# Patient Record
Sex: Male | Born: 1940 | Race: White | Hispanic: No | Smoking: Never smoker
Health system: Southern US, Community
[De-identification: ages and names within clinical notes are randomized; demographics above are authoritative.]

## PROBLEM LIST (undated history)

## (undated) DIAGNOSIS — E7800 Pure hypercholesterolemia, unspecified: Secondary | ICD-10-CM

## (undated) DIAGNOSIS — H409 Unspecified glaucoma: Secondary | ICD-10-CM

## (undated) DIAGNOSIS — I10 Essential (primary) hypertension: Secondary | ICD-10-CM

## (undated) HISTORY — PX: WRIST FRACTURE SURGERY: SHX121

---

## 2020-08-19 ENCOUNTER — Inpatient Hospital Stay: Payer: Self-pay

## 2020-08-19 ENCOUNTER — Other Ambulatory Visit: Payer: Self-pay

## 2020-08-19 ENCOUNTER — Inpatient Hospital Stay (HOSPITAL_COMMUNITY)
Admission: AD | Admit: 2020-08-19 | Discharge: 2020-08-30 | DRG: 235 | Disposition: A | Discharge status: Home / Self Care | Payer: Medicare Other | Source: Other Acute Inpatient Hospital | Attending: Surgery | Admitting: Surgery

## 2020-08-19 ENCOUNTER — Inpatient Hospital Stay (HOSPITAL_COMMUNITY): Payer: Medicare Other

## 2020-08-19 ENCOUNTER — Encounter (HOSPITAL_COMMUNITY): Payer: Self-pay | Admitting: Cardiology

## 2020-08-19 DIAGNOSIS — D62 Acute posthemorrhagic anemia: Secondary | ICD-10-CM | POA: Diagnosis not present

## 2020-08-19 DIAGNOSIS — Z681 Body mass index (BMI) 19 or less, adult: Secondary | ICD-10-CM | POA: Diagnosis not present

## 2020-08-19 DIAGNOSIS — I5020 Unspecified systolic (congestive) heart failure: Secondary | ICD-10-CM | POA: Diagnosis not present

## 2020-08-19 DIAGNOSIS — H409 Unspecified glaucoma: Secondary | ICD-10-CM | POA: Diagnosis present

## 2020-08-19 DIAGNOSIS — R110 Nausea: Secondary | ICD-10-CM | POA: Diagnosis not present

## 2020-08-19 DIAGNOSIS — E785 Hyperlipidemia, unspecified: Secondary | ICD-10-CM | POA: Diagnosis present

## 2020-08-19 DIAGNOSIS — I110 Hypertensive heart disease with heart failure: Secondary | ICD-10-CM | POA: Diagnosis present

## 2020-08-19 DIAGNOSIS — I2584 Coronary atherosclerosis due to calcified coronary lesion: Secondary | ICD-10-CM | POA: Diagnosis present

## 2020-08-19 DIAGNOSIS — N400 Enlarged prostate without lower urinary tract symptoms: Secondary | ICD-10-CM | POA: Diagnosis present

## 2020-08-19 DIAGNOSIS — Z01810 Encounter for preprocedural cardiovascular examination: Secondary | ICD-10-CM | POA: Diagnosis not present

## 2020-08-19 DIAGNOSIS — I25110 Atherosclerotic heart disease of native coronary artery with unstable angina pectoris: Principal | ICD-10-CM

## 2020-08-19 DIAGNOSIS — Z4682 Encounter for fitting and adjustment of non-vascular catheter: Secondary | ICD-10-CM

## 2020-08-19 DIAGNOSIS — K5900 Constipation, unspecified: Secondary | ICD-10-CM | POA: Diagnosis not present

## 2020-08-19 DIAGNOSIS — M96831 Postprocedural hemorrhage and hematoma of a musculoskeletal structure following other procedure: Secondary | ICD-10-CM | POA: Diagnosis not present

## 2020-08-19 DIAGNOSIS — R0902 Hypoxemia: Secondary | ICD-10-CM | POA: Diagnosis present

## 2020-08-19 DIAGNOSIS — I509 Heart failure, unspecified: Secondary | ICD-10-CM

## 2020-08-19 DIAGNOSIS — E119 Type 2 diabetes mellitus without complications: Secondary | ICD-10-CM | POA: Diagnosis present

## 2020-08-19 DIAGNOSIS — T8089XA Other complications following infusion, transfusion and therapeutic injection, initial encounter: Secondary | ICD-10-CM | POA: Diagnosis not present

## 2020-08-19 DIAGNOSIS — E440 Moderate protein-calorie malnutrition: Secondary | ICD-10-CM | POA: Diagnosis present

## 2020-08-19 DIAGNOSIS — J9383 Other pneumothorax: Secondary | ICD-10-CM | POA: Diagnosis not present

## 2020-08-19 DIAGNOSIS — Z951 Presence of aortocoronary bypass graft: Principal | ICD-10-CM

## 2020-08-19 DIAGNOSIS — I5041 Acute combined systolic (congestive) and diastolic (congestive) heart failure: Secondary | ICD-10-CM | POA: Diagnosis present

## 2020-08-19 DIAGNOSIS — I9589 Other hypotension: Secondary | ICD-10-CM | POA: Diagnosis not present

## 2020-08-19 DIAGNOSIS — I2510 Atherosclerotic heart disease of native coronary artery without angina pectoris: Secondary | ICD-10-CM

## 2020-08-19 DIAGNOSIS — Y832 Surgical operation with anastomosis, bypass or graft as the cause of abnormal reaction of the patient, or of later complication, without mention of misadventure at the time of the procedure: Secondary | ICD-10-CM | POA: Diagnosis not present

## 2020-08-19 DIAGNOSIS — I4891 Unspecified atrial fibrillation: Secondary | ICD-10-CM | POA: Diagnosis not present

## 2020-08-19 DIAGNOSIS — J939 Pneumothorax, unspecified: Secondary | ICD-10-CM

## 2020-08-19 DIAGNOSIS — Z79899 Other long term (current) drug therapy: Secondary | ICD-10-CM

## 2020-08-19 DIAGNOSIS — I255 Ischemic cardiomyopathy: Secondary | ICD-10-CM | POA: Diagnosis present

## 2020-08-19 DIAGNOSIS — J90 Pleural effusion, not elsewhere classified: Secondary | ICD-10-CM | POA: Diagnosis not present

## 2020-08-19 DIAGNOSIS — Z9889 Other specified postprocedural states: Secondary | ICD-10-CM

## 2020-08-19 DIAGNOSIS — E7800 Pure hypercholesterolemia, unspecified: Secondary | ICD-10-CM | POA: Diagnosis present

## 2020-08-19 DIAGNOSIS — I97611 Postprocedural hemorrhage and hematoma of a circulatory system organ or structure following cardiac bypass: Secondary | ICD-10-CM | POA: Diagnosis not present

## 2020-08-19 DIAGNOSIS — R0602 Shortness of breath: Principal | ICD-10-CM

## 2020-08-19 HISTORY — DX: Essential (primary) hypertension: I10

## 2020-08-19 HISTORY — DX: Pure hypercholesterolemia, unspecified: E78.00

## 2020-08-19 HISTORY — DX: Unspecified glaucoma: H40.9

## 2020-08-19 HISTORY — PX: CARDIAC CATHETERIZATION: SHX172

## 2020-08-19 LAB — CBC WITH DIFFERENTIAL/PLATELET
Abs Immature Granulocytes: 0.04 10*3/uL (ref 0.00–0.07)
Basophils Absolute: 0 10*3/uL (ref 0.0–0.1)
Basophils Relative: 0 %
Eosinophils Absolute: 0.2 10*3/uL (ref 0.0–0.5)
Eosinophils Relative: 2 %
HCT: 44.9 % (ref 39.0–52.0)
Hemoglobin: 14.6 g/dL (ref 13.0–17.0)
Immature Granulocytes: 0 %
Lymphocytes Relative: 18 %
Lymphs Abs: 2.1 10*3/uL (ref 0.7–4.0)
MCH: 31.7 pg (ref 26.0–34.0)
MCHC: 32.5 g/dL (ref 30.0–36.0)
MCV: 97.6 fL (ref 80.0–100.0)
Monocytes Absolute: 1 10*3/uL (ref 0.1–1.0)
Monocytes Relative: 9 %
Neutro Abs: 8.6 10*3/uL — ABNORMAL HIGH (ref 1.7–7.7)
Neutrophils Relative %: 71 %
Platelets: 250 10*3/uL (ref 150–400)
RBC: 4.6 MIL/uL (ref 4.22–5.81)
RDW: 13.6 % (ref 11.5–15.5)
WBC: 12.1 10*3/uL — ABNORMAL HIGH (ref 4.0–10.5)
nRBC: 0 % (ref 0.0–0.2)

## 2020-08-19 LAB — COMPREHENSIVE METABOLIC PANEL
ALT: 20 U/L (ref 0–44)
AST: 17 U/L (ref 15–41)
Albumin: 3 g/dL — ABNORMAL LOW (ref 3.5–5.0)
Alkaline Phosphatase: 53 U/L (ref 38–126)
Anion gap: 10 (ref 5–15)
BUN: 31 mg/dL — ABNORMAL HIGH (ref 8–23)
CO2: 27 mmol/L (ref 22–32)
Calcium: 8.9 mg/dL (ref 8.9–10.3)
Chloride: 99 mmol/L (ref 98–111)
Creatinine, Ser: 1.21 mg/dL (ref 0.61–1.24)
GFR, Estimated: >60 mL/min (ref >60)
Glucose, Bld: 117 mg/dL — ABNORMAL HIGH (ref 70–99)
Potassium: 3.6 mmol/L (ref 3.5–5.1)
Sodium: 136 mmol/L (ref 135–145)
Total Bilirubin: 0.8 mg/dL (ref 0.3–1.2)
Total Protein: 6.4 g/dL — ABNORMAL LOW (ref 6.5–8.1)

## 2020-08-19 LAB — LIPID PANEL
Cholesterol: 105 mg/dL (ref 0–200)
HDL: 41 mg/dL (ref >40)
LDL Cholesterol: 48 mg/dL (ref 0–99)
Total CHOL/HDL Ratio: 2.6 RATIO
Triglycerides: 82 mg/dL (ref <150)
VLDL: 16 mg/dL (ref 0–40)

## 2020-08-19 LAB — ECHOCARDIOGRAM COMPLETE
AR max vel: 3.02 cm2
AV Area VTI: 3.09 cm2
AV Area mean vel: 2.71 cm2
AV Mean grad: 2 mmHg
AV Peak grad: 3.2 mmHg
Ao pk vel: 0.9 m/s
Area-P 1/2: 4.49 cm2
Calc EF: 22.9 %
Height: 71 in
S' Lateral: 3.3 cm
Single Plane A2C EF: 24.8 %
Single Plane A4C EF: 18.8 %
Weight: 2284.8 oz

## 2020-08-19 LAB — PROTIME-INR
INR: 1.1 (ref 0.8–1.2)
Prothrombin Time: 13.7 seconds (ref 11.4–15.2)

## 2020-08-19 LAB — BRAIN NATRIURETIC PEPTIDE: B Natriuretic Peptide: 1109.1 pg/mL — ABNORMAL HIGH (ref 0.0–100.0)

## 2020-08-19 LAB — HEPARIN LEVEL (UNFRACTIONATED)
Heparin Unfractionated: <0.1 IU/mL — ABNORMAL LOW (ref 0.30–0.70)
Heparin Unfractionated: 0.19 IU/mL — ABNORMAL LOW (ref 0.30–0.70)

## 2020-08-19 LAB — HEMOGLOBIN A1C
Hgb A1c MFr Bld: 6.2 % — ABNORMAL HIGH (ref 4.8–5.6)
Mean Plasma Glucose: 131.24 mg/dL

## 2020-08-19 LAB — APTT: aPTT: 45 seconds — ABNORMAL HIGH (ref 24–36)

## 2020-08-19 LAB — TSH: TSH: 3.177 u[IU]/mL (ref 0.350–4.500)

## 2020-08-19 LAB — MAGNESIUM: Magnesium: 1.8 mg/dL (ref 1.7–2.4)

## 2020-08-19 MED ORDER — GADOBUTROL 1 MMOL/ML IV SOLN
7.0000 mL | Freq: Once | INTRAVENOUS | Status: AC | PRN
  Administered 2020-08-19: 7 mL via INTRAVENOUS

## 2020-08-19 MED ORDER — ASPIRIN 300 MG RE SUPP: 300.0000 mg | RECTAL | Status: DC

## 2020-08-19 MED ORDER — CHLORHEXIDINE GLUCONATE CLOTH 2 % EX PADS: 6.0000 | MEDICATED_PAD | Freq: Every day | CUTANEOUS | Status: DC

## 2020-08-19 MED ORDER — CARVEDILOL 6.25 MG PO TABS
6.2500 mg | ORAL_TABLET | Freq: Two times a day (BID) | ORAL | Status: DC
  Administered 2020-08-19 – 2020-08-22 (×7): 6.25 mg via ORAL
  Filled 2020-08-19 (×8): qty 1

## 2020-08-19 MED ORDER — ASPIRIN EC 81 MG PO TBEC
81.0000 mg | DELAYED_RELEASE_TABLET | Freq: Every day | ORAL | Status: DC
  Administered 2020-08-20 – 2020-08-22 (×3): 81 mg via ORAL
  Filled 2020-08-19 (×3): qty 1

## 2020-08-19 MED ORDER — ATORVASTATIN CALCIUM 40 MG PO TABS: 40.0000 mg | ORAL_TABLET | Freq: Every day | ORAL | Status: DC

## 2020-08-19 MED ORDER — ROSUVASTATIN CALCIUM 20 MG PO TABS
40.0000 mg | ORAL_TABLET | Freq: Every day | ORAL | Status: DC
  Administered 2020-08-19 – 2020-08-22 (×4): 40 mg via ORAL
  Filled 2020-08-19 (×4): qty 2

## 2020-08-19 MED ORDER — FINASTERIDE 5 MG PO TABS
5.0000 mg | ORAL_TABLET | Freq: Every day | ORAL | Status: DC
  Administered 2020-08-20 – 2020-08-30 (×10): 5 mg via ORAL
  Filled 2020-08-19 (×12): qty 1

## 2020-08-19 MED ORDER — ENSURE ENLIVE PO LIQD
237.0000 mL | Freq: Two times a day (BID) | ORAL | Status: DC
  Administered 2020-08-20 – 2020-08-26 (×6): 237 mL via ORAL
  Filled 2020-08-19: qty 237

## 2020-08-19 MED ORDER — FUROSEMIDE 10 MG/ML IJ SOLN
40.0000 mg | Freq: Every day | INTRAMUSCULAR | Status: DC
  Administered 2020-08-19: 40 mg via INTRAVENOUS
  Filled 2020-08-19: qty 4

## 2020-08-19 MED ORDER — NITROGLYCERIN 0.4 MG SL SUBL: 0.4000 mg | SUBLINGUAL_TABLET | SUBLINGUAL | Status: DC | PRN

## 2020-08-19 MED ORDER — ONDANSETRON HCL 4 MG/2ML IJ SOLN: 4.0000 mg | Freq: Four times a day (QID) | INTRAMUSCULAR | Status: DC | PRN

## 2020-08-19 MED ORDER — ASPIRIN 81 MG PO CHEW: 324.0000 mg | CHEWABLE_TABLET | ORAL | Status: DC

## 2020-08-19 MED ORDER — PERFLUTREN LIPID MICROSPHERE
1.0000 mL | INTRAVENOUS | Status: AC | PRN
  Administered 2020-08-19: 2 mL via INTRAVENOUS
  Filled 2020-08-19: qty 10

## 2020-08-19 MED ORDER — HEPARIN (PORCINE) 25000 UT/250ML-% IV SOLN
1400.0000 [IU]/h | INTRAVENOUS | Status: DC
  Administered 2020-08-20 – 2020-08-22 (×4): 1400 [IU]/h via INTRAVENOUS
  Filled 2020-08-19 (×8): qty 250

## 2020-08-19 MED ORDER — ACETAMINOPHEN 325 MG PO TABS
650.0000 mg | ORAL_TABLET | ORAL | Status: DC | PRN
  Filled 2020-08-19: qty 2

## 2020-08-19 MED ORDER — POTASSIUM CHLORIDE CRYS ER 20 MEQ PO TBCR
40.0000 meq | EXTENDED_RELEASE_TABLET | Freq: Once | ORAL | Status: AC
  Administered 2020-08-19: 40 meq via ORAL
  Filled 2020-08-19: qty 2

## 2020-08-19 MED ORDER — ADULT MULTIVITAMIN W/MINERALS CH
1.0000 | ORAL_TABLET | Freq: Every day | ORAL | Status: DC
  Administered 2020-08-19 – 2020-08-30 (×11): 1 via ORAL
  Filled 2020-08-19 (×11): qty 1

## 2020-08-19 NOTE — Progress Notes (Addendum)
ANTICOAGULATION CONSULT NOTE  Pharmacy Consult for heparin Indication: CAD awaiting TCTS consult  No Known Allergies  Patient Measurements: Height: 5\' 11"  (180.3 cm) Weight: 64.8 kg (142 lb 12.8 oz) IBW/kg (Calculated) : 75.3  Medical History: Past Medical History:  Diagnosis Date  . Glaucoma   . High blood pressure   . High cholesterol     Assessment: 80yo male had presented to OSH on 3/2 for h/o CP since Dec and hypoxia noted on outpt OV, subsequently had cardiac cath revealing 3VD, now tx'd to [facility] for CABG consultation, to continue heparin  -heparin level = 0.19  Goal of Therapy:  Heparin level 0.3-0.7 units/ml Monitor platelets by anticoagulation protocol: Yes   Plan:  -Increase heparin to 1400 units/hr -Heparin level in 8 hours and daily wth CBC daily  [facility], PharmD Clinical Pharmacist **Pharmacist phone directory can now be found on [email or web address] ([email or web address]).  Listed under [facility].

## 2020-08-19 NOTE — Progress Notes (Addendum)
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  GOAL:   Patient will meet greater than or equal to 90% of their needs  MONITOR:   PO intake,Supplement acceptance  REASON FOR ASSESSMENT:   Malnutrition Screening Tool    ASSESSMENT:   80 yo male admitted with CAD with MVD and ADHF. PMH includes BPH, HTN, HLD, glaucoma.   Patient transferred to [facility] from [facility] in [place] for CABG evaluation.  Patient reports that he eats 3 meals per day and has had a good appetite at home. He has been losing weight, unsure amount, over the past 3-4 months related to acute cardiac issues he has been having. He is willing to try an Ensure, but not sure he will like it. Discussed the importance of adequate nutrition to support healing/recovery and maintenance of LBM.  Diet just advanced to heart healthy this afternoon. Patient states he is hungry.   Labs reviewed.   Medications reviewed and include Lasix, KCl.  No weight history available for review.     NUTRITION - FOCUSED PHYSICAL EXAM:  Flowsheet Row Most Recent Value  Orbital Region Mild depletion  Upper Arm Region Mild depletion  Thoracic and Lumbar Region Mild depletion  Buccal Region Mild depletion  Temple Region Mild depletion  Clavicle Bone Region Severe depletion  Clavicle and Acromion Bone Region Moderate depletion  Scapular Bone Region Moderate depletion  Dorsal Hand Mild depletion  Patellar Region Moderate depletion  Anterior Thigh Region Moderate depletion  Posterior Calf Region Moderate depletion  Edema (RD Assessment) Mild  Hair Reviewed  Eyes Reviewed   Mouth Reviewed  Skin Reviewed  Nails Reviewed       Diet Order:   Diet Order            Diet Heart Room service appropriate? Yes; Fluid consistency: Thin  Diet effective now                 EDUCATION NEEDS:   Not appropriate for education at this time  Skin:  Skin Assessment: Reviewed RN Assessment  Last BM:  no BM documented  Height:   Ht Readings from Last 1 Encounters:  08/19/20 5\' 11"  (1.803 m)    Weight:   Wt Readings from Last 1 Encounters:  08/19/20 64.8 kg    Ideal Body Weight:  78.2 kg  BMI:  Body mass index is 19.92 kg/m.  Estimated Nutritional Needs:   Kcal:  2000-2200  Protein:  90-110 gm  Fluid:  1.8-2 L    10/19/20, RD, LDN, CNSC Please refer to Amion for contact information.

## 2020-08-19 NOTE — Progress Notes (Signed)
  Echocardiogram 2D Echocardiogram has been performed.  Roosvelt Maser F 08/19/2020, 2:31 PM

## 2020-08-19 NOTE — H&P (Addendum)
CARDIOLOGY ADMISSION NOTE  Patient ID: Paul [name] MRN: [id number] DOB/AGE: 08/22/1940 80 y.o.  Admit date: 08/19/2020 Primary Physician   Dr. Donato Schultz Primary Cardiologist   No primary care provider on file. Chief Complaint    CAD w/ MVD and ADHF  HPI:  Paul [name] is a 80 yo CM w/ PMH significant for BPH and HTN admitted to [facility] on 08/14/20- 08/18/2020 for hypoxia, ADHF, new HFrEF and MVD. He is subsequently transferred to [facility] for evaluation for CABG.   Paul [name] stated he experienced chest pressure like symptoms, radiated to neck, 8/10 in late December he ignored his symptoms hoping they would resolve spontaneously. His chest discomfort subsided after a week but he started to notice progressive SOB and hypoxia which he also continued to ignore and modify his lifestyle to deal with them. He had a visit with his PCP recently when he was noted to be hypoxic in the 70s prompting hospital admission to [facility] in [place]. Patient was initially found to be in ADHF with BNP 1255, Trop peak 183. Echo w/ EF of 30-35%. CTA chest on admission was negative for PE but showed pulmonary edema and b/l pleural effusions. He was COVID negative on 08/14/2020. Patient was diuresed aggressively and he was taken for LHC/CA on 08/18/2020 and noted dLM 50%, LAD 75-80%, mLAD subtotal occlusion, ostial D2 80%, mLCX 99%, pOM1 75% and mRCA 99% disease. He was subsequently transferred to [facility] on a heparin drip for consideration of CABG.   At the time of my examination patient denies any chest pressure/pain, lightheadedness, SOB, dizziness, syncope, orthopnea, PND or LE swelling.   EKG:  Pending  Echo: 08/15/2020 - Normal LV size with severely decreased systolic function. The EF is 30-35%. LV filling c/w restrictive pattern (Grade 3) diastolic dysfunction. There is normal LV wall thickness.  - Normal RV size with normal function - There is mild aortic regurgitation - There is  moderate tricuspid regurgitation - Severely increased pulmonary artery systolic pressure (PASP ). No thrombus is seen but at the akinetic apex there is swirling of blood flow and "smoke" is seen. This represents fibrin degradation products and platelets which are clot precursors  LHC/CA: 08/18/2020 Hemodynamics: Aortic pressure 91/46, LV pressure 91, LVEDP LM: distal 50% stenosis LAD: proximal 75-80% stenosis. Mid LAD subtotal stenosis with faint filling, finally TIMI-2 filling of distal LAD. - Diagonal 1 25-30% stenosis, not a large vessel - Diagonal 2 ostial 8% stenosis LCX: mid vessel with 99% stenosis with ulceration - OM1: proximal 75% stenosis - OM2: small vessel RCA: dominant vessel. Mid RCA with 99% stenosis, jump bridging collaterals which were right -right collaterals. RCA collateralized LAD   Impression:  3-v disease, severe Heavily calcified coronary arteries Depressed LV function. EF 30-35%    Past Medical History:  Diagnosis Date  . Glaucoma   . High blood pressure   . High cholesterol     Past Surgical History:  Procedure Laterality Date  . CARDIAC CATHETERIZATION  08/19/2020  . WRIST FRACTURE SURGERY Right    "years ago"    Allergies  Allergen Reactions  . No Known Allergies    No current facility-administered medications on file prior to encounter.   No current outpatient medications on file prior to encounter.   Social History   Socioeconomic History  . Marital status: Widowed    Spouse name: Not on file  . Number of children: Not on file  . Years of education: Not  on file  . Highest education level: Not on file  Occupational History  . Not on file  Tobacco Use  . Smoking status: Never Smoker  . Smokeless tobacco: Never Used  Substance and Sexual Activity  . Alcohol use: Not Currently  . Drug use: Never  . Sexual activity: Not on file  Other Topics Concern  . Not on file  Social History Narrative  . Not on file   Social  Determinants of Health   Financial Resource Strain: Not on file  Food Insecurity: Not on file  Transportation Needs: Not on file  Physical Activity: Not on file  Stress: Not on file  Social Connections: Not on file  Intimate Partner Violence: Not on file    No family history on file.   Review of Systems: [y] = yes, [ ]  = no       General: Weight gain [] ; Weight loss [ ] ; Anorexia [ ] ; Fatigue [ ] ; Fever [ ] ; Chills [ ] ; Weakness [ ]     Cardiac: Chest pain/pressure [ ] ; Resting SOB [ y]; Exertional SOB [ ] ; Orthopnea [ ] ; Pedal Edema [ ] ; Palpitations [ ] ; Syncope [ ] ; Presyncope [ ] ; Paroxysmal nocturnal dyspnea[ ]     Pulmonary: Cough [ ] ; Wheezing[ ] ; Hemoptysis[ ] ; Sputum [ ] ; Snoring [ ]     GI: Vomiting[ ] ; Dysphagia[ ] ; Melena[ ] ; Hematochezia [ ] ; Heartburn[ ] ; Abdominal pain [ ] ; Constipation [ ] ; Diarrhea [ ] ; BRBPR [ ]     GU: Hematuria[ ] ; Dysuria [ ] ; Nocturia[ ]   Vascular: Pain in legs with walking [ ] ; Pain in feet with lying flat [ ] ; Non-healing sores [ ] ; Stroke [ ] ; TIA [ ] ; Slurred speech [ ] ;    Neuro: Headaches[ ] ; Vertigo[ ] ; Seizures[ ] ; Paresthesias[ ] ;Blurred vision [ ] ; Diplopia [ ] ; Vision changes [ ]     Ortho/Skin: Arthritis [ ] ; Joint pain [ ] ; Muscle pain [ ] ; Joint swelling [ ] ; Back Pain [ ] ; Rash [ ]     Psych: Depression[ ] ; Anxiety[ ]     Heme: Bleeding problems [ ] ; Clotting disorders [ ] ; Anemia [ ]     Endocrine: Diabetes [ ] ; Thyroid dysfunction[ ]   Physical Exam: Height 5\' 11"  (1.803 m), weight 64.8 kg.   GENERAL: Patient is afebrile, Vital signs reviewed, Well appearing, Patient appears comfortable, Alert and lucid. EYES: Normal inspection. HEENT:  normocephalic, atraumatic , normal ENT inspection. ORAL:  Moist NECK:  supple , normal inspection. CARD:  regular rate and rhythm, heart sounds normal. RESP:  no respiratory distress, breath sounds normal. ABD: soft, tender to palpation, BS present, soft, no organomegaly or masses . BACK:  non-tender. No CVA tenderness. MUSC:  normal ROM, non-tender , no pedal edema . SKIN: color normal, no rash, warm, dry . NEURO: awake & alert, lucid, no motor/sensory deficit. Gait stable. PSYCH: mood/affect normal.   Labs: No results found for: BUN No results found for: CREATININE No results found for: NA, K, CL, CO2 No results found for: TROPONINI No results found for: WBC, HGB, HCT, MCV, PLT No results found for: CHOL, HDL, LDLCALC, LDLDIRECT, TRIG, CHOLHDL No results found for: ALT, AST, GGT, ALKPHOS, BILITOT    Radiology:  CXR:  Pending   ASSESSMENT/ PLAN: Paul [name] is a 80 yo CM w/ PMH significant for BPH and HTN admitted to [facility] on 08/14/20- 08/18/2020 for hypoxia, ADHF, new HFrEF and multivessel CAD is transferred to [facility] for CABG consideration.   1.  Multivessel CAD:  - LHC/CA on 08/18/2020 and noted dLM 50%, LAD 75-80%, mLAD subtotal occlusion, ostial D2 80%, mLCX 99%, pOM1 75% and mRCA 99% disease. - Patient had inciting cardiac event late December, 2021. He has severely calcified coronary arteries as per cath report. Ordered Cardiac MRI for viability assessment prior to CABG.  - CTS consultation post MRI - continue heparin gtt - ASA 81mg  daily - coreg 6.25mg  BID - SL nitroglycerine - Crestor 40mg  daily - cardiac rehab at time of discharge - obtain EKG  2. Ischemic HFrEF, systolic and diastolic HF (EF ) - NYHA Class C, Stage II, warm - Lasix: continue Lasix40 IV daily (patient is Lasix naiive prior to this hospitalization)  > strict I/O, daily weights, low NaCl diet - Beta Blocker: continue Coreg 6.25mg BID - ACE/ARB/ARNI: we will consider Entresto this admission - MRA: we will consider Aldactone this admission - SGLT2 inhibitor: consider prior to discharge - obtain repeat Echo - ICD consideration to be discussed later based on revasc options and myocardial viability  3. Hypoxia - secondary to HF - CTA noted b/l pleural  effusions - CXR ordered - Continue diuresis  Signed: Rupesh Manam 08/19/2020, 3:43 AM   Patient seen and examined. Agree with assessment and plan.  Paul [name] is a 80 year old gentleman who admits to a 70-month history of episodic exertionally precipitated chest discomfort discomfort.  When this time he did not seek evaluation but recently began to notice progressive increase in exertional dyspnea.  He had recently seen his primary care provider and was hypoxic prompting hospitalization at [facility] in [place].  BNP was 1255.  Troponin I 83.  An echo Doppler study showed an EF of 30 to 35%.  He was felt to have pulmonary edema and bilateral pleural effusions.  He underwent cardiac catheterization and his images were recently sent to [facility] for our review.  I personally reviewed his cardiac catheterization findings which demonstrates severe coronary calcification and severe multivessel CAD with  left main disease, subtotal mid LAD with TIMI II flow, ostial diagonal, high-grade circumflex stenoses and subtotal RCA disease with sluggish flow distally.  He has been heparinized and was transferred to [facility].  Presently he is pain-free.  He just underwent cardiac MRI which has not processed and reviewed to assess for LV viability.  A left ventriculography had severe hypocontractility with apical ballooning.  A 2D echo Doppler study was just completed.  At present, blood pressure is stable, heart rate at 90 with 95% O2 saturation.  There is no JVD.  He has decreased breath sounds bases.  Rhythm is regular with a 1/6 systolic murmur.  There is no rub or S3 gallop.  There is soft nontender.  Distal pulses are adequate.  Neurologic exam is nonfocal.  He has normal mood and affect.  An echo Doppler study was just completed; results pending.   Will obtain surgical consultation once MRI results are available and if significant viability CABG revascularization.  Will increase carvedilol to 9.375 mg twice a  day.  Will initiate losartan 12.5 mg and titrate, continue Lasix and consider addition of spironolactone as blood pressure allows.  [place], MD, [facility] 08/19/2020 2:30 PM

## 2020-08-19 NOTE — Plan of Care (Signed)

## 2020-08-19 NOTE — Progress Notes (Addendum)
ANTICOAGULATION CONSULT NOTE - Initial Consult  Pharmacy Consult for heparin Indication: CAD awaiting TCTS consult  Allergies  Allergen Reactions  . No Known Allergies     Patient Measurements: Height: 5\' 11"  (180.3 cm) Weight: 64.8 kg (142 lb 12.8 oz) IBW/kg (Calculated) : 75.3  Medical History: Past Medical History:  Diagnosis Date  . Glaucoma   . High blood pressure   . High cholesterol     Assessment: 80yo male had presented to OSH on 3/2 for h/o CP since Dec and hypoxia noted on outpt OV, subsequently had cardiac cath revealing 3VD, now tx'd to [facility] for CABG consultation, to continue heparin started at OSH. UFH was started in cath lab at OSH at 1000 units/hr; heparin level 6h later was therapeutic at 0.3 with no change to infusion; pt arrived to [facility] on heparin 1000 units/hr without interruption.  Pertinent labs from [facility]: SCr 1.19, Hgb 16.2, Plt 258, INR 1.1  Goal of Therapy:  Heparin level 0.3-0.7 units/ml Monitor platelets by anticoagulation protocol: Yes   Plan:  Will continue heparin gtt at 1000 units/hr and monitor heparin levels and CBC.  [facility], PharmD, BCPS  08/19/2020,4:13 AM   Addendum: Heparin level this am is undetectable.  Will increase heparin gtt by 4 units/kg/hr to 1250 units/hr and check level in 8hr.  VB 4:57 AM

## 2020-08-20 DIAGNOSIS — I509 Heart failure, unspecified: Secondary | ICD-10-CM

## 2020-08-20 DIAGNOSIS — I25110 Atherosclerotic heart disease of native coronary artery with unstable angina pectoris: Secondary | ICD-10-CM

## 2020-08-20 DIAGNOSIS — I255 Ischemic cardiomyopathy: Secondary | ICD-10-CM | POA: Diagnosis not present

## 2020-08-20 DIAGNOSIS — J90 Pleural effusion, not elsewhere classified: Secondary | ICD-10-CM

## 2020-08-20 DIAGNOSIS — E785 Hyperlipidemia, unspecified: Secondary | ICD-10-CM | POA: Diagnosis not present

## 2020-08-20 LAB — BASIC METABOLIC PANEL
Anion gap: 7 (ref 5–15)
BUN: 28 mg/dL — ABNORMAL HIGH (ref 8–23)
CO2: 31 mmol/L (ref 22–32)
Calcium: 8.9 mg/dL (ref 8.9–10.3)
Chloride: 100 mmol/L (ref 98–111)
Creatinine, Ser: 1.24 mg/dL (ref 0.61–1.24)
GFR, Estimated: 59 mL/min — ABNORMAL LOW (ref >60)
Glucose, Bld: 150 mg/dL — ABNORMAL HIGH (ref 70–99)
Potassium: 4.1 mmol/L (ref 3.5–5.1)
Sodium: 138 mmol/L (ref 135–145)

## 2020-08-20 LAB — CBC
HCT: 43.6 % (ref 39.0–52.0)
Hemoglobin: 14.6 g/dL (ref 13.0–17.0)
MCH: 32.4 pg (ref 26.0–34.0)
MCHC: 33.5 g/dL (ref 30.0–36.0)
MCV: 96.7 fL (ref 80.0–100.0)
Platelets: 228 10*3/uL (ref 150–400)
RBC: 4.51 MIL/uL (ref 4.22–5.81)
RDW: 13.7 % (ref 11.5–15.5)
WBC: 8.6 10*3/uL (ref 4.0–10.5)
nRBC: 0 % (ref 0.0–0.2)

## 2020-08-20 LAB — HEPARIN LEVEL (UNFRACTIONATED): Heparin Unfractionated: 0.49 IU/mL (ref 0.30–0.70)

## 2020-08-20 MED ORDER — ISOSORBIDE MONONITRATE ER 30 MG PO TB24
15.0000 mg | ORAL_TABLET | Freq: Every day | ORAL | Status: DC
  Administered 2020-08-20 – 2020-08-22 (×3): 15 mg via ORAL
  Filled 2020-08-20 (×3): qty 1

## 2020-08-20 MED ORDER — LOSARTAN POTASSIUM 25 MG PO TABS
12.5000 mg | ORAL_TABLET | Freq: Every day | ORAL | Status: DC
  Administered 2020-08-20 – 2020-08-22 (×3): 12.5 mg via ORAL
  Filled 2020-08-20 (×3): qty 1

## 2020-08-20 MED ORDER — FUROSEMIDE 20 MG PO TABS
20.0000 mg | ORAL_TABLET | Freq: Every day | ORAL | Status: DC
  Administered 2020-08-20 – 2020-08-22 (×3): 20 mg via ORAL
  Filled 2020-08-20 (×3): qty 1

## 2020-08-20 NOTE — Progress Notes (Signed)
ANTICOAGULATION CONSULT NOTE  Pharmacy Consult for heparin Indication: CAD awaiting TCTS consult  Assessment: 80yo male had presented to OSH on 3/2 for h/o CP since Dec and hypoxia noted on outpt OV, subsequently had cardiac cath revealing 3VD, now tx'd to [facility] for CABG consultation, to continue heparin  -heparin level = 0.49 units/ml  Goal of Therapy:  Heparin level 0.3-0.7 units/ml Monitor platelets by anticoagulation protocol: Yes   Plan:  -Continue heparin at 1400 units/hr -Heparin level and CBC daily  Thanks for allowing pharmacy to be a part of this patient's care.  Talbert Cage, PharmD Clinical Pharmacist

## 2020-08-20 NOTE — Plan of Care (Signed)

## 2020-08-20 NOTE — Consult Note (Signed)
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 Tobey [name]  Graciano Jannette [name] is an 80 y.o. male.  HPI:   The patient is a 80 year old gentleman from [place], [place] with a history of hypertension and hyperlipidemia who was admitted at [facility] from 08/14/2020 to 08/18/2020 with a 758-month history of progressive exertional shortness of breath and substernal chest discomfort. He began developing chest pressure that radiated to his neck in late December but he ignored the symptoms and thought they would go away. His chest discomfort improved after a week or so but he began developing progressive shortness of breath and LE edema. He saw his PCP and was noted to have sats in the 70's and was admitted to [facility]. He was found to be in acute decompensated heart failure with a BNP of 1255, trop peak of 183. EF was 30-35% by echo. Chest CT was negative for PE but showed pulm edema and bilateral pleural effusions. He was diuresed and had cath on 08/18/20 showing 50% distal LM, 75-80% proximal LAD, mLAD subtotal occlusion, 99% mid LCX with 75% OM1, 80% OM2 and 99% mRCA. He was [facility] to [facility]. He feels much better since being diuresed and denies any symptoms at this time.   His daughter is with him today. She lives in [place]. He lives alone in [place]. Wife is deceased. 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  Past Medical History:  Diagnosis Date  . Glaucoma   . High blood pressure   . High cholesterol     Past Surgical History:  Procedure Laterality Date  . CARDIAC CATHETERIZATION  08/19/2020  . WRIST FRACTURE SURGERY Right    "years ago"    No family history on file.  Social History:  reports that he has never smoked. He has never used smokeless  tobacco. He reports previous alcohol use. He reports that he does not use drugs.  Allergies: No Known Allergies  Medications:  I have reviewed the patient's current medications. Prior to Admission:  Medications Prior to Admission  Medication Sig Dispense Refill Last Dose  . amLODipine (NORVASC) 5 MG tablet Take 5 mg by mouth daily.   08/17/2020  . atorvastatin (LIPITOR) 10 MG tablet Take 10 mg by mouth daily.   08/17/2020  . finasteride (PROSCAR) 5 MG tablet Take 5 mg by mouth daily.   08/17/2020  . latanoprost (XALATAN) 0.005 % ophthalmic solution Place 1 drop into both eyes at bedtime.   08/17/2020  . lisinopril (ZESTRIL) 40 MG tablet Take 40 mg by mouth daily.   08/17/2020   Scheduled: . aspirin EC  81 mg Oral Daily  . carvedilol  6.25 mg Oral BID WC  . feeding supplement  237 mL Oral BID BM  . finasteride  5 mg Oral Daily  . furosemide  20 mg Oral Daily  . isosorbide mononitrate  15 mg Oral Daily  . losartan  12.5 mg Oral Daily  . multivitamin with minerals  1 tablet Oral Daily  . rosuvastatin  40 mg Oral Daily   Continuous: . heparin 1,400 Units/hr (08/20/20 0345)   SAY:[id number]:acetaminophen, nitroGLYCERIN, ondansetron (ZOFRAN) IV  Results for orders placed or performed  during the hospital encounter of 08/19/20 (from the past 48 hour(s))  Heparin level (unfractionated)     Status: Abnormal   Collection Time: 08/19/20  3:51 AM  Result Value Ref Range   Heparin Unfractionated <0.10 (L) 0.30 - 0.70 IU/mL    Comment: (NOTE) If heparin results are below expected values, and patient dosage has  been confirmed, suggest follow up testing of antithrombin III levels. Performed at [facility], [address] [place], [place] [address]   Comprehensive metabolic panel     Status: Abnormal   Collection Time: 08/19/20  3:58 AM  Result Value Ref Range   Sodium 136 135 - 145 mmol/L   Potassium 3.6 3.5 - 5.1 mmol/L   Chloride 99 98 - 111 mmol/L   CO2 27 22 - 32 mmol/L   Glucose, Bld 117 (H)  70 - 99 mg/dL    Comment: Glucose reference range applies only to samples taken after fasting for at least 8 hours.   BUN 31 (H) 8 - 23 mg/dL   Creatinine, Ser 4.09 0.61 - 1.24 mg/dL   Calcium 8.9 8.9 - 81.1 mg/dL   Total Protein 6.4 (L) 6.5 - 8.1 g/dL   Albumin 3.0 (L) 3.5 - 5.0 g/dL   AST 17 15 - 41 U/L   ALT 20 0 - 44 U/L   Alkaline Phosphatase 53 38 - 126 U/L   Total Bilirubin 0.8 0.3 - 1.2 mg/dL   GFR, Estimated >91 >47 mL/min    Comment: (NOTE) Calculated using the CKD-EPI Creatinine Equation (2021)    Anion gap 10 5 - 15    Comment: Performed at [facility], [address], [place], [place] [address]  Magnesium     Status: None   Collection Time: 08/19/20  3:58 AM  Result Value Ref Range   Magnesium 1.8 1.7 - 2.4 mg/dL    Comment: Performed at [facility], [address], [place], [place] [address]  TSH     Status: None   Collection Time: 08/19/20  3:58 AM  Result Value Ref Range   TSH 3.177 0.350 - 4.500 uIU/mL    Comment: Performed by a 3rd Generation assay with a functional sensitivity of <=0.01 uIU/mL. Performed at [facility], [address], [place], [place] [address]   Hemoglobin A1c     Status: Abnormal   Collection Time: 08/19/20  3:58 AM  Result Value Ref Range   Hgb A1c MFr Bld 6.2 (H) 4.8 - 5.6 %    Comment: (NOTE) Pre diabetes:          5.7%-6.4%  Diabetes:              >6.4%  Glycemic control for   <7.0% adults with diabetes    Mean Plasma Glucose 131.24 mg/dL    Comment: Performed at [facility], [address], [place], [place] [address]  Brain natriuretic peptide     Status: Abnormal   Collection Time: 08/19/20  3:58 AM  Result Value Ref Range   B Natriuretic Peptide 1,109.1 (H) 0.0 - 100.0 pg/mL    Comment: Performed at [facility], [address] [place], [place] [address]  CBC WITH DIFFERENTIAL     Status: Abnormal   Collection Time: 08/19/20  3:58 AM  Result Value Ref Range   WBC 12.1 (H) 4.0 - 10.5  K/uL   RBC 4.60 4.22 - 5.81 MIL/uL   Hemoglobin 14.6 13.0 -  17.0 g/dL   HCT 40.9 81.1 - 91.4 %   MCV 97.6 80.0 - 100.0 fL   MCH 31.7 26.0 - 34.0 pg   MCHC 32.5 30.0 - 36.0 g/dL   RDW 78.2 95.6 - 21.3 %   Platelets 250 150 - 400 K/uL   nRBC 0.0 0.0 - 0.2 %   Neutrophils Relative % 71 %   Neutro Abs 8.6 (H) 1.7 - 7.7 K/uL   Lymphocytes Relative 18 %   Lymphs Abs 2.1 0.7 - 4.0 K/uL   Monocytes Relative 9 %   Monocytes Absolute 1.0 0.1 - 1.0 K/uL   Eosinophils Relative 2 %   Eosinophils Absolute 0.2 0.0 - 0.5 K/uL   Basophils Relative 0 %   Basophils Absolute 0.0 0.0 - 0.1 K/uL   Immature Granulocytes 0 %   Abs Immature Granulocytes 0.04 0.00 - 0.07 K/uL    Comment: Performed at [facility], [address] [place], [place] [address]  Protime-INR     Status: None   Collection Time: 08/19/20  3:58 AM  Result Value Ref Range   Prothrombin Time 13.7 11.4 - 15.2 seconds   INR 1.1 0.8 - 1.2    Comment: (NOTE) INR goal varies based on device and disease states. Performed at [facility], [address] [place], [place] [address]   APTT     Status: Abnormal   Collection Time: 08/19/20  3:58 AM  Result Value Ref Range   aPTT 45 (H) 24 - 36 seconds    Comment:        IF BASELINE aPTT IS ELEVATED, SUGGEST PATIENT RISK ASSESSMENT BE USED TO DETERMINE APPROPRIATE ANTICOAGULANT THERAPY. Performed at [facility], [address], [place], [place] [address]   Lipid panel     Status: None   Collection Time: 08/19/20  3:58 AM  Result Value Ref Range   Cholesterol 105 0 - 200 mg/dL   Triglycerides 82 <413 mg/dL   HDL 41 >24 mg/dL   Total CHOL/HDL Ratio 2.6 RATIO   VLDL 16 0 - 40 mg/dL   LDL Cholesterol 48 0 - 99 mg/dL    Comment:        Total Cholesterol/HDL:CHD Risk Coronary Heart Disease Risk Table                     Men   Women  1/2 Average Risk   3.4   3.3  Average Risk       5.0   4.4  2 X Average Risk   9.6   7.1  3 X Average Risk  23.4   11.0         Use the calculated Patient Ratio above and the CHD Risk Table to determine the patient's CHD Risk.        ATP III CLASSIFICATION (LDL):  <100     mg/dL   Optimal  401-027  mg/dL   Near or Above                    Optimal  130-159  mg/dL   Borderline  253-664  mg/dL   High  >403     mg/dL   Very High Performed at [facility], [address], [place], [place] [address]   Heparin level (unfractionated)     Status: Abnormal   Collection Time: 08/19/20  1:08 PM  Result Value Ref Range   Heparin Unfractionated 0.19 (L)  0.30 - 0.70 IU/mL    Comment: (NOTE) If heparin results are below expected values, and patient dosage has  been confirmed, suggest follow up testing of antithrombin III levels. Performed at [facility], [address], [place], [place] [address]   Heparin level (unfractionated)     Status: None   Collection Time: 08/20/20  1:32 AM  Result Value Ref Range   Heparin Unfractionated 0.49 0.30 - 0.70 IU/mL    Comment: (NOTE) If heparin results are below expected values, and patient dosage has  been confirmed, suggest follow up testing of antithrombin III levels. Performed at [facility], [address] [place], [place] [address]   CBC     Status: None   Collection Time: 08/20/20  1:32 AM  Result Value Ref Range   WBC 8.6 4.0 - 10.5 K/uL   RBC 4.51 4.22 - 5.81 MIL/uL   Hemoglobin 14.6 13.0 - 17.0 g/dL   HCT 37.1 06.2 - 69.4 %   MCV 96.7 80.0 - 100.0 fL   MCH 32.4 26.0 - 34.0 pg   MCHC 33.5 30.0 - 36.0 g/dL   RDW 85.4 62.7 - 03.5 %   Platelets 228 150 - 400 K/uL   nRBC 0.0 0.0 - 0.2 %    Comment: Performed at [facility], [address], [place], [place] [address]  Basic metabolic panel     Status: Abnormal   Collection Time: 08/20/20  1:32 AM  Result Value Ref Range   Sodium 138 135 - 145 mmol/L   Potassium 4.1 3.5 - 5.1 mmol/L   Chloride 100 98 - 111 mmol/L   CO2 31 22 - 32 mmol/L   Glucose, Bld 150 (H) 70 - 99 mg/dL     Comment: Glucose reference range applies only to samples taken after fasting for at least 8 hours.   BUN 28 (H) 8 - 23 mg/dL   Creatinine, Ser 1.82 0.61 - 1.24 mg/dL   Calcium 8.9 8.9 - 99.3 mg/dL   GFR, Estimated 59 (L) >60 mL/min    Comment: (NOTE) Calculated using the CKD-EPI Creatinine Equation (2021)    Anion gap 7 5 - 15    Comment: Performed at [facility], [address], [place], [place] [address]    DG CHEST PORT 1 VIEW  Result Date: 08/19/2020 CLINICAL DATA:  Shortness of breath. EXAM: PORTABLE CHEST 1 VIEW COMPARISON:  None. FINDINGS: Normal cardiac silhouette and mediastinal contours. Small to moderate-sized layering bilateral pleural effusion with associated bibasilar heterogeneous/consolidative opacities. Pulmonary vasculature appears indistinct with cephalization flow. Skin fold overlies the lateral aspect the right mid lung. No definite pneumothorax. No acute osseous abnormalities. Stigmata of dish within the thoracic spine. IMPRESSION: Findings suggestive of pulmonary edema with small to moderate-sized layering bilateral effusions and associated bibasilar opacities, atelectasis versus infiltrate. Electronically Signed   By: Simonne Come M.D.   On: 08/19/2020 08:03   MR CARDIAC MORPHOLOGY W WO CONTRAST  Result Date: 08/19/2020 CLINICAL DATA:  Severe multivessel CAD, evaluate viability EXAM: CARDIAC MRI TECHNIQUE: The patient was scanned on a 1.5 Tesla Siemens magnet. A dedicated cardiac coil was used. Functional imaging was done using Fiesta sequences. 2,3, and 4 chamber views were done to assess for RWMA's. Modified Simpson's rule using a short axis stack was used to calculate an ejection fraction on a dedicated work [employer]. The patient received 7 cc of Gadavist. After 10 minutes inversion recovery sequences were used to  assess for infiltration and scar tissue. CONTRAST:  7cc  of Gadavist FINDINGS: Left ventricle: -Moderate dilatation -Severe systolic  dysfunction. Akinesis of mid inferior/inferoseptal/anteroseptal/anterior walls, apical inferior/septal/anterior walls, and apex -LGE: >50% transmural LGE: Mid anteroseptum/inferoseptum, apical anterior/septal/inferior, and apex. <50% transmural LGE: Mid anterior/inferior -RV insertion site LGE LV EF: 28% (Normal 56-78%) Absolute volumes: LV EDV:233 mL (Normal 77-195 mL) LV ESV: (Normal 19-72 mL) LV SV: 60mL (Normal 51-133 mL) CO: 4.6L/min (Normal 2.8-8.8 L/min) Indexed volumes: LV EDV: 167mL/sq-m (Normal 47-92 mL/sq-m) LV ESV: 2mL/sq-m (Normal 13-30 mL/sq-m) LV SV: 7mL/sq-m (Normal 32-62 mL/sq-m) CI: 2.5L/min/sq-m (Normal 1.7-4.2 L/min/sq-m) Right ventricle: Normal size and systolic function RV EF:  65% (Normal 47-74%) Absolute volumes: RV EDV: (Normal 88-227 mL) RV ESV: 9mL (Normal 23-103 mL) RV SV: 44mL (Normal 52-138 mL) CO: 4.7L/min (Normal 2.8-8.8 L/min) Indexed volumes: RV EDV: 69mL/sq-m (Normal 55-105 mL/sq-m) RV ESV: 67mL/sq-m (Normal 15-43 mL/sq-m) RV SV: 76mL/sq-m (Normal 32-64 mL/sq-m) CI: 2.6L/min/sq-m (Normal 1.7-4.2 L/min/sq-m) Left atrium: Mild enlargement Right atrium: Normal size Mitral valve: Mild regurgitation Aortic valve: Tricuspid.  No regurgitation Tricuspid valve: Mild regurgitation Pulmonic valve: No regurgitation Aorta: Mild dilatation of ascending aorta measuring 34mm Pericardium: Normal Extracardiac structures: Moderate bilateral pleural effusions IMPRESSION: 1. Moderate LV dilatation with severe systolic dysfunction (EF 28%). Akinesis of mid inferior/inferoseptal/anteroseptal/anterior walls, apical inferior/septal/anterior walls, and apex 2. 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 LGE is <50% transmural suggesting viability in the mid anterior/inferior walls. LGE is >50% transmural suggesting nonviability in the mid anteroseptum/inferoseptum, apical anterior/septal/inferior walls, and apex. 3. RV insertion site LGE, which is a nonspecific  finding often seen in setting of elevated pulmonary pressures 4.  Moderate bilateral pleural effusions Electronically Signed   By: Epifanio Lesches MD   On: 08/19/2020 22:44   ECHOCARDIOGRAM COMPLETE  Result Date: 08/19/2020    ECHOCARDIOGRAM REPORT   Patient Name:   Paul [name] Date of Exam: 08/19/2020 Medical Rec #:  [id number]    Height:       71.0 in Accession #:    [id number]   Weight:       142.8 lb Date of Birth:  July 23, 1940     BSA:          1.827 m Patient Age:    79 years     BP:           110/69 mmHg Patient Gender: M            HR:           86 bpm. Exam Location:  Inpatient Procedure: 2D Echo, Color Doppler, Limited Color Doppler and Intracardiac            Opacification Agent Indications:    I50.9* Heart failure (unspecified)  History:        Patient has no prior history of Echocardiogram examinations.                 Signs/Symptoms:Chest Pain and Shortness of Breath.  Sonographer:    Roosvelt Maser RDCS Referring Phys: [id number] [facility] IMPRESSIONS  1. Left ventricular ejection fraction, by estimation, is 25 to 30%. The left ventricle has severely decreased function.  2. The left ventricle demonstrates regional wall motion abnormalities . All mid-to-apical septal, mid-to-apical anterolateral, mid-to-apical anterior, apical inferior and apex appear akinetic. The rest of the LV walls are hypokinetic.  3. There is mild concentric left ventricular hypertrophy. Left ventricular diastolic parameters are consistent with Grade II diastolic dysfunction (pseudonormalization).  4. Swirling of  contrast seen in the left ventricle consistent with low flow state. No LV thrombus visualized.  5. Right ventricular systolic function is normal. The right ventricular size is normal.  6. The mitral valve is abnormal. Trivial mitral valve regurgitation. No evidence of mitral stenosis. Moderate mitral annular calcification.  7. The aortic valve has an indeterminant number of cusps. There is moderate calcification of the  aortic valve. There is moderate thickening of the aortic valve. Aortic valve regurgitation is not visualized. Mild aortic valve stenosis. Gradients low due to low output state.  8. Wall motion concerning for multivessel coronary artery disease. FINDINGS  Left Ventricle: Left ventricular ejection fraction, by estimation, is 25 to 30%. The left ventricle has severely decreased function. The left ventricle demonstrates regional wall motion abnormalities. The mid-to-apical septal, mid-to-apical anterolateral, mid-to-apical anterior, apical inferior and apex appear akinetic. The rest of the LV walls are hypokinetic. Definity contrast agent was given IV to delineate the left ventricular endocardial borders. The left ventricular internal cavity size was normal in size. There is mild concentric left ventricular hypertrophy. Left ventricular diastolic parameters are consistent with Grade II diastolic dysfunction (pseudonormalization). Right Ventricle: The right ventricular size is normal. No increase in right ventricular wall thickness. Right ventricular systolic function is normal. Left Atrium: Left atrial size was normal in size. Right Atrium: Right atrial size was normal in size. Pericardium: There is no evidence of pericardial effusion. Mitral Valve: The mitral valve is abnormal. There is moderate thickening of the mitral valve leaflet(s). There is moderate calcification of the mitral valve leaflet(s). Moderate mitral annular calcification. Trivial mitral valve regurgitation. No evidence of mitral valve stenosis. Tricuspid Valve: The tricuspid valve is normal in structure. Tricuspid valve regurgitation is trivial. Aortic Valve: The aortic valve has an indeterminant number of cusps. There is moderate calcification of the aortic valve. There is moderate thickening of the aortic valve. Aortic valve regurgitation is not visualized. Mild aortic stenosis is present. Aortic valve mean gradient measures 2.0 mmHg. Aortic valve  peak gradient measures 3.2 mmHg. Aortic valve area, by VTI measures 3.09 cm. Pulmonic Valve: The pulmonic valve was not well visualized. Pulmonic valve regurgitation is not visualized. Aorta: The aortic root and ascending aorta are structurally normal, with no evidence of dilitation. IAS/Shunts: No atrial level shunt detected by color flow Doppler.  LEFT VENTRICLE PLAX 2D LVIDd:         4.40 cm      Diastology LVIDs:         3.30 cm      LV e' medial:    4.57 cm/s LV PW:         0.90 cm      LV E/e' medial:  20.9 LV IVS:        0.90 cm      LV e' lateral:   7.18 cm/s LVOT diam:     1.90 cm      LV E/e' lateral: 13.3 LV SV:         52 LV SV Index:   29 LVOT Area:     2.84 cm  LV Volumes (MOD) LV vol d, MOD A2C: 129.0 ml LV vol d, MOD A4C: 128.0 ml LV vol s, MOD A2C: 97.0 ml LV vol s, MOD A4C: 104.0 ml LV SV MOD A2C:     32.0 ml LV SV MOD A4C:     128.0 ml LV SV MOD BP:      30.2 ml RIGHT VENTRICLE  IVC RV Basal diam:  3.90 cm  IVC diam: 1.90 cm LEFT ATRIUM             Index       RIGHT ATRIUM           Index LA diam:        4.00 cm 2.19 cm/m  RA Area:     14.00 cm LA Vol (A2C):   59.9 ml 32.78 ml/m RA Volume:   38.90 ml  21.29 ml/m LA Vol (A4C):   61.8 ml 33.82 ml/m LA Biplane Vol: 61.8 ml 33.82 ml/m  AORTIC VALVE AV Area (Vmax):    3.02 cm AV Area (Vmean):   2.71 cm AV Area (VTI):     3.09 cm AV Vmax:           89.60 cm/s AV Vmean:          68.800 cm/s AV VTI:            0.169 m AV Peak Grad:      3.2 mmHg AV Mean Grad:      2.0 mmHg LVOT Vmax:         95.30 cm/s LVOT Vmean:        65.700 cm/s LVOT VTI:          0.184 m LVOT/AV VTI ratio: 1.09  AORTA Ao Root diam: 3.40 cm Ao Asc diam:  3.20 cm MITRAL VALVE MV Area (PHT): 4.49 cm    SHUNTS MV Decel Time: 169 msec    Systemic VTI:  0.18 m MV E velocity: 95.50 cm/s  Systemic Diam: 1.90 cm MV A velocity: 75.40 cm/s MV E/A ratio:  1.27 Laurance Flatten MD Electronically signed by Laurance Flatten MD Signature Date/Time: 08/19/2020/3:07:53 PM    Final      Review of Systems  Constitutional: Positive for activity change, fatigue and unexpected weight change.  HENT: Negative.   Eyes: Negative.   Respiratory: Positive for chest tightness and shortness of breath.   Cardiovascular: Positive for chest pain and leg swelling.  Gastrointestinal: Negative.   Endocrine: Negative.   Genitourinary: Negative.   Musculoskeletal: Negative.   Skin: Negative.   Allergic/Immunologic: Negative.   Neurological: Negative for dizziness and syncope.  Hematological: Negative.   Psychiatric/Behavioral: Negative.    Blood pressure 100/71, pulse 76, temperature 98.2 F (36.8 C), temperature source Oral, resp. rate 20, height  (1.803 m), weight 64.5 kg, SpO2 95 %. Physical Exam Constitutional:      Appearance: Normal appearance. He is normal weight.  HENT:     Head: Normocephalic and atraumatic.  Eyes:     Extraocular Movements: Extraocular movements intact.     Conjunctiva/sclera: Conjunctivae normal.     Pupils: Pupils are equal, round, and reactive to light.  Neck:     Vascular: No carotid bruit.  Cardiovascular:     Rate and Rhythm: Normal rate and regular rhythm.     Pulses: Normal pulses.     Heart sounds: Normal heart sounds. No murmur heard.   Pulmonary:     Effort: Pulmonary effort is normal.     Breath sounds: Normal breath sounds.  Abdominal:     General: Abdomen is flat. Bowel sounds are normal. There is no distension.     Palpations: Abdomen is soft.     Tenderness: There is no abdominal tenderness.  Musculoskeletal:        General: No swelling. Normal range of motion.     Cervical back: Normal range  of motion and neck supple.  Skin:    General: Skin is warm and dry.  Neurological:     General: No focal deficit present.     Mental Status: He is alert and oriented to person, place, and time.  Psychiatric:        Mood and Affect: Mood normal.        Behavior: Behavior normal.    Cath from [facility] reviewed in our  system.   ECHOCARDIOGRAM REPORT       Patient Name:  Paul [name] Date of Exam: 08/19/2020  Medical Rec #: [id number]  Height:    71.0 in  Accession #:  [id number]  Weight:    142.8 lb  Date of Birth: September 29, 1940   BSA:     1.827 m  Patient Age:  79 years   BP:      110/69 mmHg  Patient Gender: M      HR:      86 bpm.  Exam Location: Inpatient   Procedure: 2D Echo, Color Doppler, Limited Color Doppler and Intracardiac       Opacification Agent   Indications:  I50.9* Heart failure (unspecified)    History:    Patient has no prior history of Echocardiogram  examinations.         Signs/Symptoms:Chest Pain and Shortness of Breath.    Sonographer:  Roosvelt Maser RDCS  Referring Phys: [id number] [facility]   IMPRESSIONS    1. Left ventricular ejection fraction, by estimation, is 25 to 30%. The  left ventricle has severely decreased function.  2. The left ventricle demonstrates regional wall motion abnormalities .  All mid-to-apical septal, mid-to-apical anterolateral, mid-to-apical  anterior, apical inferior and apex appear akinetic. The rest of the LV  walls are hypokinetic.  3. There is mild concentric left ventricular hypertrophy. Left  ventricular diastolic parameters are consistent with Grade II diastolic  dysfunction (pseudonormalization).  4. Swirling of contrast seen in the left ventricle consistent with low  flow state. No LV thrombus visualized.  5. Right ventricular systolic function is normal. The right ventricular  size is normal.  6. The mitral valve is abnormal. Trivial mitral valve regurgitation. No  evidence of mitral stenosis. Moderate mitral annular calcification.  7. The aortic valve has an indeterminant number of cusps. There is  moderate calcification of the aortic valve. There is moderate thickening  of the aortic valve. Aortic valve regurgitation is not visualized. Mild  aortic valve  stenosis. Gradients low due  to low output state.  8. Wall motion concerning for multivessel coronary artery disease.   FINDINGS  Left Ventricle: Left ventricular ejection fraction, by estimation, is 25  to 30%. The left ventricle has severely decreased function. The left  ventricle demonstrates regional wall motion abnormalities. The  mid-to-apical septal, mid-to-apical  anterolateral, mid-to-apical anterior, apical inferior and apex appear  akinetic. The rest of the LV walls are hypokinetic. Definity contrast  agent was given IV to delineate the left ventricular endocardial borders.  The left ventricular internal cavity  size was normal in size. There is mild concentric left ventricular  hypertrophy. Left ventricular diastolic parameters are consistent with  Grade II diastolic dysfunction (pseudonormalization).   Right Ventricle: The right ventricular size is normal. No increase in  right ventricular wall thickness. Right ventricular systolic function is  normal.   Left Atrium: Left atrial size was normal in size.   Right Atrium: Right atrial size was normal in size.   Pericardium: There is no  evidence of pericardial effusion.   Mitral Valve: The mitral valve is abnormal. There is moderate thickening  of the mitral valve leaflet(s). There is moderate calcification of the  mitral valve leaflet(s). Moderate mitral annular calcification. Trivial  mitral valve regurgitation. No  evidence of mitral valve stenosis.   Tricuspid Valve: The tricuspid valve is normal in structure. Tricuspid  valve regurgitation is trivial.   Aortic Valve: The aortic valve has an indeterminant number of cusps. There  is moderate calcification of the aortic valve. There is moderate  thickening of the aortic valve. Aortic valve regurgitation is not  visualized. Mild aortic stenosis is present.  Aortic valve mean gradient measures 2.0 mmHg. Aortic valve peak gradient  measures 3.2 mmHg. Aortic valve  area, by VTI measures 3.09 cm.   Pulmonic Valve: The pulmonic valve was not well visualized. Pulmonic valve  regurgitation is not visualized.   Aorta: The aortic root and ascending aorta are structurally normal, with  no evidence of dilitation.   IAS/Shunts: No atrial level shunt detected by color flow Doppler.     LEFT VENTRICLE  PLAX 2D  LVIDd:     4.40 cm   Diastology  LVIDs:     3.30 cm   LV e' medial:  4.57 cm/s  LV PW:     0.90 cm   LV E/e' medial: 20.9  LV IVS:    0.90 cm   LV e' lateral:  7.18 cm/s  LVOT diam:   1.90 cm   LV E/e' lateral: 13.3  LV SV:     52  LV SV Index:  29  LVOT Area:   2.84 cm    LV Volumes (MOD)  LV vol d, MOD A2C: 129.0 ml  LV vol d, MOD A4C: 128.0 ml  LV vol s, MOD A2C: 97.0 ml  LV vol s, MOD A4C: 104.0 ml  LV SV MOD A2C:   32.0 ml  LV SV MOD A4C:   128.0 ml  LV SV MOD BP:   30.2 ml   RIGHT VENTRICLE     IVC  RV Basal diam: 3.90 cm IVC diam: 1.90 cm   LEFT ATRIUM       Index    RIGHT ATRIUM      Index  LA diam:    4.00 cm 2.19 cm/m RA Area:   14.00 cm  LA Vol (A2C):  59.9 ml 32.78 ml/m RA Volume:  38.90 ml 21.29 ml/m  LA Vol (A4C):  61.8 ml 33.82 ml/m  LA Biplane Vol: 61.8 ml 33.82 ml/m  AORTIC VALVE  AV Area (Vmax):  3.02 cm  AV Area (Vmean):  2.71 cm  AV Area (VTI):   3.09 cm  AV Vmax:      89.60 cm/s  AV Vmean:     68.800 cm/s  AV VTI:      0.169 m  AV Peak Grad:   3.2 mmHg  AV Mean Grad:   2.0 mmHg  LVOT Vmax:     95.30 cm/s  LVOT Vmean:    65.700 cm/s  LVOT VTI:     0.184 m  LVOT/AV VTI ratio: 1.09    AORTA  Ao Root diam: 3.40 cm  Ao Asc diam: 3.20 cm   MITRAL VALVE  MV Area (PHT): 4.49 cm  SHUNTS  MV Decel Time: 169 msec  Systemic VTI: 0.18 m  MV E velocity: 95.50 cm/s Systemic Diam: 1.90 cm  MV A velocity: 75.40 cm/s  MV E/A ratio: 1.27  Laurance Flatten MD   Electronically signed by Laurance Flatten MD  Signature Date/Time: 08/19/2020/3:07:53 PM     Narrative & Impression  CLINICAL DATA:  Severe multivessel CAD, evaluate viability  EXAM: CARDIAC MRI  TECHNIQUE: The patient was scanned on a 1.5 Tesla Siemens magnet. A dedicated cardiac coil was used. Functional imaging was done using Fiesta sequences. 2,3, and 4 chamber views were done to assess for RWMA's. Modified Simpson's rule using a short axis stack was used to calculate an ejection fraction on a dedicated work [employer]. The patient received 7 cc of Gadavist. After 10 minutes inversion recovery sequences were used to assess for infiltration and scar tissue.  CONTRAST:  7cc  of Gadavist  FINDINGS: Left ventricle:  -Moderate dilatation  -Severe systolic dysfunction. Akinesis of mid inferior/inferoseptal/anteroseptal/anterior walls, apical inferior/septal/anterior walls, and apex  -LGE: >50% transmural LGE: Mid anteroseptum/inferoseptum, apical anterior/septal/inferior, and apex. <50% transmural LGE: Mid anterior/inferior  -RV insertion site LGE  LV EF: 28% (Normal 56-78%)  Absolute volumes:  LV EDV:233 mL (Normal 77-195 mL)  LV ESV: (Normal 19-72 mL)  LV SV: 50mL (Normal 51-133 mL)  CO: 4.6L/min (Normal 2.8-8.8 L/min)  Indexed volumes:  LV EDV: 148mL/sq-m (Normal 47-92 mL/sq-m)  LV ESV: 60mL/sq-m (Normal 13-30 mL/sq-m)  LV SV: 73mL/sq-m (Normal 32-62 mL/sq-m)  CI: 2.5L/min/sq-m (Normal 1.7-4.2 L/min/sq-m)  Right ventricle: Normal size and systolic function  RV EF:  65% (Normal 47-74%)  Absolute volumes:  RV EDV: (Normal 88-227 mL)  RV ESV: 52mL (Normal 23-103 mL)  RV SV: 32mL (Normal 52-138 mL)  CO: 4.7L/min (Normal 2.8-8.8 L/min)  Indexed volumes:  RV EDV: 7mL/sq-m (Normal 55-105 mL/sq-m)  RV ESV: 2mL/sq-m (Normal 15-43 mL/sq-m)  RV SV: 56mL/sq-m (Normal 32-64  mL/sq-m)  CI: 2.6L/min/sq-m (Normal 1.7-4.2 L/min/sq-m)  Left atrium: Mild enlargement  Right atrium: Normal size  Mitral valve: Mild regurgitation  Aortic valve: Tricuspid.  No regurgitation  Tricuspid valve: Mild regurgitation  Pulmonic valve: No regurgitation  Aorta: Mild dilatation of ascending aorta measuring 78mm  Pericardium: Normal  Extracardiac structures: Moderate bilateral pleural effusions  IMPRESSION: 1. Moderate LV dilatation with severe systolic dysfunction (EF 28%). Akinesis of mid inferior/inferoseptal/anteroseptal/anterior walls, apical inferior/septal/anterior walls, and apex  2. 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 LGE is <50% transmural suggesting viability in the mid anterior/inferior walls. LGE is >50% transmural suggesting nonviability in the mid anteroseptum/inferoseptum, apical anterior/septal/inferior walls, and apex.  3. RV insertion site LGE, which is a nonspecific finding often seen in setting of elevated pulmonary pressures  4.  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  He has improved with diuresis.  His progressive worsening congestive heart failure was preceded by recurrent chest discomfort suggesting that he probably had an acute MI in December when this started.  He has fairly good graftable vessels.  Cardiac MRI shows an ejection fraction of 28% with viability in the mid anterior and inferior walls but significant areas of nonviability in the mid anterior septum/inferior septum, apical  anterior/septal/inferior walls and apex.  His entire lateral wall is at high risk given the left circumflex stenoses and hopefully his anterior and  inferior walls will improve with revascularization.  I discussed the operative procedure with the patient and his daughter including alternatives, benefits and risks; including but not limited to bleeding, blood transfusion, infection, stroke, myocardial infarction, graft failure, heart block requiring a permanent pacemaker, organ dysfunction, and death.  Paul [name] understands and agrees to proceed.  We will schedule surgery for Friday am. In the mean time he should be up walking to maintain mobility.   Alleen Borne 08/20/2020, 6:55 PM

## 2020-08-20 NOTE — Progress Notes (Signed)
Progress Note  Patient Name: Paul GaudierWayne Piehl Date of Encounter: 08/20/2020  Primary Cardiologist: new  Subjective   No recurrent chest pain  Inpatient Medications    Scheduled Meds:  aspirin EC  81 mg Oral Daily   carvedilol  6.25 mg Oral BID WC   feeding supplement  237 mL Oral BID BM   finasteride  5 mg Oral Daily   furosemide  40 mg Intravenous Daily   multivitamin with minerals  1 tablet Oral Daily   rosuvastatin  40 mg Oral Daily   Continuous Infusions:  heparin 1,400 Units/hr (08/20/20 0345)   PRN Meds: acetaminophen, nitroGLYCERIN, ondansetron (ZOFRAN) IV   Vital Signs    Vitals:   08/20/20 0446 08/20/20 0600 08/20/20 0700 08/20/20 0800  BP:  115/75 101/72 116/73  Pulse:      Resp:   17 17  Temp:      TempSrc:      SpO2:  92% 94% 94%  Weight: 64.5 kg     Height:        Intake/Output Summary (Last 24 hours) at 08/20/2020 0811 Last data filed at 08/20/2020 0500 Gross per 24 hour  Intake 304 ml  Output 2750 ml  Net -2446 ml    I/O since admission: -3296  Filed Weights   08/19/20 0302 08/20/20 0446  Weight: 64.8 kg 64.5 kg    Telemetry    Sinus in the 60s - Personally Reviewed  ECG    ECG (independently read by me): will repeat  Physical Exam   BP 116/73    Pulse 73    Temp 98.2 F (36.8 C) (Oral)    Resp 17    Ht 5\' 11"  (1.803 m)    Wt 64.5 kg    SpO2 94%    BMI 19.85 kg/m  General: Alert, oriented, no distress.  Skin: normal turgor, no rashes, warm and dry HEENT: Normocephalic, atraumatic. Pupils equal round and reactive to light; sclera anicteric; extraocular muscles intact;  Nose without nasal septal hypertrophy Mouth/Parynx benign; Mallinpatti scale Neck: No JVD, no carotid bruits; normal carotid upstroke Lungs: clear to ausculatation and percussion; no wheezing or rales Chest wall: without tenderness to palpitation Heart: PMI not displaced, RRR, s1 s2 normal, 1/6 systolic murmur, no diastolic murmur, no rubs, gallops, thrills,  or heaves Abdomen: soft, nontender; no hepatosplenomehaly, BS+; abdominal aorta nontender and not dilated by palpation. Back: no CVA tenderness Pulses 2+ Musculoskeletal: full range of motion, normal strength, no joint deformities Extremities: no clubbing cyanosis or edema, Homan's sign negative  Neurologic: grossly nonfocal; Cranial nerves grossly wnl Psychologic: Normal mood and affect   Labs    Chemistry Recent Labs  Lab 08/19/20 0358 08/20/20 0132  NA 136 138  K 3.6 4.1  CL 99 100  CO2 27 31  GLUCOSE 117* 150*  BUN 31* 28*  CREATININE 1.21 1.24  CALCIUM 8.9 8.9  PROT 6.4*  --   ALBUMIN 3.0*  --   AST 17  --   ALT 20  --   ALKPHOS 53  --   BILITOT 0.8  --   GFRNONAA >60 59*  ANIONGAP 10 7     Hematology Recent Labs  Lab 08/19/20 0358 08/20/20 0132  WBC 12.1* 8.6  RBC 4.60 4.51  HGB 14.6 14.6  HCT 44.9 43.6  MCV 97.6 96.7  MCH 31.7 32.4  MCHC 32.5 33.5  RDW 13.6 13.7  PLT 250 228    Cardiac EnzymesNo results for input(s): TROPONINI in the  last 168 hours. No results for input(s): TROPIPOC in the last 168 hours.   BNP Recent Labs  Lab 08/19/20 0358  BNP 1,109.1*     DDimer No results for input(s): DDIMER in the last 168 hours.   Lipid Panel     Component Value Date/Time   CHOL 105 08/19/2020 0358   TRIG 82 08/19/2020 0358   HDL 41 08/19/2020 0358   CHOLHDL 2.6 08/19/2020 0358   VLDL 16 08/19/2020 0358   LDLCALC 48 08/19/2020 0358     Radiology    DG CHEST PORT 1 VIEW  Result Date: 08/19/2020 CLINICAL DATA:  Shortness of breath. EXAM: PORTABLE CHEST 1 VIEW COMPARISON:  None. FINDINGS: Normal cardiac silhouette and mediastinal contours. Small to moderate-sized layering bilateral pleural effusion with associated bibasilar heterogeneous/consolidative opacities. Pulmonary vasculature appears indistinct with cephalization flow. Skin fold overlies the lateral aspect the right mid lung. No definite pneumothorax. No acute osseous abnormalities.  Stigmata of dish within the thoracic spine. IMPRESSION: Findings suggestive of pulmonary edema with small to moderate-sized layering bilateral effusions and associated bibasilar opacities, atelectasis versus infiltrate. Electronically Signed   By: Simonne Come M.D.   On: 08/19/2020 08:03   MR CARDIAC MORPHOLOGY W WO CONTRAST  Result Date: 08/19/2020 CLINICAL DATA:  Severe multivessel CAD, evaluate viability EXAM: CARDIAC MRI TECHNIQUE: The patient was scanned on a 1.5 Tesla Siemens magnet. A dedicated cardiac coil was used. Functional imaging was done using Fiesta sequences. 2,3, and 4 chamber views were done to assess for RWMA's. Modified Simpson's rule using a short axis stack was used to calculate an ejection fraction on a dedicated work [employer]. The patient received 7 cc of Gadavist. After 10 minutes inversion recovery sequences were used to assess for infiltration and scar tissue. CONTRAST:  7cc  of Gadavist FINDINGS: Left ventricle: -Moderate dilatation -Severe systolic dysfunction. Akinesis of mid inferior/inferoseptal/anteroseptal/anterior walls, apical inferior/septal/anterior walls, and apex -LGE: >50% transmural LGE: Mid anteroseptum/inferoseptum, apical anterior/septal/inferior, and apex. <50% transmural LGE: Mid anterior/inferior -RV insertion site LGE LV EF: 28% (Normal 56-78%) Absolute volumes: LV EDV:233 mL (Normal 77-195 mL) LV ESV: (Normal 19-72 mL) LV SV: 67mL (Normal 51-133 mL) CO: 4.6L/min (Normal 2.8-8.8 L/min) Indexed volumes: LV EDV: 169mL/sq-m (Normal 47-92 mL/sq-m) LV ESV: 30mL/sq-m (Normal 13-30 mL/sq-m) LV SV: 13mL/sq-m (Normal 32-62 mL/sq-m) CI: 2.5L/min/sq-m (Normal 1.7-4.2 L/min/sq-m) Right ventricle: Normal size and systolic function RV EF:  65% (Normal 47-74%) Absolute volumes: RV EDV: (Normal 88-227 mL) RV ESV: 6mL (Normal 23-103 mL) RV SV: 56mL (Normal 52-138 mL) CO: 4.7L/min (Normal 2.8-8.8 L/min) Indexed volumes: RV EDV: 29mL/sq-m (Normal  55-105 mL/sq-m) RV ESV: 56mL/sq-m (Normal 15-43 mL/sq-m) RV SV: 54mL/sq-m (Normal 32-64 mL/sq-m) CI: 2.6L/min/sq-m (Normal 1.7-4.2 L/min/sq-m) Left atrium: Mild enlargement Right atrium: Normal size Mitral valve: Mild regurgitation Aortic valve: Tricuspid.  No regurgitation Tricuspid valve: Mild regurgitation Pulmonic valve: No regurgitation Aorta: Mild dilatation of ascending aorta measuring 69mm Pericardium: Normal Extracardiac structures: Moderate bilateral pleural effusions IMPRESSION: 1. Moderate LV dilatation with severe systolic dysfunction (EF 28%). Akinesis of mid inferior/inferoseptal/anteroseptal/anterior walls, apical inferior/septal/anterior walls, and apex 2. 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 LGE is <50% transmural suggesting viability in the mid anterior/inferior walls. LGE is >50% transmural suggesting nonviability in the mid anteroseptum/inferoseptum, apical anterior/septal/inferior walls, and apex. 3. RV insertion site LGE, which is a nonspecific finding often seen in setting of elevated pulmonary pressures 4.  Moderate bilateral pleural effusions Electronically Signed   By: Epifanio Lesches MD  On: 08/19/2020 22:44   ECHOCARDIOGRAM COMPLETE  Result Date: 08/19/2020    ECHOCARDIOGRAM REPORT   Patient Name:   Paul [name] Date of Exam: 08/19/2020 Medical Rec #:  [id number]    Height:       71.0 in Accession #:    [id number]   Weight:       142.8 lb Date of Birth:  11/09/1940     BSA:          1.827 m Patient Age:    79 years     BP:           110/69 mmHg Patient Gender: M            HR:           86 bpm. Exam Location:  Inpatient Procedure: 2D Echo, Color Doppler, Limited Color Doppler and Intracardiac            Opacification Agent Indications:    I50.9* Heart failure (unspecified)  History:        Patient has no prior history of Echocardiogram examinations.                 Signs/Symptoms:Chest Pain and Shortness of Breath.  Sonographer:    Roosvelt Maser  RDCS Referring Phys: [id number] [facility] IMPRESSIONS  1. Left ventricular ejection fraction, by estimation, is 25 to 30%. The left ventricle has severely decreased function.  2. The left ventricle demonstrates regional wall motion abnormalities . All mid-to-apical septal, mid-to-apical anterolateral, mid-to-apical anterior, apical inferior and apex appear akinetic. The rest of the LV walls are hypokinetic.  3. There is mild concentric left ventricular hypertrophy. Left ventricular diastolic parameters are consistent with Grade II diastolic dysfunction (pseudonormalization).  4. Swirling of contrast seen in the left ventricle consistent with low flow state. No LV thrombus visualized.  5. Right ventricular systolic function is normal. The right ventricular size is normal.  6. The mitral valve is abnormal. Trivial mitral valve regurgitation. No evidence of mitral stenosis. Moderate mitral annular calcification.  7. The aortic valve has an indeterminant number of cusps. There is moderate calcification of the aortic valve. There is moderate thickening of the aortic valve. Aortic valve regurgitation is not visualized. Mild aortic valve stenosis. Gradients low due to low output state.  8. Wall motion concerning for multivessel coronary artery disease. FINDINGS  Left Ventricle: Left ventricular ejection fraction, by estimation, is 25 to 30%. The left ventricle has severely decreased function. The left ventricle demonstrates regional wall motion abnormalities. The mid-to-apical septal, mid-to-apical anterolateral, mid-to-apical anterior, apical inferior and apex appear akinetic. The rest of the LV walls are hypokinetic. Definity contrast agent was given IV to delineate the left ventricular endocardial borders. The left ventricular internal cavity size was normal in size. There is mild concentric left ventricular hypertrophy. Left ventricular diastolic parameters are consistent with Grade II diastolic dysfunction  (pseudonormalization). Right Ventricle: The right ventricular size is normal. No increase in right ventricular wall thickness. Right ventricular systolic function is normal. Left Atrium: Left atrial size was normal in size. Right Atrium: Right atrial size was normal in size. Pericardium: There is no evidence of pericardial effusion. Mitral Valve: The mitral valve is abnormal. There is moderate thickening of the mitral valve leaflet(s). There is moderate calcification of the mitral valve leaflet(s). Moderate mitral annular calcification. Trivial mitral valve regurgitation. No evidence of mitral valve stenosis. Tricuspid Valve: The tricuspid valve is normal in structure. Tricuspid valve regurgitation is trivial. Aortic Valve: The aortic valve  has an indeterminant number of cusps. There is moderate calcification of the aortic valve. There is moderate thickening of the aortic valve. Aortic valve regurgitation is not visualized. Mild aortic stenosis is present. Aortic valve mean gradient measures 2.0 mmHg. Aortic valve peak gradient measures 3.2 mmHg. Aortic valve area, by VTI measures 3.09 cm. Pulmonic Valve: The pulmonic valve was not well visualized. Pulmonic valve regurgitation is not visualized. Aorta: The aortic root and ascending aorta are structurally normal, with no evidence of dilitation. IAS/Shunts: No atrial level shunt detected by color flow Doppler.  LEFT VENTRICLE PLAX 2D LVIDd:         4.40 cm      Diastology LVIDs:         3.30 cm      LV e' medial:    4.57 cm/s LV PW:         0.90 cm      LV E/e' medial:  20.9 LV IVS:        0.90 cm      LV e' lateral:   7.18 cm/s LVOT diam:     1.90 cm      LV E/e' lateral: 13.3 LV SV:         52 LV SV Index:   29 LVOT Area:     2.84 cm  LV Volumes (MOD) LV vol d, MOD A2C: 129.0 ml LV vol d, MOD A4C: 128.0 ml LV vol s, MOD A2C: 97.0 ml LV vol s, MOD A4C: 104.0 ml LV SV MOD A2C:     32.0 ml LV SV MOD A4C:     128.0 ml LV SV MOD BP:      30.2 ml RIGHT VENTRICLE           IVC RV Basal diam:  3.90 cm  IVC diam: 1.90 cm LEFT ATRIUM             Index       RIGHT ATRIUM           Index LA diam:        4.00 cm 2.19 cm/m  RA Area:     14.00 cm LA Vol (A2C):   59.9 ml 32.78 ml/m RA Volume:   38.90 ml  21.29 ml/m LA Vol (A4C):   61.8 ml 33.82 ml/m LA Biplane Vol: 61.8 ml 33.82 ml/m  AORTIC VALVE AV Area (Vmax):    3.02 cm AV Area (Vmean):   2.71 cm AV Area (VTI):     3.09 cm AV Vmax:           89.60 cm/s AV Vmean:          68.800 cm/s AV VTI:            0.169 m AV Peak Grad:      3.2 mmHg AV Mean Grad:      2.0 mmHg LVOT Vmax:         95.30 cm/s LVOT Vmean:        65.700 cm/s LVOT VTI:          0.184 m LVOT/AV VTI ratio: 1.09  AORTA Ao Root diam: 3.40 cm Ao Asc diam:  3.20 cm MITRAL VALVE MV Area (PHT): 4.49 cm    SHUNTS MV Decel Time: 169 msec    Systemic VTI:  0.18 m MV E velocity: 95.50 cm/s  Systemic Diam: 1.90 cm MV A velocity: 75.40 cm/s MV E/A ratio:  1.27 Laurance Flatten MD Electronically signed by Laurance Flatten MD  Signature Date/Time: 08/19/2020/3:07:53 PM    Final     Cardiac Studies   CARDIAC MRI IMPRESSION: 1. Moderate LV dilatation with severe systolic dysfunction (EF 28%). Akinesis of mid inferior/inferoseptal/anteroseptal/anterior walls, apical inferior/septal/anterior walls, and apex 2. 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 LGE is <50% transmural suggesting viability in the mid anterior/inferior walls. LGE is >50% transmural suggesting nonviability in the mid anteroseptum/inferoseptum, apical anterior/septal/inferior walls, and apex. 3. RV insertion site LGE, which is a nonspecific finding often seen in setting of elevated pulmonary pressures 4.  Moderate bilateral pleural effusions  ECHO IMPRESSIONS  1. Left ventricular ejection fraction, by estimation, is 25 to 30%. The left ventricle has severely decreased function.  2. The left ventricle demonstrates regional wall motion abnormalities . All mid-to-apical septal,  mid-to-apical anterolateral, mid-to-apical anterior, apical inferior and apex appear akinetic. The rest of the LV walls are hypokinetic.  3. There is mild concentric left ventricular hypertrophy. Left ventricular diastolic parameters are consistent with Grade II diastolic dysfunction (pseudonormalization).  4. Swirling of contrast seen in the left ventricle consistent with low flow state. No LV thrombus visualized.  5. Right ventricular systolic function is normal. The right ventricular size is normal.  6. The mitral valve is abnormal. Trivial mitral valve regurgitation. No evidence of mitral stenosis. Moderate mitral annular calcification.  7. The aortic valve has an indeterminant number of cusps. There is moderate calcification of the aortic valve. There is moderate thickening of the aortic valve. Aortic valve regurgitation is not visualized. Mild aortic valve stenosis. Gradients low due to low output state.  8. Wall motion concerning for multivessel coronary artery disease.  Patient Profile     Mr. Kuhnert is a 80 yo CM w/ PMH significant for BPH and HTN admitted to [facility] on 08/14/20- 08/18/2020 for hypoxia, ADHF, new HFrEF and MVD. He is subsequently transferred to [facility] for evaluation for CABG.   Assessment & Plan    1.  Severe calcified multivessel CAD: Outside films have been linked there is severe multivessel disease percent distal left main stenosis, 75 to 80% proximal LAD stenosis with subtotal mid LAD occlusion with reduced TIMI flow, ostial diagonal stenosis, subtotal circumflex as well as severe RCA disease.  Surgical consultation was called in today.  No recurrent chest pain on heparin drip. Will check ECG today.  2.  Ischemic cardiomyopathy/HFrEF: I/O -3296 since admission.  Cardiac MRI performed yesterday  shows moderate LV dilation with EF at 28%.  There is akinesis of the mid inferior/inferoseptal/anteroseptal and anterior walls with apical inferior/septal anterior walls  and apex.  There is some viability noted in the mid anterior/inferior walls.  There is nonviability in the mid anteroseptal, inferoseptal, apical anterior/septal inferior walls and apex.  Blood pressure today is stable at 116/75.  Will initiate losartan 12.5 mg daily and eventually transition to [facility], continue carvedilol 6.25 mg twice a day, add low-dose isosorbide 15 mg.  Will change furosemide 40 mg IV twice daily to 20 mg daily and if blood pressure remains stable tomorrow consider initiating low-dose spironolactone.  3.  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    Signed, Lennette Bihari, MD, [facility] 08/20/2020, 8:11 AM

## 2020-08-21 ENCOUNTER — Inpatient Hospital Stay (HOSPITAL_COMMUNITY): Payer: Medicare Other

## 2020-08-21 DIAGNOSIS — I25110 Atherosclerotic heart disease of native coronary artery with unstable angina pectoris: Secondary | ICD-10-CM | POA: Diagnosis not present

## 2020-08-21 DIAGNOSIS — Z01810 Encounter for preprocedural cardiovascular examination: Secondary | ICD-10-CM | POA: Diagnosis not present

## 2020-08-21 DIAGNOSIS — I5020 Unspecified systolic (congestive) heart failure: Secondary | ICD-10-CM | POA: Diagnosis not present

## 2020-08-21 DIAGNOSIS — I255 Ischemic cardiomyopathy: Secondary | ICD-10-CM | POA: Diagnosis not present

## 2020-08-21 DIAGNOSIS — E785 Hyperlipidemia, unspecified: Secondary | ICD-10-CM | POA: Diagnosis not present

## 2020-08-21 LAB — CBC
HCT: 41.1 % (ref 39.0–52.0)
Hemoglobin: 13.9 g/dL (ref 13.0–17.0)
MCH: 32.7 pg (ref 26.0–34.0)
MCHC: 33.8 g/dL (ref 30.0–36.0)
MCV: 96.7 fL (ref 80.0–100.0)
Platelets: 250 10*3/uL (ref 150–400)
RBC: 4.25 MIL/uL (ref 4.22–5.81)
RDW: 13.9 % (ref 11.5–15.5)
WBC: 9 10*3/uL (ref 4.0–10.5)
nRBC: 0 % (ref 0.0–0.2)

## 2020-08-21 LAB — BASIC METABOLIC PANEL
Anion gap: 8 (ref 5–15)
BUN: 32 mg/dL — ABNORMAL HIGH (ref 8–23)
CO2: 27 mmol/L (ref 22–32)
Calcium: 9 mg/dL (ref 8.9–10.3)
Chloride: 102 mmol/L (ref 98–111)
Creatinine, Ser: 1.02 mg/dL (ref 0.61–1.24)
GFR, Estimated: >60 mL/min (ref >60)
Glucose, Bld: 104 mg/dL — ABNORMAL HIGH (ref 70–99)
Potassium: 3.8 mmol/L (ref 3.5–5.1)
Sodium: 137 mmol/L (ref 135–145)

## 2020-08-21 LAB — HEPARIN LEVEL (UNFRACTIONATED): Heparin Unfractionated: 0.5 IU/mL (ref 0.30–0.70)

## 2020-08-21 NOTE — Progress Notes (Signed)
All 4 bedrails up per pts request. Pt A&Ox4.

## 2020-08-21 NOTE — Plan of Care (Signed)

## 2020-08-21 NOTE — Progress Notes (Addendum)
Progress Note  Patient Name: Paul [name] Date of Encounter: 08/21/2020  Primary Cardiologist: new  Subjective   No recurrent chest pain  Inpatient Medications    Scheduled Meds: . aspirin EC  81 mg Oral Daily  . carvedilol  6.25 mg Oral BID WC  . feeding supplement  237 mL Oral BID BM  . finasteride  5 mg Oral Daily  . furosemide  20 mg Oral Daily  . isosorbide mononitrate  15 mg Oral Daily  . losartan  12.5 mg Oral Daily  . multivitamin with minerals  1 tablet Oral Daily  . rosuvastatin  40 mg Oral Daily   Continuous Infusions: . heparin 1,400 Units/hr (08/20/20 2222)   PRN Meds: acetaminophen, nitroGLYCERIN, ondansetron (ZOFRAN) IV   Vital Signs    Vitals:   08/20/20 2000 08/20/20 2100 08/20/20 2300 08/21/20 0500  BP:  106/68 94/64 106/60  Pulse: 80  77 68  Resp: 20 (!) 22 19 18   Temp: 98.2 F (36.8 C)  98 F (36.7 C) 98.2 F (36.8 C)  TempSrc: Oral  Oral Oral  SpO2:  94% 97% 95%  Weight:    65 kg  Height:        Intake/Output Summary (Last 24 hours) at 08/21/2020 1110 Last data filed at 08/21/2020 10/21/2020 Gross per 24 hour  Intake 329 ml  Output 1000 ml  Net -671 ml    I/O since admission: -3967  Filed Weights   08/19/20 0302 08/20/20 0446 08/21/20 0500  Weight: 64.8 kg 64.5 kg 65 kg    Telemetry    Sinus in the 60s - Personally Reviewed  ECG    08/21/2020 ECG (independently read by me): Normal sinus rhythm at 65 bpm.  QS complex V1 through V3.  No ectopy.  Normal intervals.  Physical Exam   BP 106/60 (BP Location: Right Arm)   Pulse 68   Temp 98.2 F (36.8 C) (Oral)   Resp 18   Ht 5\' 11"  (1.803 m)   Wt 65 kg   SpO2 95%   BMI 19.99 kg/m  General: Alert, oriented, no distress.  Skin: normal turgor, no rashes, warm and dry HEENT: Normocephalic, atraumatic. Pupils equal round and reactive to light; sclera anicteric; extraocular muscles intact; Nose without nasal septal hypertrophy Mouth/Parynx benign; Mallinpatti scale Neck: No JVD, no  carotid bruits; normal carotid upstroke Lungs: clear to ausculatation and percussion; no wheezing or rales Chest wall: without tenderness to palpitation Heart: PMI not displaced, RRR, s1 s2 normal, 1/6 systolic murmur, no diastolic murmur, no rubs, gallops, thrills, or heaves Abdomen: soft, nontender; no hepatosplenomehaly, BS+; abdominal aorta nontender and not dilated by palpation. Back: no CVA tenderness Pulses 2+ Musculoskeletal: full range of motion, normal strength, no joint deformities Extremities: no clubbing cyanosis or edema, Homan's sign negative  Neurologic: grossly nonfocal; Cranial nerves grossly wnl Psychologic: Normal mood and affect   Labs    Chemistry Recent Labs  Lab 08/19/20 0358 08/20/20 0132 08/21/20 0127  NA 136 138 137  K 3.6 4.1 3.8  CL 99 100 102  CO2 27 31 27   GLUCOSE 117* 150* 104*  BUN 31* 28* 32*  CREATININE 1.21 1.24 1.02  CALCIUM 8.9 8.9 9.0  PROT 6.4*  --   --   ALBUMIN 3.0*  --   --   AST 17  --   --   ALT 20  --   --   ALKPHOS 53  --   --   BILITOT 0.8  --   --  GFRNONAA >60 59* >60  ANIONGAP 10 7 8      Hematology Recent Labs  Lab 08/19/20 0358 08/20/20 0132 08/21/20 0127  WBC 12.1* 8.6 9.0  RBC 4.60 4.51 4.25  HGB 14.6 14.6 13.9  HCT 44.9 43.6 41.1  MCV 97.6 96.7 96.7  MCH 31.7 32.4 32.7  MCHC 32.5 33.5 33.8  RDW 13.6 13.7 13.9  PLT 250 228 250    Cardiac EnzymesNo results for input(s): TROPONINI in the last 168 hours. No results for input(s): TROPIPOC in the last 168 hours.   BNP Recent Labs  Lab 08/19/20 0358  BNP 1,109.1*     DDimer No results for input(s): DDIMER in the last 168 hours.   Lipid Panel     Component Value Date/Time   CHOL 105 08/19/2020 0358   TRIG 82 08/19/2020 0358   HDL 41 08/19/2020 0358   CHOLHDL 2.6 08/19/2020 0358   VLDL 16 08/19/2020 0358   LDLCALC 48 08/19/2020 0358     Radiology    MR CARDIAC MORPHOLOGY W WO CONTRAST  Result Date: 08/19/2020 CLINICAL DATA:  Severe  multivessel CAD, evaluate viability EXAM: CARDIAC MRI TECHNIQUE: The patient was scanned on a 1.5 Tesla Siemens magnet. A dedicated cardiac coil was used. Functional imaging was done using Fiesta sequences. 2,3, and 4 chamber views were done to assess for RWMA's. Modified Simpson's rule using a short axis stack was used to calculate an ejection fraction on a dedicated work 10/19/2020. The patient received 7 cc of Gadavist. After 10 minutes inversion recovery sequences were used to assess for infiltration and scar tissue. CONTRAST:  7cc  of Gadavist FINDINGS: Left ventricle: -Moderate dilatation -Severe systolic dysfunction. Akinesis of mid inferior/inferoseptal/anteroseptal/anterior walls, apical inferior/septal/anterior walls, and apex -LGE: >50% transmural LGE: Mid anteroseptum/inferoseptum, apical anterior/septal/inferior, and apex. <50% transmural LGE: Mid anterior/inferior -RV insertion site LGE LV EF: 28% (Normal 56-78%) Absolute volumes: LV EDV:233 mL (Normal 77-195 mL) LV ESV: Research [employer], [employer] (Normal 19-72 mL) LV SV: 52mL (Normal 51-133 mL) CO: 4.6L/min (Normal 2.8-8.8 L/min) Indexed volumes: LV EDV: 137mL/sq-m (Normal 47-92 mL/sq-m) LV ESV: 43mL/sq-m (Normal 13-30 mL/sq-m) LV SV: 64mL/sq-m (Normal 32-62 mL/sq-m) CI: 2.5L/min/sq-m (Normal 1.7-4.2 L/min/sq-m) Right ventricle: Normal size and systolic function RV EF:  65% (Normal 47-74%) Absolute volumes: RV EDV: 38m (Normal 88-227 mL) RV ESV: 80mL (Normal 23-103 mL) RV SV: 64mL (Normal 52-138 mL) CO: 4.7L/min (Normal 2.8-8.8 L/min) Indexed volumes: RV EDV: 49mL/sq-m (Normal 55-105 mL/sq-m) RV ESV: 6mL/sq-m (Normal 15-43 mL/sq-m) RV SV: 60mL/sq-m (Normal 32-64 mL/sq-m) CI: 2.6L/min/sq-m (Normal 1.7-4.2 L/min/sq-m) Left atrium: Mild enlargement Right atrium: Normal size Mitral valve: Mild regurgitation Aortic valve: Tricuspid.  No regurgitation Tricuspid valve: Mild regurgitation Pulmonic valve: No regurgitation Aorta: Mild dilatation of ascending  aorta measuring 41mm Pericardium: Normal Extracardiac structures: Moderate bilateral pleural effusions IMPRESSION: 1. Moderate LV dilatation with severe systolic dysfunction (EF 28%). Akinesis of mid inferior/inferoseptal/anteroseptal/anterior walls, apical inferior/septal/anterior walls, and apex 2. 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 LGE is <50% transmural suggesting viability in the mid anterior/inferior walls. LGE is >50% transmural suggesting nonviability in the mid anteroseptum/inferoseptum, apical anterior/septal/inferior walls, and apex. 3. RV insertion site LGE, which is a nonspecific finding often seen in setting of elevated pulmonary pressures 4.  Moderate bilateral pleural effusions Electronically Signed   By: 25m MD   On: 08/19/2020 22:44   ECHOCARDIOGRAM COMPLETE  Result Date: 08/19/2020    ECHOCARDIOGRAM REPORT   Patient Name:   TEDDRICK MALLARI Date of Exam: 08/19/2020  Medical Rec #:  [id number]    [id number]       71.0 in Accession #:    [id number]   Weight:       142.8 lb Date of Birth:  09-Oct-1940     BSA:          1.827 m Patient Age:    79 years     BP:           110/69 mmHg Patient Gender: M            HR:           86 bpm. Exam Location:  Inpatient Procedure: 2D Echo, Color Doppler, Limited Color Doppler and Intracardiac            Opacification Agent Indications:    I50.9* Heart failure (unspecified)  History:        Patient has no prior history of Echocardiogram examinations.                 Signs/Symptoms:Chest Pain and Shortness of Breath.  Sonographer:    Roosvelt Maser RDCS Referring Phys: [id number] [facility] IMPRESSIONS  1. Left ventricular ejection fraction, by estimation, is 25 to 30%. The left ventricle has severely decreased function.  2. The left ventricle demonstrates regional wall motion abnormalities . All mid-to-apical septal, mid-to-apical anterolateral, mid-to-apical anterior, apical inferior and apex appear akinetic. The rest of  the LV walls are hypokinetic.  3. There is mild concentric left ventricular hypertrophy. Left ventricular diastolic parameters are consistent with Grade II diastolic dysfunction (pseudonormalization).  4. Swirling of contrast seen in the left ventricle consistent with low flow state. No LV thrombus visualized.  5. Right ventricular systolic function is normal. The right ventricular size is normal.  6. The mitral valve is abnormal. Trivial mitral valve regurgitation. No evidence of mitral stenosis. Moderate mitral annular calcification.  7. The aortic valve has an indeterminant number of cusps. There is moderate calcification of the aortic valve. There is moderate thickening of the aortic valve. Aortic valve regurgitation is not visualized. Mild aortic valve stenosis. Gradients low due to low output state.  8. Wall motion concerning for multivessel coronary artery disease. FINDINGS  Left Ventricle: Left ventricular ejection fraction, by estimation, is 25 to 30%. The left ventricle has severely decreased function. The left ventricle demonstrates regional wall motion abnormalities. The mid-to-apical septal, mid-to-apical anterolateral, mid-to-apical anterior, apical inferior and apex appear akinetic. The rest of the LV walls are hypokinetic. Definity contrast agent was given IV to delineate the left ventricular endocardial borders. The left ventricular internal cavity size was normal in size. There is mild concentric left ventricular hypertrophy. Left ventricular diastolic parameters are consistent with Grade II diastolic dysfunction (pseudonormalization). Right Ventricle: The right ventricular size is normal. No increase in right ventricular wall thickness. Right ventricular systolic function is normal. Left Atrium: Left atrial size was normal in size. Right Atrium: Right atrial size was normal in size. Pericardium: There is no evidence of pericardial effusion. Mitral Valve: The mitral valve is abnormal. There is  moderate thickening of the mitral valve leaflet(s). There is moderate calcification of the mitral valve leaflet(s). Moderate mitral annular calcification. Trivial mitral valve regurgitation. No evidence of mitral valve stenosis. Tricuspid Valve: The tricuspid valve is normal in structure. Tricuspid valve regurgitation is trivial. Aortic Valve: The aortic valve has an indeterminant number of cusps. There is moderate calcification of the aortic valve. There is moderate thickening of the aortic valve. Aortic valve regurgitation is not visualized.  Mild aortic stenosis is present. Aortic valve mean gradient measures 2.0 mmHg. Aortic valve peak gradient measures 3.2 mmHg. Aortic valve area, by VTI measures 3.09 cm. Pulmonic Valve: The pulmonic valve was not well visualized. Pulmonic valve regurgitation is not visualized. Aorta: The aortic root and ascending aorta are structurally normal, with no evidence of dilitation. IAS/Shunts: No atrial level shunt detected by color flow Doppler.  LEFT VENTRICLE PLAX 2D LVIDd:         4.40 cm      Diastology LVIDs:         3.30 cm      LV e' medial:    4.57 cm/s LV PW:         0.90 cm      LV E/e' medial:  20.9 LV IVS:        0.90 cm      LV e' lateral:   7.18 cm/s LVOT diam:     1.90 cm      LV E/e' lateral: 13.3 LV SV:         52 LV SV Index:   29 LVOT Area:     2.84 cm  LV Volumes (MOD) LV vol d, MOD A2C: 129.0 ml LV vol d, MOD A4C: 128.0 ml LV vol s, MOD A2C: 97.0 ml LV vol s, MOD A4C: 104.0 ml LV SV MOD A2C:     32.0 ml LV SV MOD A4C:     128.0 ml LV SV MOD BP:      30.2 ml RIGHT VENTRICLE          IVC RV Basal diam:  3.90 cm  IVC diam: 1.90 cm LEFT ATRIUM             Index       RIGHT ATRIUM           Index LA diam:        4.00 cm 2.19 cm/m  RA Area:     14.00 cm LA Vol (A2C):   59.9 ml 32.78 ml/m RA Volume:   38.90 ml  21.29 ml/m LA Vol (A4C):   61.8 ml 33.82 ml/m LA Biplane Vol: 61.8 ml 33.82 ml/m  AORTIC VALVE AV Area (Vmax):    3.02 cm AV Area (Vmean):   2.71 cm AV  Area (VTI):     3.09 cm AV Vmax:           89.60 cm/s AV Vmean:          68.800 cm/s AV VTI:            0.169 m AV Peak Grad:      3.2 mmHg AV Mean Grad:      2.0 mmHg LVOT Vmax:         95.30 cm/s LVOT Vmean:        65.700 cm/s LVOT VTI:          0.184 m LVOT/AV VTI ratio: 1.09  AORTA Ao Root diam: 3.40 cm Ao Asc diam:  3.20 cm MITRAL VALVE MV Area (PHT): 4.49 cm    SHUNTS MV Decel Time: 169 msec    Systemic VTI:  0.18 m MV E velocity: 95.50 cm/s  Systemic Diam: 1.90 cm MV A velocity: 75.40 cm/s MV E/A ratio:  1.27 Laurance Flatten MD Electronically signed by Laurance Flatten MD Signature Date/Time: 08/19/2020/3:07:53 PM    Final    VAS US DOPPLER PRE CABG  Result Date: 08/21/2020 PREOPERATIVE VASCULAR EVALUATION  Indications:  Pre-CABG. Risk Factors:     Hypertension, hyperlipidemia. Comparison Study: no prior Performing Technologist: Blanch Media RVS  Examination Guidelines: A complete evaluation includes B-mode imaging, spectral Doppler, color Doppler, and power Doppler as needed of all accessible portions of each vessel. Bilateral testing is considered an integral part of a complete examination. Limited examinations for reoccurring indications may be performed as noted.  Right Carotid Findings: +----------+--------+--------+--------+------------+--------+           PSV cm/sEDV cm/sStenosisDescribe    Comments +----------+--------+--------+--------+------------+--------+ CCA Prox  59      18              heterogenous         +----------+--------+--------+--------+------------+--------+ CCA Distal40      10              heterogenous         +----------+--------+--------+--------+------------+--------+ ICA Prox  43      17      1-39%   heterogenous         +----------+--------+--------+--------+------------+--------+ ICA Distal51      18                                   +----------+--------+--------+--------+------------+--------+ ECA       96      10                                    +----------+--------+--------+--------+------------+--------+ Portions of this table do not appear on this page. +----------+--------+-------+--------+------------+           PSV cm/sEDV cmsDescribeArm Pressure +----------+--------+-------+--------+------------+ Subclavian62                                  +----------+--------+-------+--------+------------+ +---------+--------+--+--------+-+---------+ VertebralPSV cm/s32EDV cm/s7Antegrade +---------+--------+--+--------+-+---------+ Left Carotid Findings: +----------+--------+--------+--------+------------+--------+           PSV cm/sEDV cm/sStenosisDescribe    Comments +----------+--------+--------+--------+------------+--------+ CCA Prox  60      15              heterogenous         +----------+--------+--------+--------+------------+--------+ CCA Distal53      14              heterogenous         +----------+--------+--------+--------+------------+--------+ ICA Prox  64      22      1-39%   heterogenous         +----------+--------+--------+--------+------------+--------+ ICA Distal42      17                                   +----------+--------+--------+--------+------------+--------+ ECA       156                                          +----------+--------+--------+--------+------------+--------+ +----------+--------+--------+--------+------------+ SubclavianPSV cm/sEDV cm/sDescribeArm Pressure +----------+--------+--------+--------+------------+           82                                   +----------+--------+--------+--------+------------+ +---------+--------+--+--------+--+---------+ VertebralPSV cm/s43EDV  cm/s10Antegrade +---------+--------+--+--------+--+---------+  ABI Findings: +--------+------------------+-----+----------+--------+ Right   Rt Pressure (mmHg)IndexWaveform  Comment  +--------+------------------+-----+----------+--------+  Brachial120                    triphasic          +--------+------------------+-----+----------+--------+ PTA     97                0.81 monophasic         +--------+------------------+-----+----------+--------+ DP      111               0.92 monophasic         +--------+------------------+-----+----------+--------+ +--------+------------------+-----+---------+-------+ Left    Lt Pressure (mmHg)IndexWaveform Comment +--------+------------------+-----+---------+-------+ [id number]                    triphasic        +--------+------------------+-----+---------+-------+ PTA     146               1.22 triphasic        +--------+------------------+-----+---------+-------+ DP      141               1.18 triphasic        +--------+------------------+-----+---------+-------+ +-------+---------------+----------------+ ABI/TBIToday's ABI/TBIPrevious ABI/TBI +-------+---------------+----------------+ Right  0.92                            +-------+---------------+----------------+ Left   1.22                            +-------+---------------+----------------+  Right Doppler Findings: +--------+--------+-----+---------+--------+ Site    PressureIndexDoppler  Comments +--------+--------+-----+---------+--------+ Brachial120          triphasic         +--------+--------+-----+---------+--------+ Radial               triphasic         +--------+--------+-----+---------+--------+ Ulnar                triphasic         +--------+--------+-----+---------+--------+  Left Doppler Findings: +--------+--------+-----+---------+--------+ Site    PressureIndexDoppler  Comments +--------+--------+-----+---------+--------+ [id number]          triphasic         +--------+--------+-----+---------+--------+ Radial               triphasic         +--------+--------+-----+---------+--------+ Ulnar                triphasic          +--------+--------+-----+---------+--------+  Summary: Right Carotid: Velocities in the right ICA are consistent with a 1-39% stenosis. Left Carotid: Velocities in the left ICA are consistent with a 1-39% stenosis. Vertebrals: Bilateral vertebral arteries demonstrate antegrade flow. Right ABI: Resting right ankle-brachial index indicates mild right lower extremity arterial disease. Left ABI: Resting left ankle-brachial index is within normal range. No evidence of significant left lower extremity arterial disease. Right Upper Extremity: Doppler waveforms remain within normal limits with right radial compression. Doppler waveforms remain within normal limits with right ulnar compression. Left Upper Extremity: Doppler waveforms remain within normal limits with left radial compression. Doppler waveforms remain within normal limits with left ulnar compression.     Preliminary     Cardiac Studies   CARDIAC MRI IMPRESSION: 1. Moderate LV dilatation with severe systolic dysfunction (EF 28%). Akinesis of mid inferior/inferoseptal/anteroseptal/anterior walls, apical inferior/septal/anterior walls, and apex 2. Subendocardial  late gadolinium enhancement consistent with prior infarcts. LGE is <50% transmural suggesting viability in the mid anterior/inferior walls. LGE is >50% transmural suggesting nonviability in the mid anteroseptum/inferoseptum, apical anterior/septal/inferior walls, and apex. 3. RV insertion site LGE, which is a nonspecific finding often seen in setting of elevated pulmonary pressures 4.  Moderate bilateral pleural effusions  ECHO IMPRESSIONS  1. Left ventricular ejection fraction, by estimation, is 25 to 30%. The left ventricle has severely decreased function.  2. The left ventricle demonstrates regional wall motion abnormalities . All mid-to-apical septal, mid-to-apical anterolateral, mid-to-apical anterior, apical inferior and apex appear akinetic. The rest of the LV walls are hypokinetic.  3.  There is mild concentric left ventricular hypertrophy. Left ventricular diastolic parameters are consistent with Grade II diastolic dysfunction (pseudonormalization).  4. Swirling of contrast seen in the left ventricle consistent with low flow state. No LV thrombus visualized.  5. Right ventricular systolic function is normal. The right ventricular size is normal.  6. The mitral valve is abnormal. Trivial mitral valve regurgitation. No evidence of mitral stenosis. Moderate mitral annular calcification.  7. The aortic valve has an indeterminant number of cusps. There is moderate calcification of the aortic valve. There is moderate thickening of the aortic valve. Aortic valve regurgitation is not visualized. Mild aortic valve stenosis. Gradients low due to low output state.  8. Wall motion concerning for multivessel coronary artery disease.  Patient Profile     Mr. Skirvin is a 80 yo CM w/ PMH significant for BPH and HTN admitted to [facility] on 08/14/20- 08/18/2020 for hypoxia, ADHF, new HFrEF and MVD. He is subsequently transferred to [facility] for evaluation for CABG.   Assessment & Plan    1.  Severe calcified multivessel CAD: Outside films have been linked there is severe multivessel disease percent distal left main stenosis, 75 to 80% proximal LAD stenosis with subtotal mid LAD occlusion with reduced TIMI flow, ostial diagonal stenosis, subtotal circumflex as well as severe RCA disease.  He was evaluated yesterday by Dr. Laneta Simmers.  Tentative plans are for CABG revascularization surgery this Friday, August 23, 2020.  The patient did experience mild chest discomfort this morning which lasted approximately 5 minutes.  We will titrate low dose isosorbide from 15 mg up to 30 mg daily as blood pressure allows.  If recurrent symptomatology will initiate IV nitroglycerin.  ECG done this morning is stable showing normal sinus rhythm at 65 with QS complex V1 through V3, no ectopy and normal intervals  2.   Ischemic cardiomyopathy/HFrEF: I/O -3296 since admission.  Cardiac MRI performed yesterday  shows moderate LV dilation with EF at 28%.  There is akinesis of the mid inferior/inferoseptal/anteroseptal and anterior walls with apical inferior/septal anterior walls and apex.  There is some viability noted in the mid anterior/inferior walls.  There is nonviability in the mid anteroseptal, inferoseptal, apical anterior/septal inferior walls and apex.  Pressure today is stable but on the low side.  He is now on carvedilol 6.25 mg twice a day, isosorbide 15 mg losartan 12.5 mg daily.  He is now on reduced dose of furosemide at 20 mg daily.  We will titrate isosorbide to 30 mg.  Plan ultimately to initiate spironolactone transition losartan to [facility] following revascularization with anticipated  residual LV dysfunction; for that LV function will somewhat improve particularly in the areas of viability detected on cardiac MRI.  3.  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  LDL now excellent at 48.    Signed,  Lennette Bihari, MD, [facility] 08/21/2020, 11:10 AM

## 2020-08-21 NOTE — Progress Notes (Signed)
CARDIAC REHAB PHASE I   PRE:  Rate/Rhythm: 83 SR  BP:  Supine:   Sitting: 103/69  Standing:    SaO2: 93%RA  MODE:  Ambulation: 470 ft   POST:  Rate/Rhythm: 94 SR  BP:  Supine:   Sitting: 101/62  Standing:    SaO2: 95%RA 1020-1120 Pt walked 470 ft on RA with asst x 1. I managed IV pole and monitor. Stopped once to rest. No c/o CP. Tolerated well. Offered recliner after walk but pt wanted to go back to bed. Gave [facility] booklet, care guide, and staying in the tube handout. Discussed with pt staying in the tube and sternal precautions. Discussed importance of IS and walking after surgery. Gave IS and pt reached [phone] ml correctly. Encouraged him to practice. Discussed with pt that surgeon usually likes for pt to have someone with them first week home. He stated he lives alone but has brother who lives in [place] that might could help. Will need to address home situation with pt again after surgery. Wrote down how to view pre op video in case pt wants to view. Not interested in viewing at this time.   Luetta Nutting, RN BSN  08/21/2020 11:19 AM

## 2020-08-21 NOTE — Progress Notes (Signed)
Pre cabg has been completed.   Preliminary results in CV Proc.   Blanch Media 08/21/2020 9:23 AM

## 2020-08-21 NOTE — Progress Notes (Signed)
   08/21/20 1450  Therapy Vitals     Mobility  Activity Refused mobility (Pt requested to ambulate later this evening with nursing staff)

## 2020-08-21 NOTE — Progress Notes (Signed)
ANTICOAGULATION CONSULT NOTE - Follow Up Consult  Pharmacy Consult for IV heparin Indication: CAD awaiting CABG  No Known Allergies  Patient Measurements: Height: 5\' 11"  (180.3 cm) Weight: 65 kg (143 lb 4.8 oz) IBW/kg (Calculated) : 75.3 Heparin Dosing Weight: 64.8 kg  Vital Signs: Temp: 98.2 F (36.8 C) (03/09 0500) Temp Source: Oral (03/09 0500) BP: 106/60 (03/09 0500) Pulse Rate: 68 (03/09 0500)  Labs: Recent Labs    08/19/20 0358 08/19/20 1308 08/20/20 0132 08/21/20 0127  HGB 14.6  --  14.6 13.9  HCT 44.9  --  43.6 41.1  PLT 250  --  228 250  APTT 45*  --   --   --   LABPROT 13.7  --   --   --   INR 1.1  --   --   --   HEPARINUNFRC  --  0.19* 0.49 0.50  CREATININE 1.21  --  1.24 1.02    Estimated Creatinine Clearance: 54 mL/min (by C-G formula based on SCr of 1.02 mg/dL).   Medications:  Infusions:  . heparin 1,400 Units/hr (08/20/20 2222)    Assessment: 80 year old male presented to OSH on 3/2 for h/o CP since Dec and hypoxia noted on outpt OV, subsequently had cardiac cath revealing 3VD, now transferred to [facility] for CABG evaluation, continued on heparin dosed by pharmacy.  Heparin level today is therapeutic at 0.50 on 1400 units/hr. CBC is wnl and stable. No s/sx bleeding reported. CABG is scheduled for 3/11.  Goal of Therapy:  Heparin level 0.3-0.7 units/ml Monitor platelets by anticoagulation protocol: Yes   Plan:  Continue heparin at 1400 units/hour Monitor daily heparin level, CBC, s/sx bleeding  5/11, PharmD PGY-1 Pharmacy Resident 08/21/2020 7:29 AM Please see AMION for all pharmacy numbers

## 2020-08-22 DIAGNOSIS — E785 Hyperlipidemia, unspecified: Secondary | ICD-10-CM | POA: Diagnosis not present

## 2020-08-22 DIAGNOSIS — I509 Heart failure, unspecified: Secondary | ICD-10-CM

## 2020-08-22 DIAGNOSIS — I255 Ischemic cardiomyopathy: Secondary | ICD-10-CM | POA: Diagnosis not present

## 2020-08-22 DIAGNOSIS — I25110 Atherosclerotic heart disease of native coronary artery with unstable angina pectoris: Secondary | ICD-10-CM

## 2020-08-22 LAB — CBC
HCT: 42.1 % (ref 39.0–52.0)
Hemoglobin: 14 g/dL (ref 13.0–17.0)
MCH: 32.6 pg (ref 26.0–34.0)
MCHC: 33.3 g/dL (ref 30.0–36.0)
MCV: 98.1 fL (ref 80.0–100.0)
Platelets: 236 10*3/uL (ref 150–400)
RBC: 4.29 MIL/uL (ref 4.22–5.81)
RDW: 14.1 % (ref 11.5–15.5)
WBC: 8.8 10*3/uL (ref 4.0–10.5)
nRBC: 0 % (ref 0.0–0.2)

## 2020-08-22 LAB — URINALYSIS, ROUTINE W REFLEX MICROSCOPIC
Bilirubin Urine: NEGATIVE
Glucose, UA: NEGATIVE mg/dL
Hgb urine dipstick: NEGATIVE
Ketones, ur: NEGATIVE mg/dL
Nitrite: POSITIVE — AB
Protein, ur: NEGATIVE mg/dL
Specific Gravity, Urine: 1.023 (ref 1.005–1.030)
WBC, UA: >50 WBC/hpf — ABNORMAL HIGH (ref 0–5)
pH: 5 (ref 5.0–8.0)

## 2020-08-22 LAB — BLOOD GAS, ARTERIAL
Acid-Base Excess: 3.2 mmol/L — ABNORMAL HIGH (ref 0.0–2.0)
Bicarbonate: 27.3 mmol/L (ref 20.0–28.0)
FIO2: 28
O2 Saturation: 98.8 %
Patient temperature: 36.4
pCO2 arterial: 41.4 mmHg (ref 32.0–48.0)
pH, Arterial: 7.432 (ref 7.350–7.450)
pO2, Arterial: 126 mmHg — ABNORMAL HIGH (ref 83.0–108.0)

## 2020-08-22 LAB — ABO/RH: ABO/RH(D): O POS

## 2020-08-22 LAB — SURGICAL PCR SCREEN
MRSA, PCR: NEGATIVE
Staphylococcus aureus: NEGATIVE

## 2020-08-22 LAB — HEPARIN LEVEL (UNFRACTIONATED): Heparin Unfractionated: 0.69 IU/mL (ref 0.30–0.70)

## 2020-08-22 MED ORDER — TRANEXAMIC ACID 1000 MG/10ML IV SOLN
1.5000 mg/kg/h | INTRAVENOUS | Status: AC
  Administered 2020-08-23: 1.5 mg/kg/h via INTRAVENOUS
  Filled 2020-08-22: qty 25

## 2020-08-22 MED ORDER — NITROGLYCERIN IN D5W 200-5 MCG/ML-% IV SOLN
2.0000 ug/min | INTRAVENOUS | Status: DC
  Filled 2020-08-22: qty 250

## 2020-08-22 MED ORDER — INSULIN REGULAR(HUMAN) IN NACL 100-0.9 UT/100ML-% IV SOLN
INTRAVENOUS | Status: AC
  Administered 2020-08-23: .9 [IU]/h via INTRAVENOUS
  Filled 2020-08-22: qty 100

## 2020-08-22 MED ORDER — TRANEXAMIC ACID (OHS) BOLUS VIA INFUSION
15.0000 mg/kg | INTRAVENOUS | Status: AC
  Administered 2020-08-23: 972 mg via INTRAVENOUS
  Filled 2020-08-22: qty 972

## 2020-08-22 MED ORDER — SODIUM CHLORIDE 0.9 % IV SOLN
INTRAVENOUS | Status: DC
  Filled 2020-08-22: qty 30

## 2020-08-22 MED ORDER — MILRINONE LACTATE IN DEXTROSE 20-5 MG/100ML-% IV SOLN
0.3000 ug/kg/min | INTRAVENOUS | Status: AC
  Administered 2020-08-23: .25 ug/kg/min via INTRAVENOUS
  Filled 2020-08-22: qty 100

## 2020-08-22 MED ORDER — BISACODYL 5 MG PO TBEC: 5.0000 mg | DELAYED_RELEASE_TABLET | Freq: Once | ORAL | Status: DC

## 2020-08-22 MED ORDER — DIAZEPAM 2 MG PO TABS
2.0000 mg | ORAL_TABLET | Freq: Once | ORAL | Status: AC
  Administered 2020-08-23: 2 mg via ORAL
  Filled 2020-08-22: qty 1

## 2020-08-22 MED ORDER — VANCOMYCIN HCL 1250 MG/250ML IV SOLN
1250.0000 mg | INTRAVENOUS | Status: AC
  Administered 2020-08-23: 1250 mg via INTRAVENOUS
  Filled 2020-08-22: qty 250

## 2020-08-22 MED ORDER — POTASSIUM CHLORIDE 2 MEQ/ML IV SOLN
80.0000 meq | INTRAVENOUS | Status: DC
  Filled 2020-08-22: qty 40

## 2020-08-22 MED ORDER — CHLORHEXIDINE GLUCONATE 0.12 % MT SOLN
15.0000 mL | Freq: Once | OROMUCOSAL | Status: AC
  Administered 2020-08-23: 15 mL via OROMUCOSAL
  Filled 2020-08-22: qty 15

## 2020-08-22 MED ORDER — METOPROLOL TARTRATE 12.5 MG HALF TABLET
12.5000 mg | ORAL_TABLET | Freq: Once | ORAL | Status: AC
  Administered 2020-08-23: 12.5 mg via ORAL
  Filled 2020-08-22: qty 1

## 2020-08-22 MED ORDER — TEMAZEPAM 15 MG PO CAPS: 15.0000 mg | ORAL_CAPSULE | Freq: Once | ORAL | Status: DC | PRN

## 2020-08-22 MED ORDER — EPINEPHRINE HCL 5 MG/250ML IV SOLN IN NS
0.0000 ug/min | INTRAVENOUS | Status: DC
  Filled 2020-08-22: qty 250

## 2020-08-22 MED ORDER — TRANEXAMIC ACID (OHS) PUMP PRIME SOLUTION
2.0000 mg/kg | INTRAVENOUS | Status: DC
  Filled 2020-08-22: qty 1.3

## 2020-08-22 MED ORDER — SODIUM CHLORIDE 0.9 % IV SOLN
1.5000 g | INTRAVENOUS | Status: AC
  Administered 2020-08-23: 1.5 g via INTRAVENOUS
  Filled 2020-08-22: qty 1.5

## 2020-08-22 MED ORDER — CHLORHEXIDINE GLUCONATE CLOTH 2 % EX PADS
6.0000 | MEDICATED_PAD | Freq: Once | CUTANEOUS | Status: AC
  Administered 2020-08-23: 6 via TOPICAL

## 2020-08-22 MED ORDER — SODIUM CHLORIDE 0.9 % IV SOLN
750.0000 mg | INTRAVENOUS | Status: AC
  Administered 2020-08-23: 750 mg via INTRAVENOUS
  Filled 2020-08-22: qty 750

## 2020-08-22 MED ORDER — DEXMEDETOMIDINE HCL IN NACL 400 MCG/100ML IV SOLN
0.1000 ug/kg/h | INTRAVENOUS | Status: AC
  Administered 2020-08-23: .7 ug/kg/h via INTRAVENOUS
  Filled 2020-08-22: qty 100

## 2020-08-22 MED ORDER — CHLORHEXIDINE GLUCONATE CLOTH 2 % EX PADS
6.0000 | MEDICATED_PAD | Freq: Once | CUTANEOUS | Status: AC
  Administered 2020-08-22: 6 via TOPICAL

## 2020-08-22 MED ORDER — NOREPINEPHRINE 4 MG/250ML-% IV SOLN
0.0000 ug/min | INTRAVENOUS | Status: DC
  Filled 2020-08-22: qty 250

## 2020-08-22 MED ORDER — MAGNESIUM SULFATE 50 % IJ SOLN
40.0000 meq | INTRAMUSCULAR | Status: DC
  Filled 2020-08-22: qty 9.85

## 2020-08-22 MED ORDER — PHENYLEPHRINE HCL-NACL 20-0.9 MG/250ML-% IV SOLN
30.0000 ug/min | INTRAVENOUS | Status: AC
  Administered 2020-08-23: 50 ug/min via INTRAVENOUS
  Filled 2020-08-22: qty 250

## 2020-08-22 MED ORDER — PLASMA-LYTE 148 IV SOLN
INTRAVENOUS | Status: DC
  Filled 2020-08-22: qty 2.5

## 2020-08-22 NOTE — TOC Benefit Eligibility Note (Signed)
Transition of Care [facility]) Benefit Eligibility Note    Patient Details  Name: Paul [name] MRN: [id number] Date of Birth: April 05, 1941   Medication/Dose: Delene Loll 24-26 MG BID  CO-PAY-$526.00  ,  FARXIGA 10 MG DAILY CO-PAY- $256.00  ,  JARDIANCE 10 MG DAILY  CO-PAY- $526.00  Covered?: Yes  Tier: 3 Drug  Prescription Coverage Preferred Pharmacy: [facility]  Spoke with Person/Company/Phone Number:: BEN   @  [employer] #  [phone]  Co-Pay: $526.00  Prior Approval: No  Deductible: Unmet (OUT-OF-POCKET:UNMET)  Additional Notes: AFTER THE DEDUCTIBLE HAS BEEN MET COST FOR EACH PRESCRIPTION  WILL BE  $47.00    Memory Argue Phone Number: 08/22/2020, 11:00 AM

## 2020-08-22 NOTE — Progress Notes (Signed)
ANTICOAGULATION CONSULT NOTE - Follow Up Consult  Pharmacy Consult for IV heparin Indication: CAD awaiting CABG  No Known Allergies  Patient Measurements: Height: 5\' 11"  (180.3 cm) Weight: 64.8 kg (142 lb 14.4 oz) IBW/kg (Calculated) : 75.3 Heparin Dosing Weight: 64.8 kg  Vital Signs: Temp: 97.7 F (36.5 C) (03/10 0459) Temp Source: Oral (03/10 0459) BP: 103/67 (03/10 0459) Pulse Rate: 79 (03/10 0459)  Labs: Recent Labs    08/20/20 0132 08/21/20 0127 08/22/20 0234  HGB 14.6 13.9 14.0  HCT 43.6 41.1 42.1  PLT 228 250 236  HEPARINUNFRC 0.49 0.50 0.69  CREATININE 1.24 1.02  --     Estimated Creatinine Clearance: 53.8 mL/min (by C-G formula based on SCr of 1.02 mg/dL).   Medications:  Infusions:  . heparin 1,400 Units/hr (08/21/20 1749)    Assessment: 80 year old male presented to OSH on 3/2 for h/o CP since Dec and hypoxia noted on outpt OV, subsequently had cardiac cath revealing 3VD, now transferred to [facility] for CABG evaluation, continued on heparin dosed by pharmacy.  Heparin level today is therapeutic at 0.69 on 1400 units/hr. CBC is wnl and stable. No s/sx bleeding reported. CABG is scheduled for 3/11.  Goal of Therapy:  Heparin level 0.3-0.7 units/ml Monitor platelets by anticoagulation protocol: Yes   Plan:  Continue heparin at 1400 units/hour Monitor daily heparin level, CBC, s/sx bleeding  5/11, PharmD PGY-1 Pharmacy Resident 08/22/2020 7:03 AM Please see AMION for all pharmacy numbers

## 2020-08-22 NOTE — Progress Notes (Addendum)
Progress Note  Patient Name: Paul [name] Date of Encounter: 08/22/2020  [facility] Cardiologist: No primary care provider on file.   Subjective   Doing well this morning. No complaints. Sitting up in bed. Planned for CABG tomorrow  Inpatient Medications    Scheduled Meds: . aspirin EC  81 mg Oral Daily  . carvedilol  6.25 mg Oral BID WC  . feeding supplement  237 mL Oral BID BM  . finasteride  5 mg Oral Daily  . furosemide  20 mg Oral Daily  . isosorbide mononitrate  15 mg Oral Daily  . losartan  12.5 mg Oral Daily  . multivitamin with minerals  1 tablet Oral Daily  . rosuvastatin  40 mg Oral Daily   Continuous Infusions: . heparin 1,400 Units/hr (08/21/20 1749)   PRN Meds: acetaminophen, nitroGLYCERIN, ondansetron (ZOFRAN) IV   Vital Signs    Vitals:   08/21/20 2043 08/21/20 2313 08/22/20 0228 08/22/20 0459  BP: 96/68 109/74 101/74 103/67  Pulse: 67 81 81 79  Resp: 18 18 16 16   Temp: 98.3 F (36.8 C) 98 F (36.7 C) (!) 97.5 F (36.4 C) 97.7 F (36.5 C)  TempSrc: Oral Oral Oral Oral  SpO2: 96% 93% 98%   Weight:   64.8 kg   Height:        Intake/Output Summary (Last 24 hours) at 08/22/2020 0835 Last data filed at 08/22/2020 0500 Gross per 24 hour  Intake 304 ml  Output 1325 ml  Net -1021 ml   Last 3 Weights 08/22/2020 08/21/2020 08/20/2020  Weight (lbs) 142 lb 14.4 oz 143 lb 4.8 oz 142 lb 4.8 oz  Weight (kg) 64.819 kg 65 kg 64.547 kg      Telemetry    SR - Personally Reviewed  ECG    No new tracing this morning  Physical Exam   GEN: No acute distress.   Neck: No JVD Cardiac: RRR, no murmurs, rubs, or gallops.  Respiratory: Clear to auscultation bilaterally. GI: Soft, nontender, non-distended  MS: No edema; No deformity. Right radial cath site stable. Neuro:  Nonfocal  Psych: Normal affect   Labs    High Sensitivity Troponin:  No results for input(s): TROPONINIHS in the last 720 hours.    Chemistry Recent Labs  Lab 08/19/20 0358  08/20/20 0132 08/21/20 0127  NA 136 138 137  K 3.6 4.1 3.8  CL 99 100 102  CO2 27 31 27   GLUCOSE 117* 150* 104*  BUN 31* 28* 32*  CREATININE 1.21 1.24 1.02  CALCIUM 8.9 8.9 9.0  PROT 6.4*  --   --   ALBUMIN 3.0*  --   --   AST 17  --   --   ALT 20  --   --   ALKPHOS 53  --   --   BILITOT 0.8  --   --   GFRNONAA >60 59* >60  ANIONGAP 10 7 8      Hematology Recent Labs  Lab 08/20/20 0132 08/21/20 0127 08/22/20 0234  WBC 8.6 9.0 8.8  RBC 4.51 4.25 4.29  HGB 14.6 13.9 14.0  HCT 43.6 41.1 42.1  MCV 96.7 96.7 98.1  MCH 32.4 32.7 32.6  MCHC 33.5 33.8 33.3  RDW 13.7 13.9 14.1  PLT 228 250 236    BNP Recent Labs  Lab 08/19/20 0358  BNP 1,109.1*     DDimer No results for input(s): DDIMER in the last 168 hours.   Radiology    VAS 10/21/20 DOPPLER PRE  CABG  Result Date: 08/21/2020 PREOPERATIVE VASCULAR EVALUATION  Indications:      Pre-CABG. Risk Factors:     Hypertension, hyperlipidemia. Comparison Study: no prior Performing Technologist: Blanch Media RVS  Examination Guidelines: A complete evaluation includes B-mode imaging, spectral Doppler, color Doppler, and power Doppler as needed of all accessible portions of each vessel. Bilateral testing is considered an integral part of a complete examination. Limited examinations for reoccurring indications may be performed as noted.  Right Carotid Findings: +----------+--------+--------+--------+------------+--------+           PSV cm/sEDV cm/sStenosisDescribe    Comments +----------+--------+--------+--------+------------+--------+ CCA Prox  59      18              heterogenous         +----------+--------+--------+--------+------------+--------+ CCA Distal40      10              heterogenous         +----------+--------+--------+--------+------------+--------+ ICA Prox  43      17      1-39%   heterogenous         +----------+--------+--------+--------+------------+--------+ ICA Distal51      18                                    +----------+--------+--------+--------+------------+--------+ ECA       96      10                                   +----------+--------+--------+--------+------------+--------+ Portions of this table do not appear on this page. +----------+--------+-------+--------+------------+           PSV cm/sEDV cmsDescribeArm Pressure +----------+--------+-------+--------+------------+ Subclavian62                                  +----------+--------+-------+--------+------------+ +---------+--------+--+--------+-+---------+ VertebralPSV cm/s32EDV cm/s7Antegrade +---------+--------+--+--------+-+---------+ Left Carotid Findings: +----------+--------+--------+--------+------------+--------+           PSV cm/sEDV cm/sStenosisDescribe    Comments +----------+--------+--------+--------+------------+--------+ CCA Prox  60      15              heterogenous         +----------+--------+--------+--------+------------+--------+ CCA Distal53      14              heterogenous         +----------+--------+--------+--------+------------+--------+ ICA Prox  64      22      1-39%   heterogenous         +----------+--------+--------+--------+------------+--------+ ICA Distal42      17                                   +----------+--------+--------+--------+------------+--------+ ECA       156                                          +----------+--------+--------+--------+------------+--------+ +----------+--------+--------+--------+------------+ SubclavianPSV cm/sEDV cm/sDescribeArm Pressure +----------+--------+--------+--------+------------+           82                                   +----------+--------+--------+--------+------------+ +---------+--------+--+--------+--+---------+  VertebralPSV cm/s43EDV cm/s10Antegrade +---------+--------+--+--------+--+---------+  ABI Findings:  +--------+------------------+-----+----------+--------+ Right   Rt Pressure (mmHg)IndexWaveform  Comment  +--------+------------------+-----+----------+--------+ Brachial120                    triphasic          +--------+------------------+-----+----------+--------+ PTA     97                0.81 monophasic         +--------+------------------+-----+----------+--------+ DP      111               0.92 monophasic         +--------+------------------+-----+----------+--------+ +--------+------------------+-----+---------+-------+ Left    Lt Pressure (mmHg)IndexWaveform Comment +--------+------------------+-----+---------+-------+ [id number]                    triphasic        +--------+------------------+-----+---------+-------+ PTA     146               1.22 triphasic        +--------+------------------+-----+---------+-------+ DP      141               1.18 triphasic        +--------+------------------+-----+---------+-------+ +-------+---------------+----------------+ ABI/TBIToday's ABI/TBIPrevious ABI/TBI +-------+---------------+----------------+ Right  0.92                            +-------+---------------+----------------+ Left   1.22                            +-------+---------------+----------------+  Right Doppler Findings: +--------+--------+-----+---------+--------+ Site    PressureIndexDoppler  Comments +--------+--------+-----+---------+--------+ [id number]          triphasic         +--------+--------+-----+---------+--------+ Radial               triphasic         +--------+--------+-----+---------+--------+ Ulnar                triphasic         +--------+--------+-----+---------+--------+  Left Doppler Findings: +--------+--------+-----+---------+--------+ Site    PressureIndexDoppler  Comments +--------+--------+-----+---------+--------+ [id number]          triphasic          +--------+--------+-----+---------+--------+ Radial               triphasic         +--------+--------+-----+---------+--------+ Ulnar                triphasic         +--------+--------+-----+---------+--------+  Summary: Right Carotid: Velocities in the right ICA are consistent with a 1-39% stenosis. Left Carotid: Velocities in the left ICA are consistent with a 1-39% stenosis. Vertebrals: Bilateral vertebral arteries demonstrate antegrade flow. Right ABI: Resting right ankle-brachial index indicates mild right lower extremity arterial disease. Left ABI: Resting left ankle-brachial index is within normal range. No evidence of significant left lower extremity arterial disease. Right Upper Extremity: Doppler waveforms remain within normal limits with right radial compression. Doppler waveforms remain within normal limits with right ulnar compression. Left Upper Extremity: Doppler waveforms remain within normal limits with left radial compression. Doppler waveforms remain within normal limits with left ulnar compression.  Electronically signed by Coral ElseVance Brabham MD on 08/21/2020 at 10:10:12 PM.    Final     Cardiac Studies   CARDIAC MRI IMPRESSION: 1. Moderate LV dilatation with severe systolic dysfunction (EF  28%). Akinesis of mid inferior/inferoseptal/anteroseptal/anterior walls, apical inferior/septal/anterior walls, and apex 2. 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 LGE is <50% transmural suggesting viability in the mid anterior/inferior walls. LGE is >50% transmural suggesting nonviability in the mid anteroseptum/inferoseptum, apical anterior/septal/inferior walls, and apex. 3. RV insertion site LGE, which is a nonspecific finding often seen in setting of elevated pulmonary pressures 4.  Moderate bilateral pleural effusions  ECHO IMPRESSIONS  1. Left ventricular ejection fraction, by estimation, is 25 to 30%. The left ventricle has severely decreased  function.  2. The left ventricle demonstrates regional wall motion abnormalities . All mid-to-apical septal, mid-to-apical anterolateral, mid-to-apical anterior, apical inferior and apex appear akinetic. The rest of the LV walls are hypokinetic.  3. There is mild concentric left ventricular hypertrophy. Left ventricular diastolic parameters are consistent with Grade II diastolic dysfunction (pseudonormalization).  4. Swirling of contrast seen in the left ventricle consistent with low flow state. No LV thrombus visualized.  5. Right ventricular systolic function is normal. The right ventricular size is normal.  6. The mitral valve is abnormal. Trivial mitral valve regurgitation. No evidence of mitral stenosis. Moderate mitral annular calcification.  7. The aortic valve has an indeterminant number of cusps. There is moderate calcification of the aortic valve. There is moderate thickening of the aortic valve. Aortic valve regurgitation is not visualized. Mild aortic valve stenosis. Gradients low due to low output state.  8. Wall motion concerning for multivessel coronary artery disease.  Patient Profile     80 y.o. male 80 yo CM w/ PMH significant for BPH and HTN admitted to [facility] on 08/14/20-08/18/2020 for hypoxia, ADHF, new HFrEF and MVD. He was subsequently transferred to [facility] for evaluation for CABG.   Assessment & Plan    1. NSTEMI:  Outside films linked to chart. Notable severe multivessel disease percent distal left main stenosis, 75 to 80% proximal LAD stenosis with subtotal mid LAD occlusion with reduced TIMI flow, ostial diagonal stenosis, subtotal circumflex as well as severe RCA disease.  -- evaluated by [facility], Dr. Laneta Simmers with plans for CABG tomorrow.  -- continue ASA, statin, coreg 6.25mg  BID, losartan 12.5mg  daily and Imdur  2. ICM: Echo demonstrates EF of 25-30% with mid to apical, anterolateral and apex akinesis. Cardiac MRI showed moderate LV dilation with EF of 28%. Some  viability in the anterior/inferior walls. Nonviability in mid anteroseptal, inferoseptal and apical/septal inferior walls and apex.  -- initially diuresed with IV lasix, but transitioned to lasix 20mg  daily -- tolerating addition of coreg 6.25mg  BID, losartan 12.5 mg daily and Imdur 15mg  daily. Unable to further titrate medications in the setting of soft blood pressures.  -- consider Entresto/spiro during hospital course pending BP reading post op  3. HLD: LDL 48 -- on Crestor 40mg  daily  For questions or updates, please contact [facility] Please consult [email or web address] for contact info under        Signed, , NP  08/22/2020, 8:35 AM     Patient seen and examined. Agree with assessment and plan. Feels well today; no recurrent chest pain. For CABG tomorrow am with Dr. , MD, [facility] 08/22/2020 9:19 AM

## 2020-08-22 NOTE — Progress Notes (Signed)
CARDIAC REHAB PHASE I   PRE:  Rate/Rhythm: 71 SR  BP:  Supine:   Sitting: 102/75  Standing:    SaO2: 94-97%RA  MODE:  Ambulation: 550 ft   POST:  Rate/Rhythm: 92 SR  BP:  Supine:   Sitting: 100/67  Standing:    SaO2: 96%RA 1014-1040 Pt walked 550 ft on RA with steady gait and no CP. Tolerated well. To recliner with call bell. Will follow up after surgery.    Luetta Nutting, RN BSN  08/22/2020 10:35 AM

## 2020-08-22 NOTE — Care Management (Signed)
1155 08-22-20 Benefits check submitted for Entresto 24/26 mg BID, Farxiga 10 mg, and Jardiance 10 mg. Case Manager will follow for cost. Graves-Bigelow, Lamar Laundry, RN, BSN Case Manager

## 2020-08-22 NOTE — Anesthesia Preprocedure Evaluation (Addendum)
Anesthesia Evaluation  Patient identified by MRN, date of birth, ID band Patient awake    Reviewed: Allergy & Precautions, NPO status , Patient's Chart, lab work & pertinent test results  History of Anesthesia Complications Negative for: history of anesthetic complications  Airway Mallampati: II  TM Distance: >3 FB Neck ROM: Full    Dental no notable dental hx. (+) Dental Advisory Given   Pulmonary neg pulmonary ROS,    Pulmonary exam normal        Cardiovascular hypertension, Pt. on medications + CAD and +CHF  Normal cardiovascular exam  IMPRESSIONS   1. Left ventricular ejection fraction, by estimation, is 25 to 30%. The left ventricle has severely decreased function. 2. The left ventricle demonstrates regional wall motion abnormalities . All mid-to-apical septal, mid-to-apical anterolateral, mid-to-apical anterior, apical inferior and apex appear akinetic. The rest of the LV walls are hypokinetic. 3. There is mild concentric left ventricular hypertrophy. Left ventricular diastolic parameters are consistent with Grade II diastolic dysfunction (pseudonormalization). 4. Swirling of contrast seen in the left ventricle consistent with low flow state. No LV thrombus visualized. 5. Right ventricular systolic function is normal. The right ventricular size is normal. 6. The mitral valve is abnormal. Trivial mitral valve regurgitation. No evidence of mitral stenosis. Moderate mitral annular calcification. 7. The aortic valve has an indeterminant number of cusps. There is moderate calcification of the aortic valve. There is moderate thickening of the aortic valve. Aortic valve regurgitation is not visualized. Mild aortic valve stenosis. Gradients low due  to low output state. 8. Wall motion concerning for multivessel coronary artery disease.   Neuro/Psych negative neurological ROS     GI/Hepatic negative GI ROS, Neg liver ROS,    Endo/Other  negative endocrine ROS  Renal/GU negative Renal ROS     Musculoskeletal negative musculoskeletal ROS (+)   Abdominal   Peds  Hematology negative hematology ROS (+)   Anesthesia Other Findings   Reproductive/Obstetrics                            Anesthesia Physical Anesthesia Plan  ASA: IV  Anesthesia Plan: General   Post-op Pain Management:    Induction: Intravenous  PONV Risk Score and Plan: 3 and Ondansetron, Dexamethasone and Midazolam  Airway Management Planned: Oral ETT  Additional Equipment: Arterial line, PA Cath, 3D TEE and Ultrasound Guidance Line Placement  Intra-op Plan:   Post-operative Plan: Post-operative intubation/ventilation  Informed Consent: I have reviewed the patients History and Physical, chart, labs and discussed the procedure including the risks, benefits and alternatives for the proposed anesthesia with the patient or authorized representative who has indicated his/her understanding and acceptance.     Dental advisory given  Plan Discussed with: Anesthesiologist, CRNA and Surgeon  Anesthesia Plan Comments:        Anesthesia Quick Evaluation

## 2020-08-22 NOTE — Progress Notes (Signed)
Procedure(s) (LRB): CORONARY ARTERY BYPASS GRAFTING (CABG) (N/A) TRANSESOPHAGEAL ECHOCARDIOGRAM (TEE) (N/A) Subjective: No chest pain or shortness of breath. Ambulated.  Objective: Vital signs in last 24 hours: Temp:  [97.4 F (36.3 C)-98.3 F (36.8 C)] 97.4 F (36.3 C) (03/10 0813) Pulse Rate:  [67-85] 85 (03/10 0813) Cardiac Rhythm: Normal sinus rhythm (03/10 0743) Resp:  [16-18] 16 (03/10 0813) BP: (96-112)/(63-82) 112/82 (03/10 0844) SpO2:  [93 %-99 %] 97 % (03/10 0844) Weight:  [64.8 kg] 64.8 kg (03/10 0228)  Hemodynamic parameters for last 24 hours:    Intake/Output from previous day: 03/09 0701 - 03/10 0700 In: 304 [P.O.:150; I.V.:154] Out: 1325 [Urine:1325] Intake/Output this shift: Total I/O In: 746.8 [P.O.:597; I.V.:149.8] Out: 250 [Urine:250]    Lab Results: Recent Labs    08/21/20 0127 08/22/20 0234  WBC 9.0 8.8  HGB 13.9 14.0  HCT 41.1 42.1  PLT 250 236   BMET:  Recent Labs    08/20/20 0132 08/21/20 0127  NA 138 137  K 4.1 3.8  CL 100 102  CO2 31 27  GLUCOSE 150* 104*  BUN 28* 32*  CREATININE 1.24 1.02  CALCIUM 8.9 9.0    PT/INR: No results for input(s): LABPROT, INR in the last 72 hours. ABG    Component Value Date/Time   PHART 7.432 08/22/2020 1304   HCO3 27.3 08/22/2020 1304   O2SAT 98.8 08/22/2020 1304   CBG (last 3)  No results for input(s): GLUCAP in the last 72 hours.  Assessment/Plan:  Stable for CABG in am. Patient and daughter have no further questions.  LOS: 3 days    Alleen Borne 08/22/2020

## 2020-08-22 NOTE — Discharge Summary (Signed)
Physician Discharge Summary       [address]       [place] [address]             [phone]    Patient ID: Paul [name] MRN: [id number] DOB/AGE: 1940-09-04 80 y.o.  Admit date: 08/19/2020 Discharge date: 08/30/2020  Admission Diagnoses:  CAD (coronary artery disease), native coronary artery Discharge Diagnoses:  1. S/p CABG x 2. Pre diabetes (HGA1C 6.2) 3. History of hypertension 4. History of hyperlipidemia  Consults: None  Procedure (s):  1. Median Sternotomy 2. Extracorporeal circulation 3.   Coronary artery bypass grafting x 3   Left internal mammary artery graft to the OM1  SVG to OM2  SVG to PL (RCA) 4.   Endoscopic vein harvest from both legs by Dr. Laneta Simmers on 08/23/2020.  5. Exploration of mediastinum for bleeding by Dr. Laneta Simmers on 08/24/2020.  History of Presenting Illness: The patient is a 80 year old gentleman from [place], [place] with a history of hypertension and hyperlipidemia who was admitted at [facility] from 08/14/2020 to 08/18/2020 with a 78-month history of progressive exertional shortness of breath and substernal chest discomfort. He began developing chest pressure that radiated to his neck in late December but he ignored the symptoms and thought they would go away. His chest discomfort improved after a week or so but he began developing progressive shortness of breath and LE edema. He saw his PCP and was noted to have sats in the 70's and was admitted to [facility]. He was found to be in acute decompensated heart failure with a BNP of 1255, trop peak of 183. EF was 30-35% by echo. Chest CT was negative for PE but showed pulm edema and bilateral pleural effusions. He was diuresed and had cath on 08/18/20 showing 50% distal LM, 75-80% proximal LAD, mLAD subtotal occlusion, 99% mid LCX with 75% OM1, 80% OM2 and 99% mRCA. He was transferred to [facility] for CABG. He feels much better since being diuresed and denies any symptoms at this time.   His  daughter is with him today. She lives in [place]. He lives alone in [place]. Wife is deceased. 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  Dr. Laneta Simmers discussed the operative procedure with the patient and his daughter including alternatives, benefits and risks. Patient agreed to proceed with surgery. Patient underwent a CABG x ** by Dr. Laneta Simmers on 08/23/2020.  Brief Hospital Course:  Patient continued to have increased output from his chest tubes after surgery. He returned to the OR on 03/12 for re exploration of mediastinum for bleeding. Patient was extubated early post op day one. He was initially AV paced. He was weaned off Epinephrine and Nor epinephrine drips. Theone Murdoch, a line, and foley were removed early in his post operative course. Chest tubes remained a few days then were removed once output decreased. He was volume overloaded and diuresed accordingly. He then went into a fib with RVR the evening of 03/13. He was put on an Amiodarone drip. He was then started on Midodrine for hypotension. He had expected post op blood loss anemia. He was started on Trinsicon. His last H and H was stable at 8.3 and 24.8. He then had complaints of nausea and constipation on 03/15. He was given Zofran PRN, Reglan, and Lactulose. Nausea was resolved on 03/16. He was felt surgically stable for transfer from the ICU to 4E on 03/15. He remained in  sinus rhythm. His sternal and LE wounds are clean, dry, and healing without signs of infection. He has been tolerating a diet and has had a bowel movement. Epicardial pacing wires were removed on 03/16. He did have mild ankle edema so low dose Lasix was started on 03/17 and will be continued post op. Midodrine was titrated down to 5 mg bid on 03/17. Chest tubes sutures will be removed in the office after discharge. Of note, cardiology will arrange for outpatient sleep study. As discussed with Dr. Laneta Simmers, he is felt surgically stable for  discharge today.   Latest Vital Signs: Blood pressure 95/61, pulse 78, temperature 97.9 F (36.6 C), temperature source Oral, resp. rate 16, height 5\' 11"  (1.803 m), weight 62.8 kg, SpO2 96 %.  Physical Exam: Cardiovascular: RRR Pulmonary: Clear to auscultation bilaterally Abdomen: Soft, non tender, bowel sounds present. Extremities: Ankle edema Wounds: Sternal, bilateral LE wounds are all clean and dry.  No erythema or signs of infection.  Discharge Condition:Stable and discharged to daughter's home.  Recent laboratory studies:  Lab Results  Component Value Date   WBC 11.6 (H) 08/27/2020   HGB 8.3 (L) 08/27/2020   HCT 24.8 (L) 08/27/2020   MCV 99.2 08/27/2020   PLT 163 08/27/2020   Lab Results  Component Value Date   NA 134 (L) 08/27/2020   K 4.1 08/27/2020   CL 93 (L) 08/27/2020   CO2 36 (H) 08/27/2020   CREATININE 1.05 08/27/2020   GLUCOSE 133 (H) 08/27/2020     Diagnostic Studies: DG Chest 2 View  Result Date: 08/28/2020 CLINICAL DATA:  Recent coronary artery bypass grafting. EXAM: CHEST - 2 VIEW COMPARISON:  August 27, 2020 FINDINGS: Small left apical pneumothorax is stable without tension component. There is a small pleural effusion on each side. There is mild medial right base atelectasis. There is also mild atelectasis in the right mid lung. No consolidation. No edema. Heart size and pulmonary vascularity are normal. Patient is status post coronary artery bypass grafting. No adenopathy. No bone lesions. IMPRESSION: Small left apical pneumothorax. Small pleural effusions bilaterally. Mild right mid lung and right base atelectasis. No edema or airspace opacity. Stable cardiac silhouette Electronically Signed   By: Bretta Bang III M.D.   On: 08/28/2020 12:12   DG Chest Port 1 View  Result Date: 08/27/2020 CLINICAL DATA:  Chest tube removal EXAM: PORTABLE CHEST 1 VIEW COMPARISON:  August 26, 2020 FINDINGS: Left chest tube has been removed. Cordis remains with tip in  superior vena cava. Stable small left apical pneumothorax without tension component. There is no appreciable edema or airspace opacity. Heart is mildly enlarged with pulmonary vascularity normal, stable. Patient is status post coronary artery bypass grafting. No evident adenopathy. No bone lesions. IMPRESSION: Persistent small left apical pneumothorax without tension component. No edema or airspace opacity. Stable cardiac prominence. Stable Cordis positioning. Electronically Signed   By: Bretta Bang III M.D.   On: 08/27/2020 07:57   DG Chest Port 1 View  Result Date: 08/26/2020 CLINICAL DATA:  Status post CABG. EXAM: PORTABLE CHEST 1 VIEW COMPARISON:  10/25/2020 FINDINGS: Stable heart size and mediastinal contour status post CABG. Bilateral chest tubes remain with stable less than 5% left apical pneumothorax. Definite residual right apical pneumothorax is difficult to visualize. No significant pleural fluid or atelectasis. No pulmonary edema. IMPRESSION: Stable less than 5% left apical pneumothorax. Definite residual right apical pneumothorax is difficult to visualize. Electronically Signed   By: Irish Lack M.D.   On:  08/26/2020 07:50   DG Chest Port 1 View  Result Date: 08/25/2020 CLINICAL DATA:  History of CABG EXAM: PORTABLE CHEST 1 VIEW COMPARISON:  August 24, 2020 FINDINGS: The PA catheter is been removed with a right IJ sheath remaining in good position. The ET and NG tubes have been removed. Chest tubes remain. Small apical pneumothoraces remain, unchanged. The cardiomediastinal silhouette is stable. No other acute abnormalities. IMPRESSION: 1. Support apparatus as above. Tiny apical pneumothoraces remain with chest tubes in place. No other change. Electronically Signed   By: Gerome Sam III M.D   On: 08/25/2020 08:01   DG Chest Port 1 View  Result Date: 08/24/2020 CLINICAL DATA:  Postop CABG, pneumothorax EXAM: PORTABLE CHEST 1 VIEW COMPARISON:  08/23/2020 FINDINGS: Single frontal  view of the chest demonstrates endotracheal tube, enteric catheter, flow directed central venous catheter, and mediastinal drains unchanged. Left chest tube again identified in similar orientation. Right chest tube has been repositioned, tip overlying the lateral costophrenic angle. Cardiac silhouette is unremarkable. No acute airspace disease. Trace biapical pneumothoraces again identified, without significant change. Minimal pleural fluid at the left costophrenic angle. No acute bony abnormalities. IMPRESSION: 1. Stable trace biapical pneumothoraces, with bilateral chest tubes in place. 2. Trace left pleural effusion. 3. Postsurgical changes from CABG. Electronically Signed   By: Sharlet Salina M.D.   On: 08/24/2020 01:47   DG Chest Port 1 View  Result Date: 08/23/2020 CLINICAL DATA:  CABG, pneumothorax. EXAM: PORTABLE CHEST 1 VIEW COMPARISON:  08/19/2020. FINDINGS: Endotracheal tube terminates approximately 5.7 cm above the carina. Nasogastric tube is followed into the stomach with the tip projecting beyond the inferior margin of the image. The side port is a few cm beyond the gastroesophageal junction. Right IJ Swan-Ganz catheter tip is in the proximal right pulmonary artery. Mediastinal drain, bilateral chest tubes and epicardial pacer wires are in place. Heart is enlarged, stable. Small biapical pneumothoraces. Small bilateral pleural effusions. Resolved pulmonary edema and minimal bibasilar atelectasis. IMPRESSION: 1. Small biapical hydro pneumothoraces with bilateral chest tubes in place. 2. Resolved pulmonary edema with improved bibasilar collapse/consolidation. Electronically Signed   By: Leanna Battles M.D.   On: 08/23/2020 14:18   DG CHEST PORT 1 VIEW  Result Date: 08/19/2020 CLINICAL DATA:  Shortness of breath. EXAM: PORTABLE CHEST 1 VIEW COMPARISON:  None. FINDINGS: Normal cardiac silhouette and mediastinal contours. Small to moderate-sized layering bilateral pleural effusion with associated  bibasilar heterogeneous/consolidative opacities. Pulmonary vasculature appears indistinct with cephalization flow. Skin fold overlies the lateral aspect the right mid lung. No definite pneumothorax. No acute osseous abnormalities. Stigmata of dish within the thoracic spine. IMPRESSION: Findings suggestive of pulmonary edema with small to moderate-sized layering bilateral effusions and associated bibasilar opacities, atelectasis versus infiltrate. Electronically Signed   By: Simonne Come M.D.   On: 08/19/2020 08:03   MR CARDIAC MORPHOLOGY W WO CONTRAST  Result Date: 08/19/2020 CLINICAL DATA:  Severe multivessel CAD, evaluate viability EXAM: CARDIAC MRI TECHNIQUE: The patient was scanned on a 1.5 Tesla Siemens magnet. A dedicated cardiac coil was used. Functional imaging was done using Fiesta sequences. 2,3, and 4 chamber views were done to assess for RWMA's. Modified Simpson's rule using a short axis stack was used to calculate an ejection fraction on a dedicated work [employer]. The patient received 7 cc of Gadavist. After 10 minutes inversion recovery sequences were used to assess for infiltration and scar tissue. CONTRAST:  7cc  of Gadavist FINDINGS: Left ventricle: -Moderate dilatation -Severe systolic dysfunction.  Akinesis of mid inferior/inferoseptal/anteroseptal/anterior walls, apical inferior/septal/anterior walls, and apex -LGE: >50% transmural LGE: Mid anteroseptum/inferoseptum, apical anterior/septal/inferior, and apex. <50% transmural LGE: Mid anterior/inferior -RV insertion site LGE LV EF: 28% (Normal 56-78%) Absolute volumes: LV EDV:233 mL (Normal 77-195 mL) LV ESV: (Normal 19-72 mL) LV SV: 30mL (Normal 51-133 mL) CO: 4.6L/min (Normal 2.8-8.8 L/min) Indexed volumes: LV EDV: 122mL/sq-m (Normal 47-92 mL/sq-m) LV ESV: 84mL/sq-m (Normal 13-30 mL/sq-m) LV SV: 79mL/sq-m (Normal 32-62 mL/sq-m) CI: 2.5L/min/sq-m (Normal 1.7-4.2 L/min/sq-m) Right ventricle: Normal size and systolic  function RV EF:  65% (Normal 47-74%) Absolute volumes: RV EDV: (Normal 88-227 mL) RV ESV: 33mL (Normal 23-103 mL) RV SV: 21mL (Normal 52-138 mL) CO: 4.7L/min (Normal 2.8-8.8 L/min) Indexed volumes: RV EDV: 5mL/sq-m (Normal 55-105 mL/sq-m) RV ESV: 25mL/sq-m (Normal 15-43 mL/sq-m) RV SV: 72mL/sq-m (Normal 32-64 mL/sq-m) CI: 2.6L/min/sq-m (Normal 1.7-4.2 L/min/sq-m) Left atrium: Mild enlargement Right atrium: Normal size Mitral valve: Mild regurgitation Aortic valve: Tricuspid.  No regurgitation Tricuspid valve: Mild regurgitation Pulmonic valve: No regurgitation Aorta: Mild dilatation of ascending aorta measuring 64mm Pericardium: Normal Extracardiac structures: Moderate bilateral pleural effusions IMPRESSION: 1. Moderate LV dilatation with severe systolic dysfunction (EF 28%). Akinesis of mid inferior/inferoseptal/anteroseptal/anterior walls, apical inferior/septal/anterior walls, and apex 2. 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 LGE is <50% transmural suggesting viability in the mid anterior/inferior walls. LGE is >50% transmural suggesting nonviability in the mid anteroseptum/inferoseptum, apical anterior/septal/inferior walls, and apex. 3. RV insertion site LGE, which is a nonspecific finding often seen in setting of elevated pulmonary pressures 4.  Moderate bilateral pleural effusions Electronically Signed   By: Epifanio Lesches MD   On: 08/19/2020 22:44   ECHOCARDIOGRAM COMPLETE  Result Date: 08/19/2020    ECHOCARDIOGRAM REPORT   Patient Name:   ASEEM SESSUMS Date of Exam: 08/19/2020 Medical Rec #:  [id number]    Height:       71.0 in Accession #:    [id number]   Weight:       142.8 lb Date of Birth:  02-09-1941     BSA:          1.827 m Patient Age:    79 years     BP:           110/69 mmHg Patient Gender: M            HR:           86 bpm. Exam Location:  Inpatient Procedure: 2D Echo, Color Doppler, Limited Color Doppler and Intracardiac            Opacification  Agent Indications:    I50.9* Heart failure (unspecified)  History:        Patient has no prior history of Echocardiogram examinations.                 Signs/Symptoms:Chest Pain and Shortness of Breath.  Sonographer:    Roosvelt Maser RDCS Referring Phys: [id number] [facility] IMPRESSIONS  1. Left ventricular ejection fraction, by estimation, is 25 to 30%. The left ventricle has severely decreased function.  2. The left ventricle demonstrates regional wall motion abnormalities . All mid-to-apical septal, mid-to-apical anterolateral, mid-to-apical anterior, apical inferior and apex appear akinetic. The rest of the LV walls are hypokinetic.  3. There is mild concentric left ventricular hypertrophy. Left ventricular diastolic parameters are consistent with Grade II diastolic dysfunction (pseudonormalization).  4. Swirling of contrast seen in the left ventricle consistent with low flow state. No LV thrombus visualized.  5. Right ventricular systolic  function is normal. The right ventricular size is normal.  6. The mitral valve is abnormal. Trivial mitral valve regurgitation. No evidence of mitral stenosis. Moderate mitral annular calcification.  7. The aortic valve has an indeterminant number of cusps. There is moderate calcification of the aortic valve. There is moderate thickening of the aortic valve. Aortic valve regurgitation is not visualized. Mild aortic valve stenosis. Gradients low due to low output state.  8. Wall motion concerning for multivessel coronary artery disease. FINDINGS  Left Ventricle: Left ventricular ejection fraction, by estimation, is 25 to 30%. The left ventricle has severely decreased function. The left ventricle demonstrates regional wall motion abnormalities. The mid-to-apical septal, mid-to-apical anterolateral, mid-to-apical anterior, apical inferior and apex appear akinetic. The rest of the LV walls are hypokinetic. Definity contrast agent was given IV to delineate the left ventricular  endocardial borders. The left ventricular internal cavity size was normal in size. There is mild concentric left ventricular hypertrophy. Left ventricular diastolic parameters are consistent with Grade II diastolic dysfunction (pseudonormalization). Right Ventricle: The right ventricular size is normal. No increase in right ventricular wall thickness. Right ventricular systolic function is normal. Left Atrium: Left atrial size was normal in size. Right Atrium: Right atrial size was normal in size. Pericardium: There is no evidence of pericardial effusion. Mitral Valve: The mitral valve is abnormal. There is moderate thickening of the mitral valve leaflet(s). There is moderate calcification of the mitral valve leaflet(s). Moderate mitral annular calcification. Trivial mitral valve regurgitation. No evidence of mitral valve stenosis. Tricuspid Valve: The tricuspid valve is normal in structure. Tricuspid valve regurgitation is trivial. Aortic Valve: The aortic valve has an indeterminant number of cusps. There is moderate calcification of the aortic valve. There is moderate thickening of the aortic valve. Aortic valve regurgitation is not visualized. Mild aortic stenosis is present. Aortic valve mean gradient measures 2.0 mmHg. Aortic valve peak gradient measures 3.2 mmHg. Aortic valve area, by VTI measures 3.09 cm. Pulmonic Valve: The pulmonic valve was not well visualized. Pulmonic valve regurgitation is not visualized. Aorta: The aortic root and ascending aorta are structurally normal, with no evidence of dilitation. IAS/Shunts: No atrial level shunt detected by color flow Doppler.  LEFT VENTRICLE PLAX 2D LVIDd:         4.40 cm      Diastology LVIDs:         3.30 cm      LV e' medial:    4.57 cm/s LV PW:         0.90 cm      LV E/e' medial:  20.9 LV IVS:        0.90 cm      LV e' lateral:   7.18 cm/s LVOT diam:     1.90 cm      LV E/e' lateral: 13.3 LV SV:         52 LV SV Index:   29 LVOT Area:     2.84 cm  LV  Volumes (MOD) LV vol d, MOD A2C: 129.0 ml LV vol d, MOD A4C: 128.0 ml LV vol s, MOD A2C: 97.0 ml LV vol s, MOD A4C: 104.0 ml LV SV MOD A2C:     32.0 ml LV SV MOD A4C:     128.0 ml LV SV MOD BP:      30.2 ml RIGHT VENTRICLE          IVC RV Basal diam:  3.90 cm  IVC diam: 1.90 cm LEFT ATRIUM  Index       RIGHT ATRIUM           Index LA diam:        4.00 cm 2.19 cm/m  RA Area:     14.00 cm LA Vol (A2C):   59.9 ml 32.78 ml/m RA Volume:   38.90 ml  21.29 ml/m LA Vol (A4C):   61.8 ml 33.82 ml/m LA Biplane Vol: 61.8 ml 33.82 ml/m  AORTIC VALVE AV Area (Vmax):    3.02 cm AV Area (Vmean):   2.71 cm AV Area (VTI):     3.09 cm AV Vmax:           89.60 cm/s AV Vmean:          68.800 cm/s AV VTI:            0.169 m AV Peak Grad:      3.2 mmHg AV Mean Grad:      2.0 mmHg LVOT Vmax:         95.30 cm/s LVOT Vmean:        65.700 cm/s LVOT VTI:          0.184 m LVOT/AV VTI ratio: 1.09  AORTA Ao Root diam: 3.40 cm Ao Asc diam:  3.20 cm MITRAL VALVE MV Area (PHT): 4.49 cm    SHUNTS MV Decel Time: 169 msec    Systemic VTI:  0.18 m MV E velocity: 95.50 cm/s  Systemic Diam: 1.90 cm MV A velocity: 75.40 cm/s MV E/A ratio:  1.27 Laurance Flatten MD Electronically signed by Laurance Flatten MD Signature Date/Time: 08/19/2020/3:07:53 PM    Final    ECHO INTRAOPERATIVE TEE  Result Date: 08/23/2020  *INTRAOPERATIVE TRANSESOPHAGEAL REPORT *  Patient Name:   CEPHAS REVARD Date of Exam: 08/23/2020 Medical Rec #:  [id number]    Height:       71.0 in Accession #:    [id number]   Weight:       143.5 lb Date of Birth:  Oct 25, 1940     BSA:          1.83 m Patient Age:    79 years     BP:           98/64 mmHg Patient Gender: M            HR:           67 bpm. Exam Location:  Inpatient Transesophogeal exam was perform intraoperatively during surgical procedure. Patient was closely monitored under general anesthesia during the entirety of examination. Indications:     I25.110 Atherosclerotic heart disease of native coronary artery                   with unstable angina pectoris Performing Phys: [id number] BARTLE Diagnosing Phys: Heather Roberts MD Complications: No known complications during this procedure. POST-OP IMPRESSIONS Overall, there were no significant changes from pre-bypass. PRE-OP FINDINGS  Left Ventricle: The left ventricle has moderate-severely reduced systolic function, with an ejection fraction of 30-35%. The cavity size was moderately dilated. There is no increase in left ventricular wall thickness. Left ventricular diffuse hypokinesis. Moderate akinesis of the left ventricular, entire anterior wall. Severe hypokinesis of the left ventricular, mid-apical anteroseptal wall. There is no left ventricular hypertrophy. Right Ventricle: The right ventricle has normal systolic function. The cavity was normal. There is right vetricular wall thickness was not assessed. Left Atrium: Left atrial size was normal in size. No left atrial/left atrial appendage thrombus was detected. The  left atrial appendage is well visualized and there is no evidence of thrombus present. Right Atrium: Right atrial size was normal in size. Interatrial Septum: No atrial level shunt detected by color flow Doppler. Pericardium: There is no evidence of pericardial effusion. Mitral Valve: The mitral valve is normal in structure. Mitral valve regurgitation is mild by color flow Doppler. The MR jet is centrally-directed. There is No evidence of mitral stenosis. There is moderate thickening and moderate calcification present on  the mitral valve anterior and posterior cusps with mildly decreased mobility. Tricuspid Valve: The tricuspid valve was normal in structure. Tricuspid valve regurgitation is mild by color flow Doppler. Aortic Valve: The aortic valve is tricuspid Aortic valve regurgitation is trivial by color flow Doppler. There is mild stenosis of the aortic valve. Pulmonic Valve: The pulmonic valve was normal in structure. Pulmonic valve regurgitation is mild by  color flow Doppler. Aorta: The aortic arch was not well visualized. There is evidence of plaque in the descending aorta; Grade III, measuring 3-42mm in size.  +------------------+---------++ LV Volumes (MOD)            +------------------+---------++ +------------+---------++ LV area d, A2C:   45.80 cm 3D Volume EF          +------------------+---------++ +------------+---------++ LV area d, A4C:   35.80 cm LV 3D EDV:  139.32 ml +------------------+---------++ +------------+---------++ LV area s, A2C:   37.30 cm +------------------+---------++ LV area s, A4C:   27.50 cm +------------------+---------++ LV major d, A2C:  9.19 cm   +------------------+---------++ LV major d, A4C:  8.22 cm   +------------------+---------++ LV major s, A2C:  8.90 cm   +------------------+---------++ LV major s, A4C:  7.54 cm   +------------------+---------++ LV vol d, MOD A2C:183.0 ml  +------------------+---------++ LV vol d, MOD A4C:127.0 ml  +------------------+---------++ LV vol s, MOD A2C:123.0 ml  +------------------+---------++ LV vol s, MOD A4C:81.1 ml   +------------------+---------++ LV SV MOD A2C:    60.0 ml   +------------------+---------++ LV SV MOD A4C:    127.0 ml  +------------------+---------++ LV SV MOD BP:     53.5 ml   +------------------+---------++ +-------------+-----------++ AORTIC VALVE             +-------------+-----------++ AV Vmax:     105.00 cm/s +-------------+-----------++ AV Vmean:    71.200 cm/s +-------------+-----------++ AV VTI:      0.245 m     +-------------+-----------++ AV Peak Grad:4.4 mmHg    +-------------+-----------++ AV Mean Grad:2.0 mmHg    +-------------+-----------++  Heather Roberts MD Electronically signed by Heather Roberts MD Signature Date/Time: 08/23/2020/1:11:41 PM    Final    VAS US DOPPLER PRE CABG  Result Date: 08/21/2020 PREOPERATIVE VASCULAR EVALUATION  Indications:      Pre-CABG.  Risk Factors:     Hypertension, hyperlipidemia. Comparison Study: no prior Performing Technologist: Blanch Media RVS  Examination Guidelines: A complete evaluation includes B-mode imaging, spectral Doppler, color Doppler, and power Doppler as needed of all accessible portions of each vessel. Bilateral testing is considered an integral part of a complete examination. Limited examinations for reoccurring indications may be performed as noted.  Right Carotid Findings: +----------+--------+--------+--------+------------+--------+           PSV cm/sEDV cm/sStenosisDescribe    Comments +----------+--------+--------+--------+------------+--------+ CCA Prox  59      18              heterogenous         +----------+--------+--------+--------+------------+--------+ CCA Distal40      10  heterogenous         +----------+--------+--------+--------+------------+--------+ ICA Prox  43      17      1-39%   heterogenous         +----------+--------+--------+--------+------------+--------+ ICA Distal51      18                                   +----------+--------+--------+--------+------------+--------+ ECA       96      10                                   +----------+--------+--------+--------+------------+--------+ Portions of this table do not appear on this page. +----------+--------+-------+--------+------------+           PSV cm/sEDV cmsDescribeArm Pressure +----------+--------+-------+--------+------------+ Subclavian62                                  +----------+--------+-------+--------+------------+ +---------+--------+--+--------+-+---------+ VertebralPSV cm/s32EDV cm/s7Antegrade +---------+--------+--+--------+-+---------+ Left Carotid Findings: +----------+--------+--------+--------+------------+--------+           PSV cm/sEDV cm/sStenosisDescribe    Comments +----------+--------+--------+--------+------------+--------+ CCA Prox  60       15              heterogenous         +----------+--------+--------+--------+------------+--------+ CCA Distal53      14              heterogenous         +----------+--------+--------+--------+------------+--------+ ICA Prox  64      22      1-39%   heterogenous         +----------+--------+--------+--------+------------+--------+ ICA Distal42      17                                   +----------+--------+--------+--------+------------+--------+ ECA       156                                          +----------+--------+--------+--------+------------+--------+ +----------+--------+--------+--------+------------+ SubclavianPSV cm/sEDV cm/sDescribeArm Pressure +----------+--------+--------+--------+------------+           82                                   +----------+--------+--------+--------+------------+ +---------+--------+--+--------+--+---------+ VertebralPSV cm/s43EDV cm/s10Antegrade +---------+--------+--+--------+--+---------+  ABI Findings: +--------+------------------+-----+----------+--------+ Right   Rt Pressure (mmHg)IndexWaveform  Comment  +--------+------------------+-----+----------+--------+ Brachial120                    triphasic          +--------+------------------+-----+----------+--------+ PTA     97                0.81 monophasic         +--------+------------------+-----+----------+--------+ DP      111               0.92 monophasic         +--------+------------------+-----+----------+--------+ +--------+------------------+-----+---------+-------+ Left    Lt Pressure (mmHg)IndexWaveform Comment +--------+------------------+-----+---------+-------+ [id number]  triphasic        +--------+------------------+-----+---------+-------+ PTA     146               1.22 triphasic        +--------+------------------+-----+---------+-------+ DP      141               1.18 triphasic         +--------+------------------+-----+---------+-------+ +-------+---------------+----------------+ ABI/TBIToday's ABI/TBIPrevious ABI/TBI +-------+---------------+----------------+ Right  0.92                            +-------+---------------+----------------+ Left   1.22                            +-------+---------------+----------------+  Right Doppler Findings: +--------+--------+-----+---------+--------+ Site    PressureIndexDoppler  Comments +--------+--------+-----+---------+--------+ [id number]          triphasic         +--------+--------+-----+---------+--------+ Radial               triphasic         +--------+--------+-----+---------+--------+ Ulnar                triphasic         +--------+--------+-----+---------+--------+  Left Doppler Findings: +--------+--------+-----+---------+--------+ Site    PressureIndexDoppler  Comments +--------+--------+-----+---------+--------+ [id number]          triphasic         +--------+--------+-----+---------+--------+ Radial               triphasic         +--------+--------+-----+---------+--------+ Ulnar                triphasic         +--------+--------+-----+---------+--------+  Summary: Right Carotid: Velocities in the right ICA are consistent with a 1-39% stenosis. Left Carotid: Velocities in the left ICA are consistent with a 1-39% stenosis. Vertebrals: Bilateral vertebral arteries demonstrate antegrade flow. Right ABI: Resting right ankle-brachial index indicates mild right lower extremity arterial disease. Left ABI: Resting left ankle-brachial index is within normal range. No evidence of significant left lower extremity arterial disease. Right Upper Extremity: Doppler waveforms remain within normal limits with right radial compression. Doppler waveforms remain within normal limits with right ulnar compression. Left Upper Extremity: Doppler waveforms remain within normal limits with left  radial compression. Doppler waveforms remain within normal limits with left ulnar compression.  Electronically signed by Coral Else MD on 08/21/2020 at 10:10:12 PM.    Final      Discharge Medications: Allergies as of 08/30/2020   No Known Allergies     Medication List    STOP taking these medications   amLODipine 5 MG tablet Commonly known as: NORVASC   lisinopril 40 MG tablet Commonly known as: ZESTRIL     TAKE these medications   acetaminophen 325 MG tablet Commonly known as: TYLENOL Take 2 tablets (650 mg total) by mouth every 6 (six) hours as needed for mild pain.   amiodarone 200 MG tablet Commonly known as: PACERONE Take 1 tablet (200 mg total) by mouth 2 (two) times daily. For 2 days then take 200 mg daily thereafter   aspirin 325 MG EC tablet Take 1 tablet (325 mg total) by mouth daily.   atorvastatin 10 MG tablet Commonly known as: LIPITOR Take 10 mg by mouth daily.   ferrous sulfate 325 (65 FE) MG tablet Take 1 tablet (325 mg total) by mouth  daily. For one month then stop. If develops constipation, may take laxative or stop sooner   finasteride 5 MG tablet Commonly known as: PROSCAR Take 5 mg by mouth daily.   furosemide 20 MG tablet Commonly known as: LASIX Take 1 tablet (20 mg total) by mouth daily.   latanoprost 0.005 % ophthalmic solution Commonly known as: XALATAN Place 1 drop into both eyes at bedtime.   midodrine 5 MG tablet Commonly known as: PROAMATINE Take 1 tablet (5 mg total) by mouth 2 (two) times daily with a meal.   multivitamin with minerals Tabs tablet Take 1 tablet by mouth daily.   potassium chloride 10 MEQ tablet Commonly known as: KLOR-CON Take 1 tablet (10 mEq total) by mouth daily.   traMADol 50 MG tablet Commonly known as: ULTRAM Take 1 tablet (50 mg total) by mouth every 4 (four) hours as needed for moderate pain.      The patient has been discharged on:   1.Beta Blocker:  Yes [   ]                               No   [ x  ]                              If No, reason:Labile BP  2.Ace Inhibitor/ARB: Yes [   ]                                     No  [ x   ]                                     If No, reason: Labile BP  3.Statin:   Yes [ x  ]                  No  [   ]                  If No, reason:  4.Ecasa:  Yes  [ x ]                  No   [   ]                  If No, reason:  Follow Up Appointments:  Follow-up Information    Alleen Borne, MD. Go on 09/25/2020.   Specialty: Cardiothoracic Surgery Why: PA/LAT CXR to be taken (at [facility] which is in the same building as Dr. Sharee Pimple office) on 04/13 at 9:00 am;Appointment time is at 9:30 am Contact information: [address] [place] [address] [phone]        [facility] [place]. Go on 09/10/2020.   Specialty: Cardiothoracic Surgery Why: Appointment is with nurse only for chest tube suture removal. Appointment time is at 11:00 am Contact information: [address] [place], [address] [place] [address] [phone]       Medical Doctor.   Why: Obtain a medical doctor for further surveillance of HGA1C 6.2 (pre diabetes)       Azalee Course, [place]. Go on 09/11/2020.   Specialties: Cardiology, Radiology Why: Appointment time is at  2:15 pm Contact information: [address] [place] [address] [phone]               Signed: Lelon HuhDonielle M Union Surgery Center LLCZimmermanPA-C 08/30/2020, 7:32 AM

## 2020-08-22 NOTE — Progress Notes (Signed)
Mobility Specialist Criteria Algorithm Info.   Mobility Team:  HOB elevated: Activity: Ambulated in hall (in chair before and after ambulation) Range of motion: Active; All extremities Level of assistance: Modified independent, requires aide device or extra time Assistive device: None; Other (Comment) (IV Pole) Minutes sitting in chair:  Minutes stood: 5 minutes Minutes ambulated: 5 minutes Distance ambulated (ft): 500 ft Mobility response: Tolerated well Bed Position: Chair  Patient received in recliner chair, agreed to participate in mobility this afternoon. Prior to ambulation completed education on energy conservation and pursed lip breathing. Ambulated in hallway 500 feet mod. Independent with steady gait. Oxygen desaturated briefly to 88-89% requiring pursed lip breathing. No complaints of dizziness or lightheadedness. Tolerated ambulation well without incident or complaint and is now sitting in recliner chair with all needs met.   08/22/2020 3:08 PM

## 2020-08-23 ENCOUNTER — Inpatient Hospital Stay (HOSPITAL_COMMUNITY): Payer: Medicare Other | Admitting: Anesthesiology

## 2020-08-23 ENCOUNTER — Encounter (HOSPITAL_COMMUNITY)
Admission: AD | Disposition: A | Discharge status: Home / Self Care | Payer: Self-pay | Source: Other Acute Inpatient Hospital | Attending: Surgery

## 2020-08-23 ENCOUNTER — Inpatient Hospital Stay (HOSPITAL_COMMUNITY): Payer: Medicare Other

## 2020-08-23 DIAGNOSIS — I2510 Atherosclerotic heart disease of native coronary artery without angina pectoris: Secondary | ICD-10-CM

## 2020-08-23 DIAGNOSIS — E440 Moderate protein-calorie malnutrition: Secondary | ICD-10-CM | POA: Insufficient documentation

## 2020-08-23 DIAGNOSIS — I97611 Postprocedural hemorrhage and hematoma of a circulatory system organ or structure following cardiac bypass: Secondary | ICD-10-CM

## 2020-08-23 DIAGNOSIS — Z951 Presence of aortocoronary bypass graft: Secondary | ICD-10-CM

## 2020-08-23 HISTORY — PX: CHEST EXPLORATION: SHX5104

## 2020-08-23 HISTORY — PX: TEE WITHOUT CARDIOVERSION: SHX5443

## 2020-08-23 HISTORY — PX: CORONARY ARTERY BYPASS GRAFT: SHX141

## 2020-08-23 LAB — BASIC METABOLIC PANEL
Anion gap: 8 (ref 5–15)
Anion gap: 5 (ref 5–15)
BUN: 35 mg/dL — ABNORMAL HIGH (ref 8–23)
BUN: 22 mg/dL (ref 8–23)
CO2: 26 mmol/L (ref 22–32)
CO2: 24 mmol/L (ref 22–32)
Calcium: 9.1 mg/dL (ref 8.9–10.3)
Calcium: 8.5 mg/dL — ABNORMAL LOW (ref 8.9–10.3)
Chloride: 103 mmol/L (ref 98–111)
Chloride: 107 mmol/L (ref 98–111)
Creatinine, Ser: 1.07 mg/dL (ref 0.61–1.24)
Creatinine, Ser: 0.92 mg/dL (ref 0.61–1.24)
GFR, Estimated: >60 mL/min (ref >60)
GFR, Estimated: >60 mL/min (ref >60)
Glucose, Bld: 128 mg/dL — ABNORMAL HIGH (ref 70–99)
Glucose, Bld: 174 mg/dL — ABNORMAL HIGH (ref 70–99)
Potassium: 4.3 mmol/L (ref 3.5–5.1)
Potassium: 4.5 mmol/L (ref 3.5–5.1)
Sodium: 137 mmol/L (ref 135–145)
Sodium: 136 mmol/L (ref 135–145)

## 2020-08-23 LAB — POCT I-STAT, CHEM 8
BUN: 33 mg/dL — ABNORMAL HIGH (ref 8–23)
BUN: 32 mg/dL — ABNORMAL HIGH (ref 8–23)
BUN: 28 mg/dL — ABNORMAL HIGH (ref 8–23)
BUN: 26 mg/dL — ABNORMAL HIGH (ref 8–23)
BUN: 29 mg/dL — ABNORMAL HIGH (ref 8–23)
Calcium, Ion: 1.32 mmol/L (ref 1.15–1.40)
Calcium, Ion: 1.21 mmol/L (ref 1.15–1.40)
Calcium, Ion: 1.1 mmol/L — ABNORMAL LOW (ref 1.15–1.40)
Calcium, Ion: 1.09 mmol/L — ABNORMAL LOW (ref 1.15–1.40)
Calcium, Ion: 1.32 mmol/L (ref 1.15–1.40)
Chloride: 102 mmol/L (ref 98–111)
Chloride: 103 mmol/L (ref 98–111)
Chloride: 100 mmol/L (ref 98–111)
Chloride: 100 mmol/L (ref 98–111)
Chloride: 101 mmol/L (ref 98–111)
Creatinine, Ser: 0.9 mg/dL (ref 0.61–1.24)
Creatinine, Ser: 0.8 mg/dL (ref 0.61–1.24)
Creatinine, Ser: 0.7 mg/dL (ref 0.61–1.24)
Creatinine, Ser: 0.7 mg/dL (ref 0.61–1.24)
Creatinine, Ser: 0.7 mg/dL (ref 0.61–1.24)
Glucose, Bld: 122 mg/dL — ABNORMAL HIGH (ref 70–99)
Glucose, Bld: 111 mg/dL — ABNORMAL HIGH (ref 70–99)
Glucose, Bld: 105 mg/dL — ABNORMAL HIGH (ref 70–99)
Glucose, Bld: 120 mg/dL — ABNORMAL HIGH (ref 70–99)
Glucose, Bld: 136 mg/dL — ABNORMAL HIGH (ref 70–99)
HCT: 40 % (ref 39.0–52.0)
HCT: 36 % — ABNORMAL LOW (ref 39.0–52.0)
HCT: 27 % — ABNORMAL LOW (ref 39.0–52.0)
HCT: 28 % — ABNORMAL LOW (ref 39.0–52.0)
HCT: 28 % — ABNORMAL LOW (ref 39.0–52.0)
Hemoglobin: 13.6 g/dL (ref 13.0–17.0)
Hemoglobin: 12.2 g/dL — ABNORMAL LOW (ref 13.0–17.0)
Hemoglobin: 9.2 g/dL — ABNORMAL LOW (ref 13.0–17.0)
Hemoglobin: 9.5 g/dL — ABNORMAL LOW (ref 13.0–17.0)
Hemoglobin: 9.5 g/dL — ABNORMAL LOW (ref 13.0–17.0)
Potassium: 4.3 mmol/L (ref 3.5–5.1)
Potassium: 4.6 mmol/L (ref 3.5–5.1)
Potassium: 5.2 mmol/L — ABNORMAL HIGH (ref 3.5–5.1)
Potassium: 4.5 mmol/L (ref 3.5–5.1)
Potassium: 5.4 mmol/L — ABNORMAL HIGH (ref 3.5–5.1)
Sodium: 139 mmol/L (ref 135–145)
Sodium: 138 mmol/L (ref 135–145)
Sodium: 136 mmol/L (ref 135–145)
Sodium: 135 mmol/L (ref 135–145)
Sodium: 138 mmol/L (ref 135–145)
TCO2: 29 mmol/L (ref 22–32)
TCO2: 23 mmol/L (ref 22–32)
TCO2: 27 mmol/L (ref 22–32)
TCO2: 28 mmol/L (ref 22–32)
TCO2: 28 mmol/L (ref 22–32)

## 2020-08-23 LAB — CBC
HCT: 42.9 % (ref 39.0–52.0)
HCT: 32.3 % — ABNORMAL LOW (ref 39.0–52.0)
HCT: 24.9 % — ABNORMAL LOW (ref 39.0–52.0)
HCT: 25.2 % — ABNORMAL LOW (ref 39.0–52.0)
Hemoglobin: 14 g/dL (ref 13.0–17.0)
Hemoglobin: 10.5 g/dL — ABNORMAL LOW (ref 13.0–17.0)
Hemoglobin: 7.9 g/dL — ABNORMAL LOW (ref 13.0–17.0)
Hemoglobin: 8.8 g/dL — ABNORMAL LOW (ref 13.0–17.0)
MCH: 31.7 pg (ref 26.0–34.0)
MCH: 32 pg (ref 26.0–34.0)
MCH: 31.7 pg (ref 26.0–34.0)
MCH: 33.2 pg (ref 26.0–34.0)
MCHC: 32.6 g/dL (ref 30.0–36.0)
MCHC: 32.5 g/dL (ref 30.0–36.0)
MCHC: 31.7 g/dL (ref 30.0–36.0)
MCHC: 34.9 g/dL (ref 30.0–36.0)
MCV: 97.3 fL (ref 80.0–100.0)
MCV: 98.5 fL (ref 80.0–100.0)
MCV: 100 fL (ref 80.0–100.0)
MCV: 95.1 fL (ref 80.0–100.0)
Platelets: 251 10*3/uL (ref 150–400)
Platelets: 139 10*3/uL — ABNORMAL LOW (ref 150–400)
Platelets: 143 10*3/uL — ABNORMAL LOW (ref 150–400)
Platelets: 151 10*3/uL (ref 150–400)
RBC: 4.41 MIL/uL (ref 4.22–5.81)
RBC: 3.28 MIL/uL — ABNORMAL LOW (ref 4.22–5.81)
RBC: 2.49 MIL/uL — ABNORMAL LOW (ref 4.22–5.81)
RBC: 2.65 MIL/uL — ABNORMAL LOW (ref 4.22–5.81)
RDW: 13.8 % (ref 11.5–15.5)
RDW: 13.5 % (ref 11.5–15.5)
RDW: 13.5 % (ref 11.5–15.5)
RDW: 14.7 % (ref 11.5–15.5)
WBC: 8.9 10*3/uL (ref 4.0–10.5)
WBC: 13.8 10*3/uL — ABNORMAL HIGH (ref 4.0–10.5)
WBC: 13.9 10*3/uL — ABNORMAL HIGH (ref 4.0–10.5)
WBC: 11.6 10*3/uL — ABNORMAL HIGH (ref 4.0–10.5)
nRBC: 0 % (ref 0.0–0.2)
nRBC: 0 % (ref 0.0–0.2)
nRBC: 0 % (ref 0.0–0.2)
nRBC: 0 % (ref 0.0–0.2)

## 2020-08-23 LAB — POCT I-STAT 7, (LYTES, BLD GAS, ICA,H+H)
Acid-Base Excess: 6 mmol/L — ABNORMAL HIGH (ref 0.0–2.0)
Acid-Base Excess: 4 mmol/L — ABNORMAL HIGH (ref 0.0–2.0)
Acid-Base Excess: 4 mmol/L — ABNORMAL HIGH (ref 0.0–2.0)
Bicarbonate: 29.3 mmol/L — ABNORMAL HIGH (ref 20.0–28.0)
Bicarbonate: 28.2 mmol/L — ABNORMAL HIGH (ref 20.0–28.0)
Bicarbonate: 28.2 mmol/L — ABNORMAL HIGH (ref 20.0–28.0)
Calcium, Ion: 1.03 mmol/L — ABNORMAL LOW (ref 1.15–1.40)
Calcium, Ion: 1.12 mmol/L — ABNORMAL LOW (ref 1.15–1.40)
Calcium, Ion: 1.32 mmol/L (ref 1.15–1.40)
HCT: 27 % — ABNORMAL LOW (ref 39.0–52.0)
HCT: 29 % — ABNORMAL LOW (ref 39.0–52.0)
HCT: 27 % — ABNORMAL LOW (ref 39.0–52.0)
Hemoglobin: 9.2 g/dL — ABNORMAL LOW (ref 13.0–17.0)
Hemoglobin: 9.9 g/dL — ABNORMAL LOW (ref 13.0–17.0)
Hemoglobin: 9.2 g/dL — ABNORMAL LOW (ref 13.0–17.0)
O2 Saturation: 100 %
O2 Saturation: 100 %
O2 Saturation: 100 %
Potassium: 4.3 mmol/L (ref 3.5–5.1)
Potassium: 5 mmol/L (ref 3.5–5.1)
Potassium: 4.5 mmol/L (ref 3.5–5.1)
Sodium: 137 mmol/L (ref 135–145)
Sodium: 136 mmol/L (ref 135–145)
Sodium: 138 mmol/L (ref 135–145)
TCO2: 30 mmol/L (ref 22–32)
TCO2: 29 mmol/L (ref 22–32)
TCO2: 29 mmol/L (ref 22–32)
pCO2 arterial: 36.3 mmHg (ref 32.0–48.0)
pCO2 arterial: 40.7 mmHg (ref 32.0–48.0)
pCO2 arterial: 38.1 mmHg (ref 32.0–48.0)
pH, Arterial: 7.515 — ABNORMAL HIGH (ref 7.350–7.450)
pH, Arterial: 7.449 (ref 7.350–7.450)
pH, Arterial: 7.477 — ABNORMAL HIGH (ref 7.350–7.450)
pO2, Arterial: 408 mmHg — ABNORMAL HIGH (ref 83.0–108.0)
pO2, Arterial: 333 mmHg — ABNORMAL HIGH (ref 83.0–108.0)
pO2, Arterial: 572 mmHg — ABNORMAL HIGH (ref 83.0–108.0)

## 2020-08-23 LAB — ECHO INTRAOPERATIVE TEE
AV Mean grad: 2 mmHg
AV Peak grad: 4.4 mmHg
Ao pk vel: 1.05 m/s
Calc EF: 33.3 %
Height: 71 in
Single Plane A2C EF: 32.8 %
Single Plane A4C EF: 36.1 %
Weight: 2296.31 oz

## 2020-08-23 LAB — POCT I-STAT EG7
Acid-Base Excess: 3 mmol/L — ABNORMAL HIGH (ref 0.0–2.0)
Bicarbonate: 27.1 mmol/L (ref 20.0–28.0)
Calcium, Ion: 1.06 mmol/L — ABNORMAL LOW (ref 1.15–1.40)
HCT: 28 % — ABNORMAL LOW (ref 39.0–52.0)
Hemoglobin: 9.5 g/dL — ABNORMAL LOW (ref 13.0–17.0)
O2 Saturation: 83 %
Potassium: 4.2 mmol/L (ref 3.5–5.1)
Sodium: 138 mmol/L (ref 135–145)
TCO2: 28 mmol/L (ref 22–32)
pCO2, Ven: 38.7 mmHg — ABNORMAL LOW (ref 44.0–60.0)
pH, Ven: 7.454 — ABNORMAL HIGH (ref 7.250–7.430)
pO2, Ven: 45 mmHg (ref 32.0–45.0)

## 2020-08-23 LAB — PROTIME-INR
INR: 1.6 — ABNORMAL HIGH (ref 0.8–1.2)
Prothrombin Time: 18.4 seconds — ABNORMAL HIGH (ref 11.4–15.2)

## 2020-08-23 LAB — HEPARIN LEVEL (UNFRACTIONATED): Heparin Unfractionated: 0.63 IU/mL (ref 0.30–0.70)

## 2020-08-23 LAB — GLUCOSE, CAPILLARY
Glucose-Capillary: 116 mg/dL — ABNORMAL HIGH (ref 70–99)
Glucose-Capillary: 139 mg/dL — ABNORMAL HIGH (ref 70–99)
Glucose-Capillary: 152 mg/dL — ABNORMAL HIGH (ref 70–99)
Glucose-Capillary: 156 mg/dL — ABNORMAL HIGH (ref 70–99)
Glucose-Capillary: 146 mg/dL — ABNORMAL HIGH (ref 70–99)
Glucose-Capillary: 154 mg/dL — ABNORMAL HIGH (ref 70–99)
Glucose-Capillary: 168 mg/dL — ABNORMAL HIGH (ref 70–99)
Glucose-Capillary: 134 mg/dL — ABNORMAL HIGH (ref 70–99)

## 2020-08-23 LAB — HEMOGLOBIN AND HEMATOCRIT, BLOOD
HCT: 29.5 % — ABNORMAL LOW (ref 39.0–52.0)
Hemoglobin: 9.6 g/dL — ABNORMAL LOW (ref 13.0–17.0)

## 2020-08-23 LAB — APTT: aPTT: 38 seconds — ABNORMAL HIGH (ref 24–36)

## 2020-08-23 LAB — PREPARE RBC (CROSSMATCH)

## 2020-08-23 LAB — PLATELET COUNT: Platelets: 160 10*3/uL (ref 150–400)

## 2020-08-23 LAB — MAGNESIUM: Magnesium: 2.5 mg/dL — ABNORMAL HIGH (ref 1.7–2.4)

## 2020-08-23 SURGERY — CORONARY ARTERY BYPASS GRAFTING (CABG)
Anesthesia: General | Site: Chest

## 2020-08-23 SURGERY — EXPLORATION, CHEST
Anesthesia: General

## 2020-08-23 MED ORDER — DOCUSATE SODIUM 100 MG PO CAPS
200.0000 mg | ORAL_CAPSULE | Freq: Every day | ORAL | Status: DC
  Administered 2020-08-24 – 2020-08-27 (×4): 200 mg via ORAL
  Filled 2020-08-23 (×4): qty 2

## 2020-08-23 MED ORDER — PLASMA-LYTE 148 IV SOLN
INTRAVENOUS | Status: DC | PRN
  Administered 2020-08-23: 500 mL

## 2020-08-23 MED ORDER — ALBUMIN HUMAN 5 % IV SOLN: INTRAVENOUS | Status: DC | PRN

## 2020-08-23 MED ORDER — SODIUM CHLORIDE 0.9% FLUSH: 3.0000 mL | INTRAVENOUS | Status: DC | PRN

## 2020-08-23 MED ORDER — FENTANYL CITRATE (PF) 250 MCG/5ML IJ SOLN
INTRAMUSCULAR | Status: AC
  Filled 2020-08-23: qty 5

## 2020-08-23 MED ORDER — SODIUM CHLORIDE 0.9 % IV SOLN: 250.0000 mL | INTRAVENOUS | Status: DC

## 2020-08-23 MED ORDER — MAGNESIUM SULFATE 4 GM/100ML IV SOLN: 4.0000 g | Freq: Once | INTRAVENOUS | Status: AC

## 2020-08-23 MED ORDER — SODIUM CHLORIDE 0.9 % IV SOLN: INTRAVENOUS | Status: DC | PRN

## 2020-08-23 MED ORDER — LACTATED RINGERS IV SOLN: INTRAVENOUS | Status: DC

## 2020-08-23 MED ORDER — METOPROLOL TARTRATE 12.5 MG HALF TABLET
12.5000 mg | ORAL_TABLET | Freq: Two times a day (BID) | ORAL | Status: DC
  Filled 2020-08-23: qty 1

## 2020-08-23 MED ORDER — LACTATED RINGERS IV SOLN: 500.0000 mL | Freq: Once | INTRAVENOUS | Status: DC | PRN

## 2020-08-23 MED ORDER — ALBUMIN HUMAN 5 % IV SOLN
12.5000 g | Freq: Once | INTRAVENOUS | Status: AC
  Administered 2020-08-23: 12.5 g via INTRAVENOUS

## 2020-08-23 MED ORDER — SODIUM CHLORIDE 0.9% IV SOLUTION: Freq: Once | INTRAVENOUS | Status: AC

## 2020-08-23 MED ORDER — MILRINONE LACTATE IN DEXTROSE 20-5 MG/100ML-% IV SOLN: 0.2500 ug/kg/min | INTRAVENOUS | Status: DC

## 2020-08-23 MED ORDER — ASPIRIN EC 325 MG PO TBEC
325.0000 mg | DELAYED_RELEASE_TABLET | Freq: Every day | ORAL | Status: DC
  Administered 2020-08-24 – 2020-08-27 (×4): 325 mg via ORAL
  Filled 2020-08-23 (×4): qty 1

## 2020-08-23 MED ORDER — MIDAZOLAM HCL 5 MG/5ML IJ SOLN
INTRAMUSCULAR | Status: DC | PRN
  Administered 2020-08-23: 3 mg via INTRAVENOUS
  Administered 2020-08-23: 2 mg via INTRAVENOUS
  Administered 2020-08-23 (×2): 1 mg via INTRAVENOUS

## 2020-08-23 MED ORDER — SODIUM CHLORIDE 0.9 % IV SOLN
1.5000 g | Freq: Two times a day (BID) | INTRAVENOUS | Status: AC
  Administered 2020-08-23 – 2020-08-25 (×3): 1.5 g via INTRAVENOUS
  Filled 2020-08-23 (×3): qty 1.5

## 2020-08-23 MED ORDER — EPINEPHRINE HCL 5 MG/250ML IV SOLN IN NS
2.0000 ug/min | INTRAVENOUS | Status: DC
  Administered 2020-08-23: 7 ug/min via INTRAVENOUS
  Administered 2020-08-24: 3 ug/min via INTRAVENOUS
  Administered 2020-08-25: 19:00:00 2 ug/min via INTRAVENOUS
  Filled 2020-08-23 (×2): qty 250

## 2020-08-23 MED ORDER — MORPHINE SULFATE (PF) 2 MG/ML IV SOLN: 1.0000 mg | INTRAVENOUS | Status: DC | PRN

## 2020-08-23 MED ORDER — ACETAMINOPHEN 160 MG/5ML PO SOLN: 1000.0000 mg | Freq: Four times a day (QID) | ORAL | Status: DC

## 2020-08-23 MED ORDER — HEMOSTATIC AGENTS (NO CHARGE) OPTIME
TOPICAL | Status: DC | PRN
  Administered 2020-08-23: 3 via TOPICAL

## 2020-08-23 MED ORDER — NITROGLYCERIN IN D5W 200-5 MCG/ML-% IV SOLN: 0.0000 ug/min | INTRAVENOUS | Status: DC

## 2020-08-23 MED ORDER — METHYLPREDNISOLONE SODIUM SUCC 125 MG IJ SOLR
60.0000 mg | Freq: Once | INTRAMUSCULAR | Status: AC
  Administered 2020-08-23: 60 mg via INTRAVENOUS

## 2020-08-23 MED ORDER — ROCURONIUM BROMIDE 10 MG/ML (PF) SYRINGE
PREFILLED_SYRINGE | INTRAVENOUS | Status: DC | PRN
  Administered 2020-08-23: 100 mg via INTRAVENOUS
  Administered 2020-08-23: 30 mg via INTRAVENOUS
  Administered 2020-08-23: 70 mg via INTRAVENOUS

## 2020-08-23 MED ORDER — SODIUM CHLORIDE 0.9% FLUSH
3.0000 mL | Freq: Two times a day (BID) | INTRAVENOUS | Status: DC
  Administered 2020-08-24 – 2020-08-27 (×7): 3 mL via INTRAVENOUS

## 2020-08-23 MED ORDER — MIDAZOLAM HCL 2 MG/2ML IJ SOLN
INTRAMUSCULAR | Status: AC
  Filled 2020-08-23: qty 2

## 2020-08-23 MED ORDER — CALCIUM CHLORIDE 10 % IV SOLN
INTRAVENOUS | Status: DC | PRN
  Administered 2020-08-23: 200 mg via INTRAVENOUS
  Administered 2020-08-23: 100 mg via INTRAVENOUS

## 2020-08-23 MED ORDER — EPINEPHRINE 1 MG/10ML IJ SOSY
0.1000 mg | PREFILLED_SYRINGE | INTRAMUSCULAR | Status: DC | PRN
  Administered 2020-08-23: 0.1 mg via INTRAVENOUS

## 2020-08-23 MED ORDER — THROMBIN 20000 UNITS EX SOLR
CUTANEOUS | Status: DC | PRN
  Administered 2020-08-23: 20000 [IU] via TOPICAL

## 2020-08-23 MED ORDER — ALBUMIN HUMAN 5 % IV SOLN
250.0000 mL | INTRAVENOUS | Status: AC | PRN
  Administered 2020-08-23 (×4): 12.5 g via INTRAVENOUS
  Filled 2020-08-23 (×3): qty 250

## 2020-08-23 MED ORDER — CHLORHEXIDINE GLUCONATE 0.12 % MT SOLN
15.0000 mL | OROMUCOSAL | Status: AC
  Administered 2020-08-23: 15 mL via OROMUCOSAL

## 2020-08-23 MED ORDER — MAGNESIUM SULFATE 4 GM/100ML IV SOLN
INTRAVENOUS | Status: AC
  Administered 2020-08-23: 4 g via INTRAVENOUS
  Filled 2020-08-23: qty 100

## 2020-08-23 MED ORDER — OXYCODONE HCL 5 MG PO TABS
5.0000 mg | ORAL_TABLET | ORAL | Status: DC | PRN
  Administered 2020-08-24 – 2020-08-26 (×6): 5 mg via ORAL
  Filled 2020-08-23 (×6): qty 1

## 2020-08-23 MED ORDER — DEXTROSE 50 % IV SOLN: 0.0000 mL | INTRAVENOUS | Status: DC | PRN

## 2020-08-23 MED ORDER — METOPROLOL TARTRATE 5 MG/5ML IV SOLN: 2.5000 mg | INTRAVENOUS | Status: DC | PRN

## 2020-08-23 MED ORDER — MIDAZOLAM HCL 2 MG/2ML IJ SOLN
INTRAMUSCULAR | Status: DC | PRN
  Administered 2020-08-23 – 2020-08-24 (×2): 2 mg via INTRAVENOUS

## 2020-08-23 MED ORDER — VANCOMYCIN HCL IN DEXTROSE 1-5 GM/200ML-% IV SOLN
1000.0000 mg | Freq: Once | INTRAVENOUS | Status: AC
  Administered 2020-08-23: 1000 mg via INTRAVENOUS
  Filled 2020-08-23: qty 200

## 2020-08-23 MED ORDER — FENTANYL CITRATE (PF) 250 MCG/5ML IJ SOLN
INTRAMUSCULAR | Status: DC | PRN
  Administered 2020-08-23: 100 ug via INTRAVENOUS
  Administered 2020-08-23: 50 ug via INTRAVENOUS
  Administered 2020-08-23: 100 ug via INTRAVENOUS
  Administered 2020-08-23: 150 ug via INTRAVENOUS
  Administered 2020-08-23: 100 ug via INTRAVENOUS

## 2020-08-23 MED ORDER — MIDAZOLAM HCL 2 MG/2ML IJ SOLN: 2.0000 mg | INTRAMUSCULAR | Status: DC | PRN

## 2020-08-23 MED ORDER — LACTATED RINGERS IV SOLN: INTRAVENOUS | Status: DC | PRN

## 2020-08-23 MED ORDER — LIDOCAINE 2% (20 MG/ML) 5 ML SYRINGE
INTRAMUSCULAR | Status: AC
  Filled 2020-08-23: qty 5

## 2020-08-23 MED ORDER — BISACODYL 10 MG RE SUPP: 10.0000 mg | Freq: Every day | RECTAL | Status: DC

## 2020-08-23 MED ORDER — FAMOTIDINE IN NACL 20-0.9 MG/50ML-% IV SOLN
20.0000 mg | Freq: Two times a day (BID) | INTRAVENOUS | Status: AC
  Administered 2020-08-23: 20 mg via INTRAVENOUS
  Filled 2020-08-23: qty 50

## 2020-08-23 MED ORDER — POTASSIUM CHLORIDE 10 MEQ/50ML IV SOLN
10.0000 meq | INTRAVENOUS | Status: AC
Start: 2020-08-23 — End: 2020-08-23

## 2020-08-23 MED ORDER — METOPROLOL TARTRATE 25 MG/10 ML ORAL SUSPENSION
12.5000 mg | Freq: Two times a day (BID) | ORAL | Status: DC
  Filled 2020-08-23: qty 5

## 2020-08-23 MED ORDER — PROPOFOL 10 MG/ML IV BOLUS
INTRAVENOUS | Status: AC
  Filled 2020-08-23: qty 20

## 2020-08-23 MED ORDER — FAMOTIDINE IN NACL 20-0.9 MG/50ML-% IV SOLN
INTRAVENOUS | Status: AC
  Administered 2020-08-23: 20 mg via INTRAVENOUS
  Filled 2020-08-23: qty 50

## 2020-08-23 MED ORDER — ACETAMINOPHEN 650 MG RE SUPP
650.0000 mg | Freq: Once | RECTAL | Status: AC
  Administered 2020-08-23: 650 mg via RECTAL

## 2020-08-23 MED ORDER — LATANOPROST 0.005 % OP SOLN
1.0000 [drp] | Freq: Every day | OPHTHALMIC | Status: DC
  Administered 2020-08-24 – 2020-08-29 (×6): 1 [drp] via OPHTHALMIC
  Filled 2020-08-23 (×3): qty 2.5

## 2020-08-23 MED ORDER — TRAMADOL HCL 50 MG PO TABS
50.0000 mg | ORAL_TABLET | ORAL | Status: DC | PRN
  Administered 2020-08-24 – 2020-08-26 (×5): 50 mg via ORAL
  Filled 2020-08-23 (×5): qty 1

## 2020-08-23 MED ORDER — ROCURONIUM BROMIDE 10 MG/ML (PF) SYRINGE
PREFILLED_SYRINGE | INTRAVENOUS | Status: AC
  Filled 2020-08-23: qty 20

## 2020-08-23 MED ORDER — HEMOSTATIC AGENTS (NO CHARGE) OPTIME
TOPICAL | Status: DC | PRN
  Administered 2020-08-23: 1 via TOPICAL

## 2020-08-23 MED ORDER — ACETAMINOPHEN 500 MG PO TABS
1000.0000 mg | ORAL_TABLET | Freq: Four times a day (QID) | ORAL | Status: DC
  Administered 2020-08-24 – 2020-08-27 (×10): 1000 mg via ORAL
  Filled 2020-08-23 (×11): qty 2

## 2020-08-23 MED ORDER — SODIUM CHLORIDE 0.45 % IV SOLN: INTRAVENOUS | Status: DC | PRN

## 2020-08-23 MED ORDER — PROPOFOL 10 MG/ML IV BOLUS
INTRAVENOUS | Status: DC | PRN
  Administered 2020-08-23: 50 mg via INTRAVENOUS

## 2020-08-23 MED ORDER — ASPIRIN 81 MG PO CHEW: 324.0000 mg | CHEWABLE_TABLET | Freq: Every day | ORAL | Status: DC

## 2020-08-23 MED ORDER — HEPARIN SODIUM (PORCINE) 1000 UNIT/ML IJ SOLN
INTRAMUSCULAR | Status: DC | PRN
  Administered 2020-08-23: 24000 [IU] via INTRAVENOUS

## 2020-08-23 MED ORDER — ACETAMINOPHEN 500 MG PO TABS: 1000.0000 mg | ORAL_TABLET | Freq: Once | ORAL | Status: DC

## 2020-08-23 MED ORDER — 0.9 % SODIUM CHLORIDE (POUR BTL) OPTIME
TOPICAL | Status: DC | PRN
  Administered 2020-08-23: 4000 mL

## 2020-08-23 MED ORDER — NOREPINEPHRINE 4 MG/250ML-% IV SOLN
0.0000 ug/min | INTRAVENOUS | Status: DC
  Administered 2020-08-23: 3 ug/min via INTRAVENOUS
  Administered 2020-08-24: 4 ug/min via INTRAVENOUS
  Filled 2020-08-23: qty 250

## 2020-08-23 MED ORDER — ROCURONIUM 10MG/ML (10ML) SYRINGE FOR MEDFUSION PUMP - OPTIME
INTRAVENOUS | Status: DC | PRN
  Administered 2020-08-23: 50 mg via INTRAVENOUS

## 2020-08-23 MED ORDER — PHENYLEPHRINE 40 MCG/ML (10ML) SYRINGE FOR IV PUSH (FOR BLOOD PRESSURE SUPPORT)
PREFILLED_SYRINGE | INTRAVENOUS | Status: DC | PRN
  Administered 2020-08-23 (×4): 40 ug via INTRAVENOUS

## 2020-08-23 MED ORDER — PROTAMINE SULFATE 10 MG/ML IV SOLN
INTRAVENOUS | Status: DC | PRN
  Administered 2020-08-23 (×4): 50 mg via INTRAVENOUS
  Administered 2020-08-23: 10 mg via INTRAVENOUS

## 2020-08-23 MED ORDER — ACETAMINOPHEN 160 MG/5ML PO SOLN: 650.0000 mg | Freq: Once | ORAL | Status: AC

## 2020-08-23 MED ORDER — SODIUM CHLORIDE 0.9% IV SOLUTION: Freq: Once | INTRAVENOUS | Status: DC

## 2020-08-23 MED ORDER — METHYLPREDNISOLONE SODIUM SUCC 125 MG IJ SOLR: 60.0000 mg | Freq: Once | INTRAMUSCULAR | Status: AC

## 2020-08-23 MED ORDER — PANTOPRAZOLE SODIUM 40 MG PO TBEC
40.0000 mg | DELAYED_RELEASE_TABLET | Freq: Every day | ORAL | Status: DC
  Administered 2020-08-25 – 2020-08-27 (×3): 40 mg via ORAL
  Filled 2020-08-23 (×3): qty 1

## 2020-08-23 MED ORDER — MIDAZOLAM HCL (PF) 10 MG/2ML IJ SOLN
INTRAMUSCULAR | Status: AC
  Filled 2020-08-23: qty 2

## 2020-08-23 MED ORDER — INSULIN REGULAR(HUMAN) IN NACL 100-0.9 UT/100ML-% IV SOLN: INTRAVENOUS | Status: DC

## 2020-08-23 MED ORDER — ONDANSETRON HCL 4 MG/2ML IJ SOLN
4.0000 mg | Freq: Four times a day (QID) | INTRAMUSCULAR | Status: DC | PRN
  Administered 2020-08-24 – 2020-08-27 (×5): 4 mg via INTRAVENOUS
  Filled 2020-08-23 (×5): qty 2

## 2020-08-23 MED ORDER — ATORVASTATIN CALCIUM 10 MG PO TABS
10.0000 mg | ORAL_TABLET | Freq: Every day | ORAL | Status: DC
  Administered 2020-08-24 – 2020-08-30 (×7): 10 mg via ORAL
  Filled 2020-08-23 (×7): qty 1

## 2020-08-23 MED ORDER — 0.9 % SODIUM CHLORIDE (POUR BTL) OPTIME
TOPICAL | Status: DC | PRN
  Administered 2020-08-23: 5000 mL

## 2020-08-23 MED ORDER — PHENYLEPHRINE HCL-NACL 20-0.9 MG/250ML-% IV SOLN: 0.0000 ug/min | INTRAVENOUS | Status: DC

## 2020-08-23 MED ORDER — BISACODYL 5 MG PO TBEC
10.0000 mg | DELAYED_RELEASE_TABLET | Freq: Every day | ORAL | Status: DC
  Filled 2020-08-23: qty 2

## 2020-08-23 MED ORDER — CHLORHEXIDINE GLUCONATE CLOTH 2 % EX PADS
6.0000 | MEDICATED_PAD | Freq: Every day | CUTANEOUS | Status: DC
  Administered 2020-08-23 – 2020-08-27 (×4): 6 via TOPICAL

## 2020-08-23 MED ORDER — FENTANYL CITRATE (PF) 250 MCG/5ML IJ SOLN
INTRAMUSCULAR | Status: DC | PRN
Start: 2020-08-23 — End: 2020-08-24
  Administered 2020-08-23: 150 ug via INTRAVENOUS
  Administered 2020-08-23: 100 ug via INTRAVENOUS

## 2020-08-23 MED ORDER — CALCIUM CHLORIDE 10 % IV SOLN
0.5000 g | Freq: Once | INTRAVENOUS | Status: AC
  Administered 2020-08-23: 0.5 g via INTRAVENOUS

## 2020-08-23 MED ORDER — EPINEPHRINE PF 1 MG/ML IJ SOLN
INTRAVENOUS | Status: DC | PRN
  Administered 2020-08-23: 2 ug/min via INTRAVENOUS

## 2020-08-23 MED ORDER — THROMBIN (RECOMBINANT) 20000 UNITS EX SOLR
CUTANEOUS | Status: AC
  Filled 2020-08-23: qty 20000

## 2020-08-23 MED ORDER — SODIUM CHLORIDE 0.9 % IV SOLN: INTRAVENOUS | Status: DC

## 2020-08-23 MED ORDER — STERILE WATER FOR IRRIGATION IR SOLN
Status: DC | PRN
  Administered 2020-08-23: 2000 mL

## 2020-08-23 MED ORDER — DEXMEDETOMIDINE HCL IN NACL 400 MCG/100ML IV SOLN
0.0000 ug/kg/h | INTRAVENOUS | Status: DC
  Administered 2020-08-23: 0.6 ug/kg/h via INTRAVENOUS
  Administered 2020-08-24: 0.7 ug/kg/h via INTRAVENOUS
  Filled 2020-08-23 (×2): qty 100

## 2020-08-23 SURGICAL SUPPLY — 112 items
ADH SKN CLS APL DERMABOND .7 (GAUZE/BANDAGES/DRESSINGS) ×2
BAG DECANTER FOR FLEXI CONT (MISCELLANEOUS) ×3 IMPLANT
BLADE CLIPPER SURG (BLADE) ×6 IMPLANT
BLADE STERNUM SYSTEM 6 (BLADE) ×3 IMPLANT
BNDG CMPR MED 10X6 ELC LF (GAUZE/BANDAGES/DRESSINGS) ×2
BNDG CMPR MED 15X6 ELC VLCR LF (GAUZE/BANDAGES/DRESSINGS) ×2
BNDG ELASTIC 4X5.8 VLCR STR LF (GAUZE/BANDAGES/DRESSINGS) ×6 IMPLANT
BNDG ELASTIC 6X10 VLCR STRL LF (GAUZE/BANDAGES/DRESSINGS) ×3 IMPLANT
BNDG ELASTIC 6X15 VLCR STRL LF (GAUZE/BANDAGES/DRESSINGS) ×3 IMPLANT
BNDG ELASTIC 6X5.8 VLCR STR LF (GAUZE/BANDAGES/DRESSINGS) ×3 IMPLANT
BNDG GAUZE ELAST 4 BULKY (GAUZE/BANDAGES/DRESSINGS) ×6 IMPLANT
CANISTER SUCT 3000ML PPV (MISCELLANEOUS) ×3 IMPLANT
CATH ROBINSON RED A/P 18FR (CATHETERS) ×6 IMPLANT
CATH THORACIC 28FR (CATHETERS) ×6 IMPLANT
CATH THORACIC 36FR (CATHETERS) ×3 IMPLANT
CATH THORACIC 36FR RT ANG (CATHETERS) ×3 IMPLANT
CLIP VESOCCLUDE MED 24/CT (CLIP) IMPLANT
CLIP VESOCCLUDE SM WIDE 24/CT (CLIP) IMPLANT
CONN Y 3/8X3/8X3/8  BEN (MISCELLANEOUS) ×1
CONN Y 3/8X3/8X3/8 BEN (MISCELLANEOUS) ×2 IMPLANT
DERMABOND ADVANCED (GAUZE/BANDAGES/DRESSINGS) ×1
DERMABOND ADVANCED .7 DNX12 (GAUZE/BANDAGES/DRESSINGS) ×2 IMPLANT
DRAPE CARDIOVASCULAR INCISE (DRAPES) ×3
DRAPE SLUSH/WARMER DISC (DRAPES) ×3 IMPLANT
DRAPE SRG 135X102X78XABS (DRAPES) ×2 IMPLANT
DRSG COVADERM 4X14 (GAUZE/BANDAGES/DRESSINGS) ×3 IMPLANT
ELECT CAUTERY BLADE 6.4 (BLADE) ×3 IMPLANT
ELECT KIT SAFEOP EMG/NMJ (KITS)
ELECT REM PT RETURN 9FT ADLT (ELECTROSURGICAL) ×6
ELECTRODE REM PT RTRN 9FT ADLT (ELECTROSURGICAL) ×4 IMPLANT
FELT TEFLON 1X6 (MISCELLANEOUS) ×3 IMPLANT
GAUZE SPONGE 4X4 12PLY STRL (GAUZE/BANDAGES/DRESSINGS) ×6 IMPLANT
GAUZE SPONGE 4X4 12PLY STRL LF (GAUZE/BANDAGES/DRESSINGS) ×6 IMPLANT
GLOVE BIO SURGEON STRL SZ 6 (GLOVE) ×6 IMPLANT
GLOVE BIO SURGEON STRL SZ 6.5 (GLOVE) ×6 IMPLANT
GLOVE BIO SURGEON STRL SZ7 (GLOVE) ×3 IMPLANT
GLOVE BIO SURGEON STRL SZ7.5 (GLOVE) ×3 IMPLANT
GLOVE BIOGEL PI IND STRL 6 (GLOVE) IMPLANT
GLOVE BIOGEL PI INDICATOR 6 (GLOVE)
GLOVE ORTHO TXT STRL SZ7.5 (GLOVE) IMPLANT
GLOVE SURG LTX SZ6.5 (GLOVE) ×3 IMPLANT
GLOVE SURG POLYISO LF SZ6.5 (GLOVE) ×9 IMPLANT
GLOVE SURG UNDER POLY LF SZ6.5 (GLOVE) ×3 IMPLANT
GLOVE SURG UNDER POLY LF SZ7 (GLOVE) IMPLANT
GLOVE TRIUMPH SURG SIZE 7.0 (KITS) ×12 IMPLANT
GOWN STRL REIN XL LVL4 (GOWNS) ×9 IMPLANT
GOWN STRL REUS W/ TWL LRG LVL3 (GOWN DISPOSABLE) ×12 IMPLANT
GOWN STRL REUS W/ TWL XL LVL3 (GOWN DISPOSABLE) ×2 IMPLANT
GOWN STRL REUS W/TWL LRG LVL3 (GOWN DISPOSABLE) ×18
GOWN STRL REUS W/TWL XL LVL3 (GOWN DISPOSABLE) ×3
HEMOCLIP SM (CLIP) ×3 IMPLANT
HEMOSTAT POWDER SURGIFOAM 1G (HEMOSTASIS) ×9 IMPLANT
HEMOSTAT SURGICEL 2X14 (HEMOSTASIS) ×3 IMPLANT
INSERT FOGARTY 61MM (MISCELLANEOUS) IMPLANT
INSERT FOGARTY XLG (MISCELLANEOUS) IMPLANT
KIT BASIN OR (CUSTOM PROCEDURE TRAY) ×3 IMPLANT
KIT CATH CPB BARTLE (MISCELLANEOUS) ×3 IMPLANT
KIT EMG SRFC ELECT (KITS) IMPLANT
KIT SUCTION CATH 14FR (SUCTIONS) ×3 IMPLANT
KIT TURNOVER KIT B (KITS) ×3 IMPLANT
KIT VASOVIEW HEMOPRO 2 VH 4000 (KITS) ×3 IMPLANT
NS IRRIG 1000ML POUR BTL (IV SOLUTION) ×15 IMPLANT
PACK E OPEN HEART (SUTURE) ×3 IMPLANT
PACK OPEN HEART (CUSTOM PROCEDURE TRAY) ×3 IMPLANT
PAD ARMBOARD 7.5X6 YLW CONV (MISCELLANEOUS) ×6 IMPLANT
PAD ELECT DEFIB RADIOL ZOLL (MISCELLANEOUS) ×3 IMPLANT
PENCIL BUTTON HOLSTER BLD 10FT (ELECTRODE) ×3 IMPLANT
POSITIONER HEAD DONUT 9IN (MISCELLANEOUS) ×3 IMPLANT
PUNCH AORTIC ROTATE 4.0MM (MISCELLANEOUS) IMPLANT
PUNCH AORTIC ROTATE 4.5MM 8IN (MISCELLANEOUS) ×3 IMPLANT
PUNCH AORTIC ROTATE 5MM 8IN (MISCELLANEOUS) IMPLANT
SET CARDIOPLEGIA MPS 5001102 (MISCELLANEOUS) ×3 IMPLANT
SPONGE INTESTINAL PEANUT (DISPOSABLE) IMPLANT
SPONGE LAP 18X18 RF (DISPOSABLE) ×6 IMPLANT
SPONGE LAP 4X18 RFD (DISPOSABLE) ×6 IMPLANT
SUPPORT HEART JANKE-BARRON (MISCELLANEOUS) ×3 IMPLANT
SUT BONE WAX W31G (SUTURE) ×3 IMPLANT
SUT MNCRL AB 4-0 PS2 18 (SUTURE) ×6 IMPLANT
SUT PROLENE 3 0 SH DA (SUTURE) ×3 IMPLANT
SUT PROLENE 3 0 SH1 36 (SUTURE) ×6 IMPLANT
SUT PROLENE 4 0 RB 1 (SUTURE)
SUT PROLENE 4 0 SH DA (SUTURE) IMPLANT
SUT PROLENE 4-0 RB1 .5 CRCL 36 (SUTURE) IMPLANT
SUT PROLENE 5 0 C 1 36 (SUTURE) IMPLANT
SUT PROLENE 6 0 C 1 30 (SUTURE) IMPLANT
SUT PROLENE 7 0 BV 1 (SUTURE) IMPLANT
SUT PROLENE 7 0 BV1 MDA (SUTURE) ×6 IMPLANT
SUT PROLENE 8 0 BV175 6 (SUTURE) IMPLANT
SUT SILK  1 MH (SUTURE) ×1
SUT SILK 1 MH (SUTURE) ×2 IMPLANT
SUT SILK 2 0 SH (SUTURE) IMPLANT
SUT STEEL 6MS V (SUTURE) ×6 IMPLANT
SUT STEEL STERNAL CCS#1 18IN (SUTURE) IMPLANT
SUT STEEL SZ 6 DBL 3X14 BALL (SUTURE) IMPLANT
SUT VIC AB 1 CTX 36 (SUTURE) ×9
SUT VIC AB 1 CTX36XBRD ANBCTR (SUTURE) ×6 IMPLANT
SUT VIC AB 2-0 CT1 27 (SUTURE) ×6
SUT VIC AB 2-0 CT1 TAPERPNT 27 (SUTURE) ×4 IMPLANT
SUT VIC AB 2-0 CTX 27 (SUTURE) IMPLANT
SUT VIC AB 3-0 SH 27 (SUTURE)
SUT VIC AB 3-0 SH 27X BRD (SUTURE) IMPLANT
SUT VIC AB 3-0 X1 27 (SUTURE) IMPLANT
SUT VICRYL 4-0 PS2 18IN ABS (SUTURE) IMPLANT
SYSTEM SAHARA CHEST DRAIN ATS (WOUND CARE) ×3 IMPLANT
TAPE PAPER 2X10 WHT MICROPORE (GAUZE/BANDAGES/DRESSINGS) ×3 IMPLANT
TAPE PAPER 3X10 WHT MICROPORE (GAUZE/BANDAGES/DRESSINGS) ×3 IMPLANT
TOWEL GREEN STERILE (TOWEL DISPOSABLE) ×3 IMPLANT
TOWEL GREEN STERILE FF (TOWEL DISPOSABLE) ×3 IMPLANT
TRAY FOLEY SLVR 16FR TEMP STAT (SET/KITS/TRAYS/PACK) ×3 IMPLANT
TUBING LAP HI FLOW INSUFFLATIO (TUBING) ×3 IMPLANT
UNDERPAD 30X36 HEAVY ABSORB (UNDERPADS AND DIAPERS) ×3 IMPLANT
WATER STERILE IRR 1000ML POUR (IV SOLUTION) ×6 IMPLANT

## 2020-08-23 SURGICAL SUPPLY — 34 items
BLADE SURG 10 STRL SS (BLADE) ×6 IMPLANT
CANISTER SUCT 3000ML PPV (MISCELLANEOUS) ×3 IMPLANT
DRAPE LAPAROSCOPIC ABDOMINAL (DRAPES) ×3 IMPLANT
DRSG COVADERM 4X14 (GAUZE/BANDAGES/DRESSINGS) ×3 IMPLANT
ELECT CAUTERY BLADE 6.4 (BLADE) ×3 IMPLANT
ELECT REM PT RETURN 9FT ADLT (ELECTROSURGICAL) ×3
ELECTRODE REM PT RTRN 9FT ADLT (ELECTROSURGICAL) ×1 IMPLANT
GAUZE SPONGE 4X4 12PLY STRL LF (GAUZE/BANDAGES/DRESSINGS) ×3 IMPLANT
GLOVE TRIUMPH SURG SIZE 7.0 (KITS) ×3 IMPLANT
GOWN STRL REUS W/ TWL LRG LVL3 (GOWN DISPOSABLE) ×3 IMPLANT
GOWN STRL REUS W/ TWL XL LVL3 (GOWN DISPOSABLE) ×1 IMPLANT
GOWN STRL REUS W/TWL LRG LVL3 (GOWN DISPOSABLE) ×9
GOWN STRL REUS W/TWL XL LVL3 (GOWN DISPOSABLE) ×3
KIT BASIN OR (CUSTOM PROCEDURE TRAY) ×3 IMPLANT
KIT SUCTION CATH 14FR (SUCTIONS) ×3 IMPLANT
KIT TURNOVER KIT B (KITS) ×3 IMPLANT
NS IRRIG 1000ML POUR BTL (IV SOLUTION) ×3 IMPLANT
PACK OPEN HEART (CUSTOM PROCEDURE TRAY) ×3 IMPLANT
SUT BONE WAX W31G (SUTURE) ×3 IMPLANT
SUT PROLENE 3 0 SH DA (SUTURE) ×6 IMPLANT
SUT PROLENE 4 0 RB 1 (SUTURE) ×3
SUT PROLENE 4-0 RB1 .5 CRCL 36 (SUTURE) ×1 IMPLANT
SUT SILK 1 TIES 10X30 (SUTURE) ×3 IMPLANT
SUT SILK 2 0SH CR/8 30 (SUTURE) ×3 IMPLANT
SUT STEEL 6MS V (SUTURE) ×6 IMPLANT
SUT VIC AB 1 CTX 36 (SUTURE) ×9
SUT VIC AB 1 CTX36XBRD ANBCTR (SUTURE) ×3 IMPLANT
SUT VIC AB 2-0 CTX 36 (SUTURE) ×6 IMPLANT
TAPE CLOTH SURG 4X10 WHT LF (GAUZE/BANDAGES/DRESSINGS) ×3 IMPLANT
TAPE PAPER 2X10 WHT MICROPORE (GAUZE/BANDAGES/DRESSINGS) ×3 IMPLANT
TOWEL GREEN STERILE (TOWEL DISPOSABLE) ×3 IMPLANT
TUBE CONNECTING 20'X1/4 (TUBING) ×1
TUBE CONNECTING 20X1/4 (TUBING) ×2 IMPLANT
WATER STERILE IRR 1000ML POUR (IV SOLUTION) ×3 IMPLANT

## 2020-08-23 NOTE — Op Note (Signed)
CARDIOVASCULAR SURGERY OPERATIVE NOTE  08/23/2020  Surgeon:  Alleen Borne, MD  First Assistant: Doree Fudge, PA-C   Preoperative Diagnosis:  Severe multi-vessel coronary artery disease   Postoperative Diagnosis:  Same   Procedure:  1. Median Sternotomy 2. Extracorporeal circulation 3.   Coronary artery bypass grafting x 3   Left internal mammary artery graft to the OM1  SVG to OM2  SVG to PL (RCA) 4.   Endoscopic vein harvest from both legs   Anesthesia:  General Endotracheal   Clinical History/Surgical Indication:  The patient is a 80 year old gentleman from [employer], [place] with a history of hypertension and hyperlipidemia who was admitted at [facility] from 08/14/2020 to 08/18/2020 with a 36-month history of progressive exertional shortness of breath and substernal chest discomfort. He began developing chest pressure that radiated to his neck in late December but he ignored the symptoms and thought they would go away. His chest discomfort improved after a week or so but he began developing progressive shortness of breath and LE edema. He saw his PCP and was noted to have sats in the 70's and was admitted to [facility]. He was found to be in acute decompensated heart failure with a BNP of 1255, trop peak of 183. EF was 30-35% by echo. Chest CT was negative for PE but showed pulm edema and bilateral pleural effusions. He was diuresed and had cath on 08/18/20 showing 50% distal LM, 75-80% proximal LAD, mLAD subtotal occlusion, 99% mid LCX with 75% OM1, 80% OM2 and 99% mRCA. He was transferred to [facility] for CABG. Cardiac MRI shows an ejection fraction of 28% with viability in the mid anterior and inferior walls but significant areas of nonviability in the mid anterior septum/inferior septum, apical anterior/septal/inferior walls and apex.  His entire lateral wall is at high risk given the left  circumflex stenoses and hopefully his anterior and inferior walls will improve with revascularization.  I discussed the operative procedure with the patient and his daughter including alternatives, benefits and risks; including but not limited to bleeding, blood transfusion, infection, stroke, myocardial infarction, graft failure, heart block requiring a permanent pacemaker, organ dysfunction, and death.  Judd Gaudier understands and agrees to proceed.  We will schedule surgery for Friday am. In the mean time he should be up walking to maintain mobility.  Preparation:  The patient was seen in the preoperative holding area and the correct patient, correct operation were confirmed with the patient after reviewing the medical record and catheterization. The consent was signed by me. Preoperative antibiotics were given. A pulmonary arterial line and radial arterial line were placed by the anesthesia team. The patient was taken back to the operating room and positioned supine on the operating room table. After being placed under general endotracheal anesthesia by the anesthesia team a foley catheter was placed. The neck, chest, abdomen, and both legs were prepped with betadine soap and solution and draped in the usual sterile manner. A surgical time-out was taken and the correct patient and operative procedure were confirmed with the nursing and anesthesia staff.   Cardiopulmonary Bypass:  A median sternotomy was performed. The pericardium was opened in the midline. Right ventricular function appeared normal. The ascending aorta was of normal size and had no palpable plaque. There were no contraindications to aortic cannulation or cross-clamping. The patient was fully systemically heparinized and the ACT was maintained > 400 sec. The proximal aortic arch was cannulated with a 20 F aortic cannula for arterial inflow. Venous  cannulation was performed via the right atrial appendage using a two-staged venous  cannula. An antegrade cardioplegia/vent cannula was inserted into the mid-ascending aorta. Aortic occlusion was performed with a single cross-clamp. Systemic cooling to 32 degrees Centigrade and topical cooling of the heart with iced saline were used. Hyperkalemic antegrade cold blood cardioplegia was used to induce diastolic arrest and was then given at about 20 minute intervals throughout the period of arrest to maintain myocardial temperature at or below 10 degrees centigrade. A temperature probe was inserted into the interventricular septum and an insulating pad was placed in the pericardium.   Left internal mammary artery harvest:  The left side of the sternum was retracted using the Rultract retractor. The left internal mammary artery was harvested as a pedicle graft. All side branches were clipped. It was a medium-sized vessel of good quality with excellent blood flow. It was ligated distally and divided. It was sprayed with topical papaverine solution to prevent vasospasm.   Endoscopic vein harvest:  The right greater saphenous vein was harvested endoscopically through a 2 cm incision medial to the right knee. It was harvested from the upper thigh to below the knee. It was a medium-sized vein of good quality. Below the knee it bifurcated into two small branches. Therefore a second segment of GSV was harvested from left thigh. It was a larger vein with somewhat thickened walls but satisfactory. The side branches were all ligated with 4-0 silk ties.    Coronary arteries:  The coronary arteries were examined.   LAD:  Diffusely diseased with calcific plaque that extended to the apex. It was not graftable. The distal anterior wall and apex were thinned out scar.  LCX:  OM1 large, intramyocardial. OM2 small but graftable.  RCA:  Diffusely diseased. PDA was tiny and the PL was small but graftable.   Grafts:  1. LIMA to the OM1: 2.5 mm. It was sewn end to side using 8-0 prolene continuous  suture. 2. SVG to OM2:  1.5 mm. It was sewn end to side using 7-0 prolene continuous suture. 3. SVG to PL (RCA):  1.6 mm. It was sewn end to side using 7-0 prolene continuous suture.   The proximal vein graft anastomoses were performed to the mid-ascending aorta using continuous 6-0 prolene suture. Graft markers were placed around the proximal anastomoses.   Completion:  The patient was rewarmed to 37 degrees Centigrade. The clamp was removed from the LIMA pedicle and there was rapid warming of the septum and return of ventricular fibrillation. The crossclamp was removed with a time of 87 minutes. There was spontaneous return of sinus rhythm. The distal and proximal anastomoses were checked for hemostasis. The position of the grafts was satisfactory. Two temporary epicardial pacing wires were placed on the right atrium and two on the right ventricle. The patient was weaned from CPB without difficulty on milrinone 0.25. TEE showed slightly improved LVEF, no MR. Normal RV systolic function.  CPB time was 108 minutes. Cardiac output was 4 LPM. Heparin was fully reversed with protamine and the aortic and venous cannulas removed. Hemostasis was achieved. Mediastinal and left pleural drainage tubes were placed. The sternum was closed with #6 stainless steel wires. The fascia was closed with continuous # 1 vicryl suture. The subcutaneous tissue was closed with 2-0 vicryl continuous suture. The skin was closed with 3-0 vicryl subcuticular suture. All sponge, needle, and instrument counts were reported correct at the end of the case. Dry sterile dressings were placed over the  incisions and around the chest tubes which were connected to pleurevac suction. The patient was then transported to the surgical intensive care unit in stable condition.

## 2020-08-23 NOTE — Progress Notes (Signed)
Patient ID: Paul [name], male   DOB: 02-12-1941, 80 y.o.   MRN: [id number]  TCTS Evening Rounds:   He had reaction near completion of platelet transfusion with marked hypotension to 60 unresponsive to neo and NE. We had to give him 7 units of albumin for low PAP and epi to raise BP. Gave 60 mg solumedrol. After he stabilized I started epi drip and stopped milrinone in case milrinone was adding to hypotension.  Hemodynamically stable now on epi 6, NE 3 CI = 2.4  Remains on vent on Precedex  Urine output good  CT output has been 150-300/hr since return to ICU after appearing hemostatic in OR. It has been thinner and non-clotting so he got platelets and FFP. He had large pleural effusions preop so I wonder how much is weaping serous fluid.   Hgb dropped to 7.9 after all of that albumin and FFP so will transfuse 1 unit cells.  CBC    Component Value Date/Time   WBC 13.9 (H) 08/23/2020 1630   RBC 2.49 (L) 08/23/2020 1630   HGB 7.9 (L) 08/23/2020 1630   HCT 24.9 (L) 08/23/2020 1630   PLT 143 (L) 08/23/2020 1630   MCV 100.0 08/23/2020 1630   MCH 31.7 08/23/2020 1630   MCHC 31.7 08/23/2020 1630   RDW 13.5 08/23/2020 1630   LYMPHSABS 2.1 08/19/2020 0358   MONOABS 1.0 08/19/2020 0358   EOSABS 0.2 08/19/2020 0358   BASOSABS 0.0 08/19/2020 0358     BMET    Component Value Date/Time   NA 138 08/23/2020 1215   K 4.5 08/23/2020 1215   CL 101 08/23/2020 1215   CO2 26 08/23/2020 0505   GLUCOSE 120 (H) 08/23/2020 1215   BUN 26 (H) 08/23/2020 1215   CREATININE 0.70 08/23/2020 1215   CALCIUM 9.1 08/23/2020 0505   GFRNONAA >60 08/23/2020 0505     A/P:  Follow chest tube output for now. Transfuse 1 unit PRBC's. Continue current plans.

## 2020-08-23 NOTE — Transfer of Care (Signed)
Immediate Anesthesia Transfer of Care Note  Patient: Paul [name]  Procedure(s) Performed: CORONARY ARTERY BYPASS GRAFTING (CABG) X   THREE , LEFT INTERNAL MAMMARY HARVEST, RIGHT LEG ENDOSCOPIC SAPHENOUS VEIN HARVESTING, LEFT ENDOSCOPIC SAPHENOUS VEIN HARVEST (N/A Chest) TRANSESOPHAGEAL ECHOCARDIOGRAM (TEE) (N/A )  Patient Location: ICU  Anesthesia Type:General  Level of Consciousness: sedated and unresponsive  Airway & Oxygen Therapy: Patient remains intubated per anesthesia plan  Post-op Assessment: Report given to RN and Post -op Vital signs reviewed and stable  Post vital signs: Reviewed and stable  Last Vitals:  Vitals Value Taken Time  BP 108/76 08/23/20 1332  Temp 35.7 C 08/23/20 1336  Pulse 88 08/23/20 1336  Resp 12 08/23/20 1336  SpO2 100 % 08/23/20 1336  Vitals shown include unvalidated device data.  Last Pain:  Vitals:   08/23/20 0539  TempSrc: Oral  PainSc:       Patients Stated Pain Goal: 0 (08/22/20 0830)  Complications: No complications documented.

## 2020-08-23 NOTE — Progress Notes (Signed)
Patient ID: Paul [name], male   DOB: [birth year], 80 y.o.   MRN: [id number] TCTS  He has continued to have 300+ cc/hr out from his chest tubes. He has been hemodynamically stable. I think it is best to return to the OR for exploration. Discussed with daughter.

## 2020-08-23 NOTE — Anesthesia Procedure Notes (Signed)
Arterial Line Insertion Start/End3/04/2021 6:45 AM, 08/23/2020 6:50 AM Performed by: Heather Roberts, MD, Lynnell Chad, CRNA, CRNA  Preanesthetic checklist: patient identified, IV checked and surgical consent Lidocaine 1% used for infiltration and patient sedated Left, radial was placed Catheter size: 20 G  Attempts: 1 Procedure performed without using ultrasound guided technique. Following insertion, dressing applied and Biopatch. Post procedure assessment: normal  Patient tolerated the procedure well with no immediate complications.

## 2020-08-23 NOTE — Progress Notes (Signed)
  Echocardiogram Echocardiogram Transesophageal has been performed.  Janalyn Harder 08/23/2020, 8:43 AM

## 2020-08-23 NOTE — Anesthesia Preprocedure Evaluation (Signed)
Anesthesia Evaluation  Patient identified by MRN, date of birth, ID band Patient unresponsive  General Assessment Comment:Patient intubated, sedated  Reviewed: Allergy & Precautions, NPO status , Patient's Chart, lab work & pertinent test results, Unable to perform ROS - Chart review onlyPreop documentation limited or incomplete due to emergent nature of procedure.  Airway Mallampati: Intubated  TM Distance: >3 FB Neck ROM: Full    Dental  (+) Teeth Intact, Dental Advisory Given   Pulmonary       + intubated    Cardiovascular hypertension, Pt. on medications + CAD, + CABG (s/p CABG 08/23/20) and +CHF  Normal cardiovascular exam Rhythm:Regular Rate:Normal  IMPRESSIONS   1. Left ventricular ejection fraction, by estimation, is 25 to 30%. The left ventricle has severely decreased function. 2. The left ventricle demonstrates regional wall motion abnormalities . All mid-to-apical septal, mid-to-apical anterolateral, mid-to-apical anterior, apical inferior and apex appear akinetic. The rest of the LV walls are hypokinetic. 3. There is mild concentric left ventricular hypertrophy. Left ventricular diastolic parameters are consistent with Grade II diastolic dysfunction (pseudonormalization). 4. Swirling of contrast seen in the left ventricle consistent with low flow state. No LV thrombus visualized. 5. Right ventricular systolic function is normal. The right ventricular size is normal. 6. The mitral valve is abnormal. Trivial mitral valve regurgitation. No evidence of mitral stenosis. Moderate mitral annular calcification. 7. The aortic valve has an indeterminant number of cusps. There is moderate calcification of the aortic valve. There is moderate thickening of the aortic valve. Aortic valve regurgitation is not visualized. Mild aortic valve stenosis. Gradients low due  to low output state. 8. Wall motion concerning for multivessel  coronary artery disease.   Neuro/Psych    GI/Hepatic   Endo/Other    Renal/GU      Musculoskeletal   Abdominal   Peds  Hematology  (+) Blood dyscrasia, anemia ,   Anesthesia Other Findings   Reproductive/Obstetrics                             Anesthesia Physical Anesthesia Plan  ASA: V and emergent  Anesthesia Plan: General   Post-op Pain Management:    Induction: Inhalational  PONV Risk Score and Plan: 2 and Treatment may vary due to age or medical condition  Airway Management Planned: Oral ETT  Additional Equipment:   Intra-op Plan:   Post-operative Plan: Post-operative intubation/ventilation  Informed Consent: I have reviewed the patients History and Physical, chart, labs and discussed the procedure including the risks, benefits and alternatives for the proposed anesthesia with the patient or authorized representative who has indicated his/her understanding and acceptance.     Dental advisory given and Only emergency history available  Plan Discussed with: CRNA  Anesthesia Plan Comments: (POD0 CABG with high chest tube output to OR for re-exploration.  )        Anesthesia Quick Evaluation

## 2020-08-23 NOTE — Anesthesia Postprocedure Evaluation (Signed)
Anesthesia Post Note  Patient: Seung Nidiffer  Procedure(s) Performed: CORONARY ARTERY BYPASS GRAFTING (CABG) X   THREE , LEFT INTERNAL MAMMARY HARVEST, RIGHT LEG ENDOSCOPIC SAPHENOUS VEIN HARVESTING, LEFT ENDOSCOPIC SAPHENOUS VEIN HARVEST (N/A Chest) TRANSESOPHAGEAL ECHOCARDIOGRAM (TEE) (N/A )     Patient location during evaluation: SICU Anesthesia Type: General Level of consciousness: sedated Pain management: pain level controlled Vital Signs Assessment: post-procedure vital signs reviewed and stable Respiratory status: patient remains intubated per anesthesia plan Cardiovascular status: stable Postop Assessment: no apparent nausea or vomiting Anesthetic complications: no   No complications documented.  Last Vitals:  Vitals:   08/23/20 1415 08/23/20 1430  BP: 99/78 93/77  Pulse: 89 90  Resp: 13 12  Temp: (!) 35.6 C (!) 35.7 C  SpO2: 100% 100%    Last Pain:  Vitals:   08/23/20 1430  TempSrc: Core  PainSc:                  Ricard Faulkner DANIEL

## 2020-08-23 NOTE — Progress Notes (Signed)
ANTICOAGULATION CONSULT NOTE - Follow Up Consult  Pharmacy Consult for IV heparin Indication: CAD awaiting CABG  No Known Allergies  Patient Measurements: Height: 5\' 11"  (180.3 cm) Weight: 65.1 kg (143 lb 8.3 oz) IBW/kg (Calculated) : 75.3 Heparin Dosing Weight: 64.8 kg  Vital Signs: Temp: 98.1 F (36.7 C) (03/11 0539) Temp Source: Oral (03/11 0539) BP: 98/64 (03/11 0539) Pulse Rate: 76 (03/11 0539)  Labs: Recent Labs    08/21/20 0127 08/22/20 0234 08/23/20 0505  HGB 13.9 14.0 14.0  HCT 41.1 42.1 42.9  PLT 250 236 251  HEPARINUNFRC 0.50 0.69 0.63  CREATININE 1.02  --  1.07    Estimated Creatinine Clearance: 51.5 mL/min (by C-G formula based on SCr of 1.07 mg/dL).   Medications:  Infusions:  . cefUROXime (ZINACEF)  IV    . cefUROXime (ZINACEF)  IV    . dexmedetomidine    . heparin 30,000 units/NS 1000 mL solution for CELLSAVER    . heparin 1,400 Units/hr (08/22/20 1228)  . milrinone    . nitroGLYCERIN    . norepinephrine    . tranexamic acid (CYKLOKAPRON) infusion ([facility])    . vancomycin      Assessment: 80 year old male presented to OSH on 3/2 for h/o CP since Dec and hypoxia noted on outpt OV, subsequently had cardiac cath revealing 3VD, now transferred to [facility] for CABG evaluation, continued on heparin dosed by pharmacy.  Heparin level today is therapeutic at 0.63 on 1400 units/hr. CBC is wnl and stable. No s/sx bleeding reported. CABG is scheduled for today.  Goal of Therapy:  Heparin level 0.3-0.7 units/ml Monitor platelets by anticoagulation protocol: Yes   Plan:  Continue heparin at 1400 units/hour Monitor daily heparin level, CBC, s/sx bleeding  [facility], PharmD PGY-1 Pharmacy Resident 08/23/2020 6:43 AM Please see AMION for all pharmacy numbers

## 2020-08-23 NOTE — Anesthesia Procedure Notes (Signed)
Central Venous Catheter Insertion Performed by: Heather Roberts, MD, anesthesiologist Start/End3/04/2021 6:54 AM, 08/23/2020 7:06 AM Patient location: Pre-op. Preanesthetic checklist: patient identified, IV checked, site marked, risks and benefits discussed, surgical consent, monitors and equipment checked, pre-op evaluation, timeout performed and anesthesia consent Position: Trendelenburg Lidocaine 1% used for infiltration and patient sedated Hand hygiene performed , maximum sterile barriers used  and Seldinger technique used Catheter size: 9 Fr Total catheter length 8. PA cath was placed.Sheath introducer Swan type:thermodilution PA Cath depth:50 Procedure performed using ultrasound guided technique. Ultrasound Notes:anatomy identified, needle tip was noted to be adjacent to the nerve/plexus identified, no ultrasound evidence of intravascular and/or intraneural injection and image(s) printed for medical record Attempts: 1 Following insertion, line sutured and dressing applied. Post procedure assessment: free fluid flow, blood return through all ports and no air  Patient tolerated the procedure well with no immediate complications.

## 2020-08-23 NOTE — Brief Op Note (Addendum)
08/19/2020 - 08/23/2020  11:40 AM  PATIENT:  Paul [name]  80 y.o. 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  PROCEDURE:  TRANSESOPHAGEAL ECHOCARDIOGRAM (TEE),CORONARY ARTERY BYPASS GRAFTING (CABG) X 3 (LIMA to OM1, SVG to OM2, SVG to PLB) using LEFT INTERNAL MAMMARY HARVESTRIGHT THIGH ENDOSCOPIC SAPHENOUS VEIN HARVESTING, LEFT THIGH ENDOSCOPIC SAPHENOUS VEIN HARVEST EVH HARVEST TIME: 43 minutes; EVH PREP TIME: 20 minutes  Findings:LAD diffusely diseased;viability study showed non viability in the apical anterior/septal, inferior walls and apex  SURGEON:  Surgeon(s) and Role:    Alleen Borne, MD - Primary  PHYSICIAN ASSISTANT: Doree Fudge PA-C  ASSISTANTS: Benay Spice RNFA  ANESTHESIA:   general  EBL:  Per anesthesia, perfusion record  DRAINS: Chest tubes placed in the mediastinal and pleural spaces   COUNTS CORRECT:  YES  DICTATION: .Dragon Dictation  PLAN OF CARE: Admit to inpatient   PATIENT DISPOSITION:  ICU - intubated and hemodynamically stable.   Delay start of Pharmacological VTE agent (>24hrs) due to surgical blood loss or risk of bleeding: yes  BASELINE WEIGHT: 65 kg

## 2020-08-23 NOTE — Progress Notes (Signed)
Pt in OR .

## 2020-08-23 NOTE — Anesthesia Procedure Notes (Signed)
Procedure Name: Intubation Date/Time: 08/23/2020 7:49 AM Performed by: Lynnell Chad, CRNA Pre-anesthesia Checklist: Patient identified, Emergency Drugs available, Suction available and Patient being monitored Patient Re-evaluated:Patient Re-evaluated prior to induction Oxygen Delivery Method: Circle System Utilized Preoxygenation: Pre-oxygenation with 100% oxygen Induction Type: IV induction Ventilation: Mask ventilation without difficulty Laryngoscope Size: Miller and 2 Grade View: Grade II Tube type: Oral Tube size: 8.0 mm Number of attempts: 1 Airway Equipment and Method: Stylet and Oral airway Placement Confirmation: ETT inserted through vocal cords under direct vision,  positive ETCO2 and breath sounds checked- equal and bilateral Secured at: 23 cm Tube secured with: Tape Dental Injury: Teeth and Oropharynx as per pre-operative assessment

## 2020-08-24 ENCOUNTER — Inpatient Hospital Stay (HOSPITAL_COMMUNITY): Payer: Medicare Other

## 2020-08-24 LAB — POCT I-STAT 7, (LYTES, BLD GAS, ICA,H+H)
Acid-Base Excess: 0 mmol/L (ref 0.0–2.0)
Acid-Base Excess: 1 mmol/L (ref 0.0–2.0)
Acid-base deficit: 1 mmol/L (ref 0.0–2.0)
Acid-base deficit: 1 mmol/L (ref 0.0–2.0)
Bicarbonate: 24.4 mmol/L (ref 20.0–28.0)
Bicarbonate: 24.3 mmol/L (ref 20.0–28.0)
Bicarbonate: 24.4 mmol/L (ref 20.0–28.0)
Bicarbonate: 24.3 mmol/L (ref 20.0–28.0)
Calcium, Ion: 1.27 mmol/L (ref 1.15–1.40)
Calcium, Ion: 1.22 mmol/L (ref 1.15–1.40)
Calcium, Ion: 1.21 mmol/L (ref 1.15–1.40)
Calcium, Ion: 1.28 mmol/L (ref 1.15–1.40)
HCT: 30 % — ABNORMAL LOW (ref 39.0–52.0)
HCT: 26 % — ABNORMAL LOW (ref 39.0–52.0)
HCT: 24 % — ABNORMAL LOW (ref 39.0–52.0)
HCT: 25 % — ABNORMAL LOW (ref 39.0–52.0)
Hemoglobin: 10.2 g/dL — ABNORMAL LOW (ref 13.0–17.0)
Hemoglobin: 8.8 g/dL — ABNORMAL LOW (ref 13.0–17.0)
Hemoglobin: 8.2 g/dL — ABNORMAL LOW (ref 13.0–17.0)
Hemoglobin: 8.5 g/dL — ABNORMAL LOW (ref 13.0–17.0)
O2 Saturation: 100 %
O2 Saturation: 100 %
O2 Saturation: 100 %
O2 Saturation: 99 %
Patient temperature: 35.7
Patient temperature: 36.3
Patient temperature: 36.3
Patient temperature: 36.4
Potassium: 4.2 mmol/L (ref 3.5–5.1)
Potassium: 4.3 mmol/L (ref 3.5–5.1)
Potassium: 4.5 mmol/L (ref 3.5–5.1)
Potassium: 4.5 mmol/L (ref 3.5–5.1)
Sodium: 140 mmol/L (ref 135–145)
Sodium: 139 mmol/L (ref 135–145)
Sodium: 137 mmol/L (ref 135–145)
Sodium: 138 mmol/L (ref 135–145)
TCO2: 25 mmol/L (ref 22–32)
TCO2: 26 mmol/L (ref 22–32)
TCO2: 25 mmol/L (ref 22–32)
TCO2: 26 mmol/L (ref 22–32)
pCO2 arterial: 33.7 mmHg (ref 32.0–48.0)
pCO2 arterial: 41.4 mmHg (ref 32.0–48.0)
pCO2 arterial: 34.1 mmHg (ref 32.0–48.0)
pCO2 arterial: 43.5 mmHg (ref 32.0–48.0)
pH, Arterial: 7.462 — ABNORMAL HIGH (ref 7.350–7.450)
pH, Arterial: 7.372 (ref 7.350–7.450)
pH, Arterial: 7.461 — ABNORMAL HIGH (ref 7.350–7.450)
pH, Arterial: 7.352 (ref 7.350–7.450)
pO2, Arterial: 230 mmHg — ABNORMAL HIGH (ref 83.0–108.0)
pO2, Arterial: 199 mmHg — ABNORMAL HIGH (ref 83.0–108.0)
pO2, Arterial: 210 mmHg — ABNORMAL HIGH (ref 83.0–108.0)
pO2, Arterial: 133 mmHg — ABNORMAL HIGH (ref 83.0–108.0)

## 2020-08-24 LAB — PREPARE CRYOPRECIPITATE: Unit division: 0

## 2020-08-24 LAB — BASIC METABOLIC PANEL
Anion gap: 7 (ref 5–15)
Anion gap: 5 (ref 5–15)
BUN: 20 mg/dL (ref 8–23)
BUN: 21 mg/dL (ref 8–23)
CO2: 21 mmol/L — ABNORMAL LOW (ref 22–32)
CO2: 26 mmol/L (ref 22–32)
Calcium: 8.4 mg/dL — ABNORMAL LOW (ref 8.9–10.3)
Calcium: 8.6 mg/dL — ABNORMAL LOW (ref 8.9–10.3)
Chloride: 106 mmol/L (ref 98–111)
Chloride: 103 mmol/L (ref 98–111)
Creatinine, Ser: 0.86 mg/dL (ref 0.61–1.24)
Creatinine, Ser: 1.09 mg/dL (ref 0.61–1.24)
GFR, Estimated: >60 mL/min (ref >60)
GFR, Estimated: >60 mL/min (ref >60)
Glucose, Bld: 135 mg/dL — ABNORMAL HIGH (ref 70–99)
Glucose, Bld: 127 mg/dL — ABNORMAL HIGH (ref 70–99)
Potassium: 4.4 mmol/L (ref 3.5–5.1)
Potassium: 4.8 mmol/L (ref 3.5–5.1)
Sodium: 134 mmol/L — ABNORMAL LOW (ref 135–145)
Sodium: 134 mmol/L — ABNORMAL LOW (ref 135–145)

## 2020-08-24 LAB — CBC
HCT: 26 % — ABNORMAL LOW (ref 39.0–52.0)
HCT: 24.6 % — ABNORMAL LOW (ref 39.0–52.0)
HCT: 25.6 % — ABNORMAL LOW (ref 39.0–52.0)
Hemoglobin: 8.8 g/dL — ABNORMAL LOW (ref 13.0–17.0)
Hemoglobin: 8.7 g/dL — ABNORMAL LOW (ref 13.0–17.0)
Hemoglobin: 8.7 g/dL — ABNORMAL LOW (ref 13.0–17.0)
MCH: 32.6 pg (ref 26.0–34.0)
MCH: 33.2 pg (ref 26.0–34.0)
MCH: 32.5 pg (ref 26.0–34.0)
MCHC: 33.8 g/dL (ref 30.0–36.0)
MCHC: 35.4 g/dL (ref 30.0–36.0)
MCHC: 34 g/dL (ref 30.0–36.0)
MCV: 96.3 fL (ref 80.0–100.0)
MCV: 93.9 fL (ref 80.0–100.0)
MCV: 95.5 fL (ref 80.0–100.0)
Platelets: 128 10*3/uL — ABNORMAL LOW (ref 150–400)
Platelets: 122 10*3/uL — ABNORMAL LOW (ref 150–400)
Platelets: 135 10*3/uL — ABNORMAL LOW (ref 150–400)
RBC: 2.7 MIL/uL — ABNORMAL LOW (ref 4.22–5.81)
RBC: 2.62 MIL/uL — ABNORMAL LOW (ref 4.22–5.81)
RBC: 2.68 MIL/uL — ABNORMAL LOW (ref 4.22–5.81)
RDW: 14.5 % (ref 11.5–15.5)
RDW: 14.5 % (ref 11.5–15.5)
RDW: 14.6 % (ref 11.5–15.5)
WBC: 11.2 10*3/uL — ABNORMAL HIGH (ref 4.0–10.5)
WBC: 12.9 10*3/uL — ABNORMAL HIGH (ref 4.0–10.5)
WBC: 16 10*3/uL — ABNORMAL HIGH (ref 4.0–10.5)
nRBC: 0 % (ref 0.0–0.2)
nRBC: 0 % (ref 0.0–0.2)
nRBC: 0 % (ref 0.0–0.2)

## 2020-08-24 LAB — GLUCOSE, CAPILLARY
Glucose-Capillary: 118 mg/dL — ABNORMAL HIGH (ref 70–99)
Glucose-Capillary: 125 mg/dL — ABNORMAL HIGH (ref 70–99)
Glucose-Capillary: 142 mg/dL — ABNORMAL HIGH (ref 70–99)
Glucose-Capillary: 136 mg/dL — ABNORMAL HIGH (ref 70–99)
Glucose-Capillary: 138 mg/dL — ABNORMAL HIGH (ref 70–99)
Glucose-Capillary: 147 mg/dL — ABNORMAL HIGH (ref 70–99)
Glucose-Capillary: 139 mg/dL — ABNORMAL HIGH (ref 70–99)
Glucose-Capillary: 131 mg/dL — ABNORMAL HIGH (ref 70–99)
Glucose-Capillary: 125 mg/dL — ABNORMAL HIGH (ref 70–99)
Glucose-Capillary: 134 mg/dL — ABNORMAL HIGH (ref 70–99)
Glucose-Capillary: 119 mg/dL — ABNORMAL HIGH (ref 70–99)
Glucose-Capillary: 162 mg/dL — ABNORMAL HIGH (ref 70–99)
Glucose-Capillary: 121 mg/dL — ABNORMAL HIGH (ref 70–99)
Glucose-Capillary: 108 mg/dL — ABNORMAL HIGH (ref 70–99)
Glucose-Capillary: 134 mg/dL — ABNORMAL HIGH (ref 70–99)

## 2020-08-24 LAB — BPAM CRYOPRECIPITATE
Blood Product Expiration Date: 202203120522
ISSUE DATE / TIME: 202203112341
Unit Type and Rh: 5100

## 2020-08-24 LAB — POCT I-STAT EG7
Acid-base deficit: 2 mmol/L (ref 0.0–2.0)
Bicarbonate: 23.9 mmol/L (ref 20.0–28.0)
Calcium, Ion: 1.25 mmol/L (ref 1.15–1.40)
HCT: 21 % — ABNORMAL LOW (ref 39.0–52.0)
Hemoglobin: 7.1 g/dL — ABNORMAL LOW (ref 13.0–17.0)
O2 Saturation: 81 %
Potassium: 4.3 mmol/L (ref 3.5–5.1)
Sodium: 138 mmol/L (ref 135–145)
TCO2: 25 mmol/L (ref 22–32)
pCO2, Ven: 45.6 mmHg (ref 44.0–60.0)
pH, Ven: 7.327 (ref 7.250–7.430)
pO2, Ven: 49 mmHg — ABNORMAL HIGH (ref 32.0–45.0)

## 2020-08-24 LAB — PREPARE PLATELET PHERESIS: Unit division: 0

## 2020-08-24 LAB — APTT: aPTT: 39 seconds — ABNORMAL HIGH (ref 24–36)

## 2020-08-24 LAB — BPAM PLATELET PHERESIS
Blood Product Expiration Date: 202203122359
ISSUE DATE / TIME: 202203111403
Unit Type and Rh: 7300

## 2020-08-24 LAB — PROTIME-INR
INR: 1.4 — ABNORMAL HIGH (ref 0.8–1.2)
Prothrombin Time: 16.2 seconds — ABNORMAL HIGH (ref 11.4–15.2)

## 2020-08-24 LAB — MAGNESIUM
Magnesium: 2.1 mg/dL (ref 1.7–2.4)
Magnesium: 2 mg/dL (ref 1.7–2.4)

## 2020-08-24 MED ORDER — ORAL CARE MOUTH RINSE
15.0000 mL | OROMUCOSAL | Status: DC
  Administered 2020-08-24 (×4): 15 mL via OROMUCOSAL

## 2020-08-24 MED ORDER — MIDAZOLAM HCL 2 MG/2ML IJ SOLN
INTRAMUSCULAR | Status: AC
  Filled 2020-08-24: qty 2

## 2020-08-24 MED ORDER — CHLORHEXIDINE GLUCONATE 0.12% ORAL RINSE (MEDLINE KIT)
15.0000 mL | Freq: Two times a day (BID) | OROMUCOSAL | Status: DC
  Administered 2020-08-24 – 2020-08-27 (×7): 15 mL via OROMUCOSAL

## 2020-08-24 MED ORDER — FUROSEMIDE 10 MG/ML IJ SOLN
40.0000 mg | Freq: Once | INTRAMUSCULAR | Status: AC
  Administered 2020-08-24: 40 mg via INTRAVENOUS
  Filled 2020-08-24: qty 4

## 2020-08-24 MED ORDER — ALBUMIN HUMAN 5 % IV SOLN
12.5000 g | Freq: Once | INTRAVENOUS | Status: AC
  Administered 2020-08-24: 12.5 g via INTRAVENOUS

## 2020-08-24 MED ORDER — INSULIN DETEMIR 100 UNIT/ML ~~LOC~~ SOLN
20.0000 [IU] | Freq: Every day | SUBCUTANEOUS | Status: DC
  Administered 2020-08-24: 20 [IU] via SUBCUTANEOUS
  Filled 2020-08-24 (×2): qty 0.2

## 2020-08-24 MED ORDER — LACTATED RINGERS IV SOLN: INTRAVENOUS | Status: DC | PRN

## 2020-08-24 MED ORDER — INSULIN ASPART 100 UNIT/ML ~~LOC~~ SOLN
0.0000 [IU] | SUBCUTANEOUS | Status: DC
  Administered 2020-08-24: 4 [IU] via SUBCUTANEOUS
  Administered 2020-08-24 – 2020-08-25 (×2): 2 [IU] via SUBCUTANEOUS

## 2020-08-24 MED ORDER — INSULIN DETEMIR 100 UNIT/ML ~~LOC~~ SOLN: 20.0000 [IU] | Freq: Every day | SUBCUTANEOUS | Status: DC

## 2020-08-24 MED ORDER — SODIUM CHLORIDE (PF) 0.9 % IJ SOLN
OROMUCOSAL | Status: DC | PRN
  Administered 2020-08-23 – 2020-08-24 (×2): 4 mL via TOPICAL

## 2020-08-24 NOTE — Progress Notes (Signed)
1 Day Post-Op Procedure(s) (LRB): RE- EXPLORATION POST CABG (N/A) Subjective: No complaints. Extubated around 4 am.  Objective: Vital signs in last 24 hours: Temp:  [95.36 F (35.2 C)-99 F (37.2 C)] 97.5 F (36.4 C) (03/12 0700) Pulse Rate:  [88-179] 89 (03/12 0700) Cardiac Rhythm: A-V Sequential paced (03/12 0410) Resp:  [12-29] 17 (03/12 0700) BP: (82-116)/(49-78) 112/73 (03/12 0700) SpO2:  [98 %-100 %] 100 % (03/12 0700) Arterial Line BP: (61-134)/(34-70) 127/51 (03/12 0700) FiO2 (%):  [40 %-50 %] 40 % (03/12 0340) Weight:  [67.2 kg] 67.2 kg (03/12 0500)  Hemodynamic parameters for last 24 hours: PAP: (12-45)/(-7-22) 23/4 CO:  [2.9 L/min-5.2 L/min] 3.9 L/min CI:  [1.6 L/min/m2-2.8 L/min/m2] 2.1 L/min/m2  Intake/Output from previous day: 03/11 0701 - 03/12 0700 In: 8469.3 [P.O.:1637; I.V.:4050.4; Blood:1591; IV Piggyback:1190.9] Out: 7715 [Urine:2770; Blood:1300; Chest Tube:3645] Intake/Output this shift: No intake/output data recorded.  General appearance: alert and cooperative Neurologic: intact Heart: regular rate and rhythm, S1, S2 normal, no murmur, + rub from tubes Lungs: clear to auscultation bilaterally Extremities: edema moderate Wound: dressings dry  Lab Results: Recent Labs    08/24/20 0115 08/24/20 0120 08/24/20 0400 08/24/20 0403 08/24/20 0502  WBC 11.2*  --  12.9*  --   --   HGB 8.8*   < > 8.7* 8.2* 8.5*  HCT 26.0*   < > 24.6* 24.0* 25.0*  PLT 128*  --  122*  --   --    < > = values in this interval not displayed.   BMET:  Recent Labs    08/23/20 2030 08/23/20 2332 08/24/20 0403 08/24/20 0502  NA 136   < > 137  134* 138  K 4.5   < > 4.5  4.4 4.5  CL 107  --  106  --   CO2 24  --  21*  --   GLUCOSE 174*  --  135*  --   BUN 22  --  20  --   CREATININE 0.92  --  0.86  --   CALCIUM 8.5*  --  8.4*  --    < > = values in this interval not displayed.    PT/INR:  Recent Labs    08/24/20 0115  LABPROT 16.2*  INR 1.4*   ABG     Component Value Date/Time   PHART 7.352 08/24/2020 0502   HCO3 24.3 08/24/2020 0502   TCO2 26 08/24/2020 0502   ACIDBASEDEF 1.0 08/24/2020 0502   O2SAT 99.0 08/24/2020 0502   CBG (last 3)  Recent Labs    08/24/20 0459 08/24/20 0601 08/24/20 0651  GLUCAP 138* 147* 139*   CXR: clear  Assessment/Plan:  POD 1 CABG  Hemodynamically stable in sinus rhythm 88. He is still on epi 4, NE 8. Preop EF 30%. Wean epi to 2 and NE to off as BP allows. Hold off on beta blocker until off vasopressors.  Volume excess: wt is about 5 lbs over preop. Low filling pressures. Will hold off on diuresis until stable off pressors.  DM: glucose under good control. Transition to levemir and SSI.  Keep chest tubes in for now.  Remove swan once epi is down.  IS, OOB.  LOS: 5 days    Alleen Borne 08/24/2020

## 2020-08-24 NOTE — Progress Notes (Signed)
Patient ID: Paul [name], male   DOB: 09-Nov-1940, 80 y.o.   MRN: [id number] TCTS  Hemodynamically stable today. NE almost off. Epi 3  CI 2.3  Urine out put ok  CT output low.  Awake and alert, getting bath.

## 2020-08-24 NOTE — Anesthesia Postprocedure Evaluation (Signed)
Anesthesia Post Note  Patient: Paul [name]  Procedure(s) Performed: RE- EXPLORATION POST CABG (N/A )     Patient location during evaluation: SICU Anesthesia Type: General Level of consciousness: sedated Pain management: pain level controlled Vital Signs Assessment: post-procedure vital signs reviewed and stable Respiratory status: patient remains intubated per anesthesia plan Cardiovascular status: stable Postop Assessment: no apparent nausea or vomiting Anesthetic complications: no   No complications documented.  Last Vitals:  Vitals:   08/23/20 2300 08/24/20 0100  BP: 116/74 101/71  Pulse: 89 90  Resp: 20 16  Temp:    SpO2: 100% 100%    Last Pain:  Vitals:   08/23/20 2000  TempSrc: Core  PainSc:                  Cecile Hearing

## 2020-08-24 NOTE — Brief Op Note (Signed)
08/23/2020 - 08/24/2020  12:49 AM  PATIENT:  Paul [name]  80 y.o. male  PRE-OPERATIVE DIAGNOSIS:  BLEEDING  POST-OPERATIVE DIAGNOSIS:  BLEEDING  PROCEDURE:  Procedure(s): RE- EXPLORATION POST CABG (N/A)  SURGEON:  Surgeon(s) and Role:    * Leotha Westermeyer, Payton Doughty, MD - Primary  PHYSICIAN ASSISTANT: none  ASSISTANTS: RNFA  ANESTHESIA:   general  EBL:  400 mL   BLOOD ADMINISTERED:one unit PRBC and one unit cryo  DRAINS: previous tubes left in place   LOCAL MEDICATIONS USED:  NONE  SPECIMEN:  No Specimen  DISPOSITION OF SPECIMEN:  N/A  COUNTS:  YES  TOURNIQUET:  * No tourniquets in log *  DICTATION: .Note written in EPIC  PLAN OF CARE: Admit to inpatient   PATIENT DISPOSITION:  ICU - intubated and hemodynamically stable.   Delay start of Pharmacological VTE agent (>24hrs) due to surgical blood loss or risk of bleeding: yes

## 2020-08-24 NOTE — Op Note (Signed)
08/24/2020 Paul [name] [id number]  Surgeon: Alleen Borne, MD   First Assistant:RNFA  Preoperative Diagnosis: Bleeding s/p CABG  Postoperative Diagnosis: Same   Procedure:  1.  Exploration of mediastinum for bleeding.  Anesthesia: General Endotracheal   Clinical History/Surgical Indication:   The patient is a 80 year old gentleman who underwent coronary artery bypass graft surgery x3 yesterday.  He was hemostatic at the end of surgery but then began having bloody chest tube output shortly after arrival to the ICU which continued throughout the day and evening.  Coagulopathy was corrected but he continued to have about 300 cc out per hour.  Every time it seemed that it may improve the next hour was more.  I decided to take the patient back to the operating room for exploration.  I discussed the procedure with the patient's daughter by telephone.  Preparation:  The patient was taken directly to the operating room. The patient was positioned supine on the operating room table. After being placed under general endotracheal anesthesia by the anesthesia team the neck and chest were prepped with betadine soap and solution and draped in the usual sterile manner.  The previously placed chest tubes remained in place.  A surgical time-out was taken and the correct patient and operative procedure were confirmed with the nursing and anesthesia staff.   Exploration of mediastinum:  The median sternotomy incision was opened and the sternal wires removed.  Sternal retractor was placed.  There was a small amount of fresh blood present within the mediastinum.  There was some clot present particularly inferiorly around the chest tube entry sites.  This was all removed.  There was some oozing around while the chest tube sites and this was treated with electrocautery.  The aortic and venous cannulation sites appeared hemostatic.  The anastomoses were checked and were all hemostatic.  There is no significant  blood and pleural spaces.  There was lot of serous fluid weeping from the soft tissues.  Completion:   Mediastinal and pleural drainage tubes were declotted and return to the normal position.  The sternum was closed with  #6 stainless steel wires. The fascia was closed with continuous # 1 vicryl suture. The subcutaneous tissue was closed with 2-0 vicryl continuous suture. The skin was closed with 3-0 vicryl subcuticular suture. All sponge, needle, and instrument counts were reported correct at the end of the case. Dry sterile dressings were placed over the incisions and around the chest tubes which were connected to pleurevac suction. The patient was then transported to the surgical intensive care unit in satisfactory and stable condition.

## 2020-08-24 NOTE — Transfer of Care (Signed)
Immediate Anesthesia Transfer of Care Note  Patient: Judd Gaudier  Procedure(s) Performed: RE- EXPLORATION POST CABG (N/A )  Patient Location: SICU  Anesthesia Type:General  Level of Consciousness: Patient remains intubated per anesthesia plan  Airway & Oxygen Therapy: Patient placed on Ventilator (see vital sign flow sheet for setting)  Post-op Assessment: Report given to RN and Post -op Vital signs reviewed and stable  Post vital signs: Reviewed and stable  Last Vitals:  Vitals Value Taken Time  BP    Temp    Pulse    Resp    SpO2      Last Pain:  Vitals:   08/23/20 2000  TempSrc: Core  PainSc:       Patients Stated Pain Goal: 0 (08/22/20 0830)  Complications: No complications documented.

## 2020-08-24 NOTE — Progress Notes (Signed)
Pt back to 2H-16

## 2020-08-24 NOTE — Procedures (Signed)
Extubation Procedure Note  Patient Details:   Name: Paul [name] DOB: 11-21-1940 MRN: [id number]   Airway Documentation:    Vent end date: 08/24/20 Vent end time: 0405   Evaluation  O2 sats: stable throughout Complications: No apparent complications Patient did tolerate procedure well. Bilateral Breath Sounds: Clear,Diminished   Yes   Cuff leak noted prior to extubation.  NIF -25 VC 0.9L  Patient placed on 4L nasal cannula   Devantae Babe R Kailash Hinze 08/24/2020, 4:14 AM

## 2020-08-25 ENCOUNTER — Inpatient Hospital Stay (HOSPITAL_COMMUNITY): Payer: Medicare Other

## 2020-08-25 LAB — GLUCOSE, CAPILLARY
Glucose-Capillary: 106 mg/dL — ABNORMAL HIGH (ref 70–99)
Glucose-Capillary: 112 mg/dL — ABNORMAL HIGH (ref 70–99)
Glucose-Capillary: 137 mg/dL — ABNORMAL HIGH (ref 70–99)
Glucose-Capillary: 184 mg/dL — ABNORMAL HIGH (ref 70–99)
Glucose-Capillary: 188 mg/dL — ABNORMAL HIGH (ref 70–99)
Glucose-Capillary: 93 mg/dL (ref 70–99)

## 2020-08-25 LAB — CBC
HCT: 27.1 % — ABNORMAL LOW (ref 39.0–52.0)
Hemoglobin: 8.8 g/dL — ABNORMAL LOW (ref 13.0–17.0)
MCH: 31.9 pg (ref 26.0–34.0)
MCHC: 32.5 g/dL (ref 30.0–36.0)
MCV: 98.2 fL (ref 80.0–100.0)
Platelets: 129 10*3/uL — ABNORMAL LOW (ref 150–400)
RBC: 2.76 MIL/uL — ABNORMAL LOW (ref 4.22–5.81)
RDW: 14.8 % (ref 11.5–15.5)
WBC: 16.8 10*3/uL — ABNORMAL HIGH (ref 4.0–10.5)
nRBC: 0 % (ref 0.0–0.2)

## 2020-08-25 LAB — BASIC METABOLIC PANEL
Anion gap: 5 (ref 5–15)
BUN: 24 mg/dL — ABNORMAL HIGH (ref 8–23)
CO2: 29 mmol/L (ref 22–32)
Calcium: 8.8 mg/dL — ABNORMAL LOW (ref 8.9–10.3)
Chloride: 101 mmol/L (ref 98–111)
Creatinine, Ser: 1.17 mg/dL (ref 0.61–1.24)
GFR, Estimated: >60 mL/min (ref >60)
Glucose, Bld: 104 mg/dL — ABNORMAL HIGH (ref 70–99)
Potassium: 5 mmol/L (ref 3.5–5.1)
Sodium: 135 mmol/L (ref 135–145)

## 2020-08-25 MED ORDER — FUROSEMIDE 10 MG/ML IJ SOLN
40.0000 mg | Freq: Two times a day (BID) | INTRAMUSCULAR | Status: AC
  Administered 2020-08-25 (×2): 40 mg via INTRAVENOUS
  Filled 2020-08-25 (×2): qty 4

## 2020-08-25 MED ORDER — INSULIN ASPART 100 UNIT/ML ~~LOC~~ SOLN
0.0000 [IU] | Freq: Three times a day (TID) | SUBCUTANEOUS | Status: DC
  Administered 2020-08-25 – 2020-08-26 (×2): 4 [IU] via SUBCUTANEOUS
  Administered 2020-08-27: 2 [IU] via SUBCUTANEOUS

## 2020-08-25 MED ORDER — AMIODARONE LOAD VIA INFUSION
150.0000 mg | Freq: Once | INTRAVENOUS | Status: AC
  Administered 2020-08-25: 150 mg via INTRAVENOUS
  Filled 2020-08-25: qty 83.34

## 2020-08-25 MED ORDER — AMIODARONE HCL IN DEXTROSE 360-4.14 MG/200ML-% IV SOLN
60.0000 mg/h | INTRAVENOUS | Status: AC
  Administered 2020-08-25 (×2): 60 mg/h via INTRAVENOUS

## 2020-08-25 MED ORDER — MIDODRINE HCL 5 MG PO TABS
10.0000 mg | ORAL_TABLET | Freq: Three times a day (TID) | ORAL | Status: DC
  Administered 2020-08-25 – 2020-08-27 (×7): 10 mg via ORAL
  Filled 2020-08-25 (×7): qty 2

## 2020-08-25 MED ORDER — AMIODARONE HCL IN DEXTROSE 360-4.14 MG/200ML-% IV SOLN
30.0000 mg/h | INTRAVENOUS | Status: DC
  Administered 2020-08-26 – 2020-08-27 (×3): 30 mg/h via INTRAVENOUS
  Filled 2020-08-25 (×2): qty 200
  Filled 2020-08-25: qty 400
  Filled 2020-08-25: qty 200

## 2020-08-25 MED ORDER — FE FUMARATE-B12-VIT C-FA-IFC PO CAPS
1.0000 | ORAL_CAPSULE | Freq: Two times a day (BID) | ORAL | Status: DC
  Administered 2020-08-25 – 2020-08-30 (×11): 1 via ORAL
  Filled 2020-08-25 (×12): qty 1

## 2020-08-25 NOTE — Progress Notes (Signed)
Patient ID: Paul [name], male   DOB: Jan 12, 1941, 80 y.o.   MRN: [id number] TCTS Evening Rounds:  Hemodynamically stable but still on epi 1-2 mcg to support BP.   Went into atrial fib with RVR earlier today and converted with IV amio.  Diuresing well -1300 cc so far today.  Will start midodrine to support BP so we can get epi off.

## 2020-08-25 NOTE — Plan of Care (Signed)

## 2020-08-25 NOTE — Progress Notes (Addendum)
2 Days Post-Op Procedure(s) (LRB): RE- EXPLORATION POST CABG (N/A) Subjective: No complaints. Walked this am. Sitting up eating breakfast Pain under control  Objective: Vital signs in last 24 hours: Temp:  [97.6 F (36.4 C)-98.96 F (37.2 C)] 98.5 F (36.9 C) (03/13 0345) Pulse Rate:  [25-157] 66 (03/13 0530) Cardiac Rhythm: Normal sinus rhythm (03/13 0400) Resp:  [11-29] 24 (03/13 0530) BP: (94-114)/(60-78) 97/65 (03/13 0515) SpO2:  [91 %-100 %] 91 % (03/13 0530) Arterial Line BP: (103-137)/(46-63) 137/58 (03/12 1800) Weight:  [65.5 kg] 65.5 kg (03/13 0500)  Hemodynamic parameters for last 24 hours: PAP: (26-41)/(7-17) 39/12 CO:  [3.8 L/min-4.3 L/min] 4.3 L/min CI:  [2.1 L/min/m2-2.3 L/min/m2] 2.3 L/min/m2  Intake/Output from previous day: 03/12 0701 - 03/13 0700 In: 1902.1 [P.O.:1080; I.V.:822.1] Out: 3070 [Urine:2160; Chest Tube:910] Intake/Output this shift: No intake/output data recorded.  General appearance: alert and cooperative Neurologic: intact Heart: regular rate and rhythm, S1, S2 normal, no murmur Lungs: clear to auscultation bilaterally Extremities: edema mild Wound: dressings dry  Lab Results: Recent Labs    08/24/20 1744 08/25/20 0405  WBC 16.0* 16.8*  HGB 8.7* 8.8*  HCT 25.6* 27.1*  PLT 135* 129*   BMET:  Recent Labs    08/24/20 1744 08/25/20 0405  NA 134* 135  K 4.8 5.0  CL 103 101  CO2 26 29  GLUCOSE 127* 104*  BUN 21 24*  CREATININE 1.09 1.17  CALCIUM 8.6* 8.8*    PT/INR:  Recent Labs    08/24/20 0115  LABPROT 16.2*  INR 1.4*   ABG    Component Value Date/Time   PHART 7.352 08/24/2020 0502   HCO3 24.3 08/24/2020 0502   TCO2 26 08/24/2020 0502   ACIDBASEDEF 1.0 08/24/2020 0502   O2SAT 99.0 08/24/2020 0502   CBG (last 3)  Recent Labs    08/24/20 1919 08/24/20 2328 08/25/20 0326  GLUCAP 108* 134* 106*   CXR: ok  Assessment/Plan:  POD 2 Hemodynamically stable on epi 2. Wean as tolerated. preop EF 30%. Hold off  on beta blocker and ACE I until BP stable off inotropes.  Volume excess: wt is down 4 lbs from yesterday and only 1 lb over preop but he is edematous. Will continue diuretic today.  DM: glucose under good control on Levemir and SSI. On no meds preop with Hgb A1c of 6.2. will stop Levemir to prevent hypoglycemia and continue SSI for now.  DC MT's. Keep pleural tubes today since he is still draining a lot of serous fluid and had large bilateral pleural effusions preop.  Continue IS, ambulation.   LOS: 6 days    Alleen Borne 08/25/2020

## 2020-08-26 ENCOUNTER — Inpatient Hospital Stay (HOSPITAL_COMMUNITY): Payer: Medicare Other

## 2020-08-26 ENCOUNTER — Encounter (HOSPITAL_COMMUNITY): Payer: Self-pay | Admitting: Surgery

## 2020-08-26 LAB — BPAM FFP
Blood Product Expiration Date: 202203152359
Blood Product Expiration Date: 202203162359
Blood Product Expiration Date: 202203162359
Blood Product Expiration Date: 202203162359
ISSUE DATE / TIME: 202203111508
ISSUE DATE / TIME: 202203111551
ISSUE DATE / TIME: 202203112231
ISSUE DATE / TIME: 202203141213
Unit Type and Rh: 5100
Unit Type and Rh: 5100
Unit Type and Rh: 6200
Unit Type and Rh: 6200

## 2020-08-26 LAB — TYPE AND SCREEN
ABO/RH(D): O POS
Antibody Screen: NEGATIVE
Unit division: 0
Unit division: 0
Unit division: 0

## 2020-08-26 LAB — GLUCOSE, CAPILLARY
Glucose-Capillary: 173 mg/dL — ABNORMAL HIGH (ref 70–99)
Glucose-Capillary: 98 mg/dL (ref 70–99)
Glucose-Capillary: 136 mg/dL — ABNORMAL HIGH (ref 70–99)
Glucose-Capillary: 129 mg/dL — ABNORMAL HIGH (ref 70–99)

## 2020-08-26 LAB — PREPARE FRESH FROZEN PLASMA: Unit division: 0

## 2020-08-26 LAB — CBC
HCT: 25.2 % — ABNORMAL LOW (ref 39.0–52.0)
Hemoglobin: 8.2 g/dL — ABNORMAL LOW (ref 13.0–17.0)
MCH: 32.5 pg (ref 26.0–34.0)
MCHC: 32.5 g/dL (ref 30.0–36.0)
MCV: 100 fL (ref 80.0–100.0)
Platelets: 143 10*3/uL — ABNORMAL LOW (ref 150–400)
RBC: 2.52 MIL/uL — ABNORMAL LOW (ref 4.22–5.81)
RDW: 14.2 % (ref 11.5–15.5)
WBC: 14.7 10*3/uL — ABNORMAL HIGH (ref 4.0–10.5)
nRBC: 0 % (ref 0.0–0.2)

## 2020-08-26 LAB — BASIC METABOLIC PANEL
Anion gap: 6 (ref 5–15)
BUN: 28 mg/dL — ABNORMAL HIGH (ref 8–23)
CO2: 33 mmol/L — ABNORMAL HIGH (ref 22–32)
Calcium: 8.7 mg/dL — ABNORMAL LOW (ref 8.9–10.3)
Chloride: 95 mmol/L — ABNORMAL LOW (ref 98–111)
Creatinine, Ser: 1.09 mg/dL (ref 0.61–1.24)
GFR, Estimated: >60 mL/min (ref >60)
Glucose, Bld: 184 mg/dL — ABNORMAL HIGH (ref 70–99)
Potassium: 4.3 mmol/L (ref 3.5–5.1)
Sodium: 134 mmol/L — ABNORMAL LOW (ref 135–145)

## 2020-08-26 LAB — BPAM RBC
Blood Product Expiration Date: 202204042359
Blood Product Expiration Date: 202204042359
Blood Product Expiration Date: 202204052359
ISSUE DATE / TIME: 202203111810
ISSUE DATE / TIME: 202203112230
ISSUE DATE / TIME: 202203112230
Unit Type and Rh: 5100
Unit Type and Rh: 5100
Unit Type and Rh: 5100

## 2020-08-26 LAB — COOXEMETRY PANEL
Carboxyhemoglobin: 0.9 % (ref 0.5–1.5)
Methemoglobin: 0.6 % (ref 0.0–1.5)
O2 Saturation: 60.2 %
Total hemoglobin: 8.2 g/dL — ABNORMAL LOW (ref 12.0–16.0)

## 2020-08-26 MED ORDER — FUROSEMIDE 40 MG PO TABS
40.0000 mg | ORAL_TABLET | Freq: Every day | ORAL | Status: DC
  Administered 2020-08-26 – 2020-08-27 (×2): 40 mg via ORAL
  Filled 2020-08-26 (×2): qty 1

## 2020-08-26 MED ORDER — POTASSIUM CHLORIDE CRYS ER 10 MEQ PO TBCR
10.0000 meq | EXTENDED_RELEASE_TABLET | Freq: Every day | ORAL | Status: DC
  Administered 2020-08-26 – 2020-08-30 (×5): 10 meq via ORAL
  Filled 2020-08-26 (×5): qty 1

## 2020-08-26 MED FILL — Thrombin (Recombinant) For Soln 20000 Unit: CUTANEOUS | Qty: 1 | Status: AC

## 2020-08-26 NOTE — Progress Notes (Signed)
Pt complains states, "My heart feels like it is beating fast". Pt HR in bed is 95-100 (Afib). Given last BP @ 0545 is 116/85 (97). RN titrated Epi from to in hopes to allow HR to slow back down for patient comfort. If BP begins to drop, RN will titrate accordingly.

## 2020-08-26 NOTE — Addendum Note (Signed)
Addendum  created 08/26/20 0856 by Adair Laundry, CRNA   Order list changed, Pharmacy for encounter modified

## 2020-08-26 NOTE — Progress Notes (Signed)
TCTS  Subjective: Complains of chest wall pain   Objective: Vital signs in last 24 hours: Temp:  [97.6 F (36.4 C)-98.3 F (36.8 C)] 97.6 F (36.4 C) (03/14 0738) Pulse Rate:  [25-124] 45 (03/14 0600) Cardiac Rhythm: Atrial fibrillation (03/14 0400) Resp:  [7-26] 19 (03/14 0600) BP: (71-118)/(43-85) 103/70 (03/14 0600) SpO2:  [69 %-100 %] 100 % (03/14 0600) Weight:  [64.5 kg] 64.5 kg (03/14 0500)  Hemodynamic parameters for last 24 hours:    Intake/Output from previous day: 03/13 0701 - 03/14 0700 In: 1861.7 [P.O.:720; I.V.:1141.7] Out: 3280 [Urine:2825; Chest Tube:455] Intake/Output this shift: No intake/output data recorded.  General appearance: alert and cooperative Neurologic: intact Heart: regular rate and rhythm Lungs: clear to auscultation bilaterally Extremities: edema mild Wound: incision ok  Lab Results: Recent Labs    08/25/20 0405 08/26/20 0415  WBC 16.8* 14.7*  HGB 8.8* 8.2*  HCT 27.1* 25.2*  PLT 129* 143*   BMET:  Recent Labs    08/25/20 0405 08/26/20 0415  NA 135 134*  K 5.0 4.3  CL 101 95*  CO2 29 33*  GLUCOSE 104* 184*  BUN 24* 28*  CREATININE 1.17 1.09  CALCIUM 8.8* 8.7*    PT/INR:  Recent Labs    08/24/20 0115  LABPROT 16.2*  INR 1.4*   ABG    Component Value Date/Time   PHART 7.352 08/24/2020 0502   HCO3 24.3 08/24/2020 0502   TCO2 26 08/24/2020 0502   ACIDBASEDEF 1.0 08/24/2020 0502   O2SAT 60.2 08/26/2020 0415   CBG (last 3)  Recent Labs    08/25/20 1946 08/25/20 2338 08/26/20 0634  GLUCAP 137* 188* 173*   CXR: looks ok   Assessment/Plan:  POD 3 Hemodynamically stable but still on epi 1-2 mcg for BP support to keep MAP 70. Midodrine added yesterday and will wean epi as tolerated. No beta blocker at this time due to marginal BP.  Rhythm is sinus with intermittent AF that is rate controlled on IV amio. Will continue IV for now. May need anticoagulation if he continues to have some episodes.  Diuresed -1400  cc yesterday and wt is at preop but he was volume overloaded preop with large bilateral pleural effusions. Will continue Lasix 40 mg po daily.  Glucose under adequate control. Continue SSI. Control should be better when off epi.  IS, ambulation.  DC pleural tubes.  LOS: 7 days    Alleen Borne 08/26/2020

## 2020-08-26 NOTE — Progress Notes (Signed)
RN assisted patient with ambulation this morning. Pt was walking gine at first, then became unsteady on his feet. RN sat pt down in a chair safely and recycled a blood pressure. Pt BP was 71/43 (54). Pt was on of Epi at this time as well as Amio at 30mg /hr. RN titrated Epi up to 36mcg/min to obtain BP of 94/64 (73). RN assisted pt back to his room, and returned pt to the bed safely.

## 2020-08-26 NOTE — Progress Notes (Signed)
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  Ongoing  GOAL:   Patient will meet greater than or equal to 90% of their needs   Progressing  MONITOR:   PO intake,Supplement acceptance  REASON FOR ASSESSMENT:   Malnutrition Screening Tool    ASSESSMENT:   80 yo male admitted with CAD with MVD and ADHF. PMH includes BPH, HTN, HLD, glaucoma.   3/11- s/p CABG x3 3/12- s/p mediastinal exploration giving bleeding after CABG  Pt discussed during ICU rounds and with RN.   Intake progressing s/p surgery. Last four meal completions charted as 75-100%. Skipped breakfast this am but plans on eating lunch. Taking one Ensure daily. Will to increase BID to maximize kcal and protein.   Admission weight: 64.8 kg  Current weight: 64.5 kg   UOP: 2825 ml x 24 hrs Chest tubes: 455 ml x 24 hrs   Drips: epinephrine  Medications: colace, trinsicon, 40 mg lasix daily, SS novolog, 10 mEq KCl daily Labs: Na 134 (L) CBG 98-188  Diet Order:   Diet Order            Diet Carb Modified Fluid consistency: Thin; Room service appropriate? Yes  Diet effective now                 EDUCATION NEEDS:   Not appropriate for education at this time  Skin:  Skin Assessment: Skin Integrity Issues: Skin Integrity Issues:: Incisions Incisions: bilateral legs  Last BM:  3/9  Height:   Ht Readings from Last 1 Encounters:  08/19/20 5\' 11"  (1.803 m)    Weight:   Wt Readings from Last 1 Encounters:  08/26/20 64.5 kg    Ideal Body Weight:  78.2 kg  BMI:  Body mass index is 19.83 kg/m.  Estimated Nutritional  Needs:   Kcal:  2000-2200  Protein:  90-110 gm  Fluid:  1.8-2 L  08/28/20 RD, LDN Clinical Nutrition Pager listed in AMION

## 2020-08-26 NOTE — Progress Notes (Signed)
      [address]       [place] [address]             [phone]      POD # 3 CABG, reexploration  Sitting up in bed BP (!) 110/59   Pulse 67   Temp 97.9 F (36.6 C) (Oral)   Resp 16   Ht 5\' 11"  (1.803 m)   Wt 64.5 kg   SpO2 100%   BMI 19.83 kg/m  Off epi  Intake/Output Summary (Last 24 hours) at 08/26/2020 1807 Last data filed at 08/26/2020 1700 Gross per 24 hour  Intake 882.73 ml  Output 2577 ml  Net -1694.27 ml   CBG well controlled No PM labs  Steven C. 08/28/2020, MD [facility] [phone]

## 2020-08-27 ENCOUNTER — Inpatient Hospital Stay (HOSPITAL_COMMUNITY): Payer: Medicare Other

## 2020-08-27 LAB — CBC
HCT: 24.8 % — ABNORMAL LOW (ref 39.0–52.0)
Hemoglobin: 8.3 g/dL — ABNORMAL LOW (ref 13.0–17.0)
MCH: 33.2 pg (ref 26.0–34.0)
MCHC: 33.5 g/dL (ref 30.0–36.0)
MCV: 99.2 fL (ref 80.0–100.0)
Platelets: 163 10*3/uL (ref 150–400)
RBC: 2.5 MIL/uL — ABNORMAL LOW (ref 4.22–5.81)
RDW: 14.3 % (ref 11.5–15.5)
WBC: 11.6 10*3/uL — ABNORMAL HIGH (ref 4.0–10.5)
nRBC: 0 % (ref 0.0–0.2)

## 2020-08-27 LAB — GLUCOSE, CAPILLARY
Glucose-Capillary: 104 mg/dL — ABNORMAL HIGH (ref 70–99)
Glucose-Capillary: 121 mg/dL — ABNORMAL HIGH (ref 70–99)
Glucose-Capillary: 139 mg/dL — ABNORMAL HIGH (ref 70–99)
Glucose-Capillary: 135 mg/dL — ABNORMAL HIGH (ref 70–99)

## 2020-08-27 LAB — BASIC METABOLIC PANEL
Anion gap: 5 (ref 5–15)
BUN: 21 mg/dL (ref 8–23)
CO2: 36 mmol/L — ABNORMAL HIGH (ref 22–32)
Calcium: 8.8 mg/dL — ABNORMAL LOW (ref 8.9–10.3)
Chloride: 93 mmol/L — ABNORMAL LOW (ref 98–111)
Creatinine, Ser: 1.05 mg/dL (ref 0.61–1.24)
GFR, Estimated: >60 mL/min (ref >60)
Glucose, Bld: 133 mg/dL — ABNORMAL HIGH (ref 70–99)
Potassium: 4.1 mmol/L (ref 3.5–5.1)
Sodium: 134 mmol/L — ABNORMAL LOW (ref 135–145)

## 2020-08-27 LAB — MAGNESIUM: Magnesium: 1.7 mg/dL (ref 1.7–2.4)

## 2020-08-27 MED ORDER — ONDANSETRON HCL 4 MG PO TABS: 4.0000 mg | ORAL_TABLET | Freq: Four times a day (QID) | ORAL | Status: DC | PRN

## 2020-08-27 MED ORDER — PANTOPRAZOLE SODIUM 40 MG PO TBEC
40.0000 mg | DELAYED_RELEASE_TABLET | Freq: Every day | ORAL | Status: DC
  Administered 2020-08-28 – 2020-08-30 (×3): 40 mg via ORAL
  Filled 2020-08-27 (×4): qty 1

## 2020-08-27 MED ORDER — ~~LOC~~ CARDIAC SURGERY, PATIENT & FAMILY EDUCATION: Freq: Once | Status: DC

## 2020-08-27 MED ORDER — SODIUM CHLORIDE 0.9 % IV SOLN: 250.0000 mL | INTRAVENOUS | Status: DC | PRN

## 2020-08-27 MED ORDER — FUROSEMIDE 20 MG PO TABS
20.0000 mg | ORAL_TABLET | Freq: Every day | ORAL | Status: DC
  Administered 2020-08-28 – 2020-08-30 (×3): 20 mg via ORAL
  Filled 2020-08-27 (×3): qty 1

## 2020-08-27 MED ORDER — ACETAMINOPHEN 325 MG PO TABS: 650.0000 mg | ORAL_TABLET | Freq: Four times a day (QID) | ORAL | Status: DC | PRN

## 2020-08-27 MED ORDER — TRAMADOL HCL 50 MG PO TABS: 50.0000 mg | ORAL_TABLET | ORAL | Status: DC | PRN

## 2020-08-27 MED ORDER — INSULIN ASPART 100 UNIT/ML ~~LOC~~ SOLN
0.0000 [IU] | Freq: Three times a day (TID) | SUBCUTANEOUS | Status: DC
  Administered 2020-08-27: 2 [IU] via SUBCUTANEOUS

## 2020-08-27 MED ORDER — SENNOSIDES-DOCUSATE SODIUM 8.6-50 MG PO TABS
1.0000 | ORAL_TABLET | Freq: Two times a day (BID) | ORAL | Status: DC | PRN
  Administered 2020-08-29: 1 via ORAL
  Filled 2020-08-27: qty 1

## 2020-08-27 MED ORDER — AMIODARONE HCL 200 MG PO TABS
200.0000 mg | ORAL_TABLET | Freq: Two times a day (BID) | ORAL | Status: DC
  Administered 2020-08-27 – 2020-08-30 (×7): 200 mg via ORAL
  Filled 2020-08-27 (×7): qty 1

## 2020-08-27 MED ORDER — ASPIRIN EC 325 MG PO TBEC
325.0000 mg | DELAYED_RELEASE_TABLET | Freq: Every day | ORAL | Status: DC
  Administered 2020-08-27 – 2020-08-30 (×4): 325 mg via ORAL
  Filled 2020-08-27 (×4): qty 1

## 2020-08-27 MED ORDER — LACTULOSE 10 GM/15ML PO SOLN
20.0000 g | Freq: Once | ORAL | Status: AC
  Administered 2020-08-27: 20 g via ORAL
  Filled 2020-08-27: qty 30

## 2020-08-27 MED ORDER — SODIUM CHLORIDE 0.9% FLUSH
3.0000 mL | Freq: Two times a day (BID) | INTRAVENOUS | Status: DC
  Administered 2020-08-27 – 2020-08-29 (×4): 3 mL via INTRAVENOUS

## 2020-08-27 MED ORDER — OXYCODONE HCL 5 MG PO TABS: 5.0000 mg | ORAL_TABLET | ORAL | Status: DC | PRN

## 2020-08-27 MED ORDER — METOCLOPRAMIDE HCL 5 MG/ML IJ SOLN
10.0000 mg | Freq: Four times a day (QID) | INTRAMUSCULAR | Status: DC
  Administered 2020-08-27 – 2020-08-28 (×3): 10 mg via INTRAVENOUS
  Filled 2020-08-27 (×3): qty 2

## 2020-08-27 MED ORDER — ONDANSETRON HCL 4 MG/2ML IJ SOLN: 4.0000 mg | Freq: Four times a day (QID) | INTRAMUSCULAR | Status: DC | PRN

## 2020-08-27 MED ORDER — SODIUM CHLORIDE 0.9% FLUSH: 3.0000 mL | INTRAVENOUS | Status: DC | PRN

## 2020-08-27 MED FILL — Sodium Bicarbonate IV Soln 8.4%: INTRAVENOUS | Qty: 50 | Status: AC

## 2020-08-27 MED FILL — Electrolyte-R (PH 7.4) Solution: INTRAVENOUS | Qty: 4000 | Status: AC

## 2020-08-27 MED FILL — Mannitol IV Soln 20%: INTRAVENOUS | Qty: 500 | Status: AC

## 2020-08-27 MED FILL — Heparin Sodium (Porcine) Inj 1000 Unit/ML: INTRAMUSCULAR | Qty: 30 | Status: AC

## 2020-08-27 MED FILL — Potassium Chloride Inj 2 mEq/ML: INTRAVENOUS | Qty: 40 | Status: AC

## 2020-08-27 MED FILL — Heparin Sodium (Porcine) Inj 1000 Unit/ML: INTRAMUSCULAR | Qty: 20 | Status: AC

## 2020-08-27 MED FILL — Calcium Chloride Inj 10%: INTRAVENOUS | Qty: 10 | Status: AC

## 2020-08-27 MED FILL — Lidocaine HCl Local Soln Prefilled Syringe 100 MG/5ML (2%): INTRAMUSCULAR | Qty: 5 | Status: AC

## 2020-08-27 MED FILL — Sodium Chloride IV Soln 0.9%: INTRAVENOUS | Qty: 2000 | Status: AC

## 2020-08-27 MED FILL — Magnesium Sulfate Inj 50%: INTRAMUSCULAR | Qty: 10 | Status: AC

## 2020-08-27 NOTE — Plan of Care (Signed)
  Problem: Education: Goal: Knowledge of General Education information will improve Description Including pain rating scale, medication(s)/side effects and non-pharmacologic comfort measures Outcome: Progressing   

## 2020-08-27 NOTE — Progress Notes (Signed)
CARDIAC REHAB PHASE I   Offered to walk with pt. Pt c/o nausea today, ambulated earlier this morning. Encouraged continued ambulation and IS use as able. Will continue to follow.  Reynold Bowen, RN BSN 08/27/2020 2:20 PM

## 2020-08-27 NOTE — Progress Notes (Signed)
[facility] BRIEF SICU PROGRESS NOTE  4 Days Post-Op  S/P Procedure(s) (LRB): RE- EXPLORATION POST CABG (N/A)   Stable day Awaiting bed on 4E  Plan: Continue current plan  Purcell Nails, MD 08/27/2020 4:51 PM

## 2020-08-27 NOTE — Progress Notes (Addendum)
Patient states he is no longer has nauseate but requesting to wait an hour to take his pills.

## 2020-08-27 NOTE — Progress Notes (Signed)
Spoke with Cardio thoracic team.  Patient has not had a bowel movement in two days.  Patient is on stool softner but not a laxative.  New order received.

## 2020-08-27 NOTE — Progress Notes (Signed)
Paged Administrator, sports.   Patient nauseated.  Last dose of zofran 0942.  It is too early for another dose.  Awaiting new orders.

## 2020-08-27 NOTE — Progress Notes (Signed)
  Subjective: Still has some nausea. Otherwise ok and slept some  Objective: Vital signs in last 24 hours: Temp:  [97.6 F (36.4 C)-97.9 F (36.6 C)] 97.6 F (36.4 C) (03/15 0034) Pulse Rate:  [56-96] 75 (03/15 0600) Cardiac Rhythm: Sinus bradycardia;Normal sinus rhythm (03/15 0400) Resp:  [11-22] 16 (03/15 0400) BP: (76-124)/(52-99) 107/88 (03/15 0600) SpO2:  [62 %-100 %] 100 % (03/15 0600) Weight:  [63.5 kg] 63.5 kg (03/15 0600)  Hemodynamic parameters for last 24 hours:    Intake/Output from previous day: 03/14 0701 - 03/15 0700 In: 574.7 [I.V.:574.7] Out: 1697 [Urine:1495; Chest Tube:202] Intake/Output this shift: Total I/O In: 183.4 [I.V.:183.4] Out: 500 [Urine:500]  General appearance: alert and looks tired Neurologic: intact Heart: regular rate and rhythm Lungs: clear to auscultation bilaterally Abdomen: soft, non-tender; bowel sounds normal Extremities: no edema Wound: incision ok  Lab Results: Recent Labs    08/26/20 0415 08/27/20 0425  WBC 14.7* 11.6*  HGB 8.2* 8.3*  HCT 25.2* 24.8*  PLT 143* 163   BMET:  Recent Labs    08/26/20 0415 08/27/20 0425  NA 134* 134*  K 4.3 4.1  CL 95* 93*  CO2 33* 36*  GLUCOSE 184* 133*  BUN 28* 21  CREATININE 1.09 1.05  CALCIUM 8.7* 8.8*    PT/INR: No results for input(s): LABPROT, INR in the last 72 hours. ABG    Component Value Date/Time   PHART 7.352 08/24/2020 0502   HCO3 24.3 08/24/2020 0502   TCO2 26 08/24/2020 0502   ACIDBASEDEF 1.0 08/24/2020 0502   O2SAT 60.2 08/26/2020 0415   CBG (last 3)  Recent Labs    08/26/20 1458 08/26/20 2222 08/27/20 0650  GLUCAP 136* 129* 104*   CXR: ok Assessment/Plan:  POD 4 Hemodynamically stable in sinus rhythm on midodrine for BP support. Will decrease as tolerated. No BB or ACE I at this time. Preop EF 30%  Postop atrial fib now maintaining sinus on IV amio. Will switch to po and remove sleeve.  Nausea: could be related to amio. Will  observe.  Expected acute postop blood loss anemia: stable. Continue iron.  Transfer to 4E and continue IS, ambulation.  LOS: 8 days    Alleen Borne 08/27/2020

## 2020-08-27 NOTE — Progress Notes (Signed)
Patient actively vomiting.  Administered zofran 4mg  IV as per MD orders.

## 2020-08-28 ENCOUNTER — Inpatient Hospital Stay (HOSPITAL_COMMUNITY): Payer: Medicare Other

## 2020-08-28 LAB — GLUCOSE, CAPILLARY
Glucose-Capillary: 103 mg/dL — ABNORMAL HIGH (ref 70–99)
Glucose-Capillary: 113 mg/dL — ABNORMAL HIGH (ref 70–99)
Glucose-Capillary: 139 mg/dL — ABNORMAL HIGH (ref 70–99)

## 2020-08-28 MED ORDER — ADULT MULTIVITAMIN W/MINERALS CH: 1.0000 | ORAL_TABLET | Freq: Every day | ORAL | Status: AC

## 2020-08-28 MED ORDER — POLYETHYLENE GLYCOL 3350 17 G PO PACK: 17.0000 g | PACK | Freq: Every day | ORAL | Status: DC

## 2020-08-28 MED ORDER — ACETAMINOPHEN 325 MG PO TABS: 650.0000 mg | ORAL_TABLET | Freq: Four times a day (QID) | ORAL | Status: DC | PRN

## 2020-08-28 MED ORDER — METOCLOPRAMIDE HCL 5 MG/ML IJ SOLN
10.0000 mg | Freq: Four times a day (QID) | INTRAMUSCULAR | Status: AC
  Filled 2020-08-28: qty 2

## 2020-08-28 MED ORDER — MIDODRINE HCL 5 MG PO TABS
10.0000 mg | ORAL_TABLET | Freq: Two times a day (BID) | ORAL | Status: DC
  Administered 2020-08-28 – 2020-08-29 (×3): 10 mg via ORAL
  Filled 2020-08-28 (×3): qty 2

## 2020-08-28 MED ORDER — ASPIRIN 325 MG PO TBEC: 325.0000 mg | DELAYED_RELEASE_TABLET | Freq: Every day | ORAL | 0 refills | Status: DC

## 2020-08-28 MED ORDER — POLYETHYLENE GLYCOL 3350 17 G PO PACK
17.0000 g | PACK | Freq: Every day | ORAL | Status: DC
  Administered 2020-08-29: 17 g via ORAL
  Filled 2020-08-28 (×2): qty 1

## 2020-08-28 NOTE — Care Management Important Message (Signed)
Important Message  Patient Details  Name: Paul [name] MRN: [id number] Date of Birth: August 18, 1940   Medicare Important Message Given:  Yes     Renie Ora 08/28/2020, 10:25 AM

## 2020-08-28 NOTE — Progress Notes (Signed)
EPW removed, pt tolerated well.  Bedrest till 0930.  VSS. Will ctm

## 2020-08-28 NOTE — Progress Notes (Signed)
Mobility Specialist - Progress Note   08/28/20 1445  Mobility  Activity Ambulated in hall  Level of Assistance Standby assist, set-up cues, supervision of patient - no hands on  Assistive Device None  Distance Ambulated (ft) 470 ft  Mobility Response Tolerated well  Mobility performed by Mobility specialist  $Mobility charge 1 Mobility   Pre-mobility: 86 HR During mobility: 97 HR Post-mobility: 86 HR  Pt sitting up in recliner after walk, he was asx throughout.   Mamie Levers Mobility Specialist Mobility Specialist Phone: [phone]

## 2020-08-28 NOTE — Discharge Instructions (Signed)
Discharge Instructions:  1. You may shower, please wash incisions daily with soap and water and keep dry.  If you wish to cover wounds with dressing you may do so but please keep clean and change daily.  No tub baths or swimming until incisions have completely healed.  If your incisions become red or develop any drainage please call our office at [phone]  2. No Driving until cleared by Dr. Bartle's office and you are no longer using narcotic pain medications  3. Monitor your weight daily.. Please use the same scale and weigh at same time... If you gain 5-10 lbs in 48 hours with associated lower extremity swelling, please contact our office at [phone]  4. Fever of 101.5 for at least 24 hours with no source, please contact our office at [phone]  5. Activity- up as tolerated, please walk at least 3 times per day.  Avoid strenuous activity, no lifting, pushing, or pulling with your arms over 8-10 lbs for a minimum of 6 weeks  6. If any questions or concerns arise, please do not hesitate to contact our office at [phone]   Prediabetes Eating Plan Prediabetes is a condition that causes blood sugar (glucose) levels to be higher than normal. This increases the risk for developing type 2 diabetes (type 2 diabetes mellitus). Working with a health care provider or nutrition specialist (dietitian) to make diet and lifestyle changes can help prevent the onset of diabetes. These changes may help you:  Control your blood glucose levels.  Improve your cholesterol levels.  Manage your blood pressure. What are tips for following this plan? Reading food labels  Read food labels to check the amount of fat, salt (sodium), and sugar in prepackaged foods. Avoid foods that have: ? Saturated fats. ? Trans fats. ? Added sugars.  Avoid foods that have more than 300 milligrams (mg) of sodium per serving. Limit your sodium intake to less than 2,300 mg each day. Shopping  Avoid buying pre-made  and processed foods.  Avoid buying drinks with added sugar. Cooking  Cook with olive oil. Do not use butter, lard, or ghee.  Bake, broil, grill, steam, or boil foods. Avoid frying. Meal planning  Work with your dietitian to create an eating plan that is right for you. This may include tracking how many calories you take in each day. Use a food diary, notebook, or mobile application to track what you eat at each meal.  Consider following a Mediterranean diet. This includes: ? Eating several servings of fresh fruits and vegetables each day. ? Eating fish at least twice a week. ? Eating one serving each day of whole grains, beans, nuts, and seeds. ? Using olive oil instead of other fats. ? Limiting alcohol. ? Limiting red meat. ? Using nonfat or low-fat dairy products.  Consider following a plant-based diet. This includes dietary choices that focus on eating mostly vegetables and fruit, grains, beans, nuts, and seeds.  If you have high blood pressure, you may need to limit your sodium intake or follow a diet such as the DASH (Dietary Approaches to Stop Hypertension) eating plan. The DASH diet aims to lower high blood pressure.   Lifestyle  Set weight loss goals with help from your health care team. It is recommended that most people with prediabetes lose 7% of their body weight.  Exercise for at least 30 minutes 5 or more days a week.  Attend a support group or seek support from a mental health counselor.  Take over-the-counter and   prescription medicines only as told by your health care provider. What foods are recommended? Fruits Berries. Bananas. Apples. Oranges. Grapes. Papaya. Mango. Pomegranate. Kiwi. Grapefruit. Cherries. Vegetables Lettuce. Spinach. Peas. Beets. Cauliflower. Cabbage. Broccoli. Carrots. Tomatoes. Squash. Eggplant. Herbs. Peppers. Onions. Cucumbers. Brussels sprouts. Grains Whole grains, such as whole-wheat or whole-grain breads, crackers, cereals, and pasta.  Unsweetened oatmeal. Bulgur. Barley. Quinoa. Brown rice. Corn or whole-wheat flour tortillas or taco shells. Meats and other proteins Seafood. Poultry without skin. Lean cuts of pork and beef. Tofu. Eggs. Nuts. Beans. Dairy Low-fat or fat-free dairy products, such as yogurt, cottage cheese, and cheese. Beverages Water. Tea. Coffee. Sugar-free or diet soda. Seltzer water. Low-fat or nonfat milk. Milk alternatives, such as soy or almond milk. Fats and oils Olive oil. Canola oil. Sunflower oil. Grapeseed oil. Avocado. Walnuts. Sweets and desserts Sugar-free or low-fat pudding. Sugar-free or low-fat ice cream and other frozen treats. Seasonings and condiments Herbs. Sodium-free spices. Mustard. Relish. Low-salt, low-sugar ketchup. Low-salt, low-sugar barbecue sauce. Low-fat or fat-free mayonnaise. The items listed above may not be a complete list of recommended foods and beverages. Contact a dietitian for more information. What foods are not recommended? Fruits Fruits canned with syrup. Vegetables Canned vegetables. Frozen vegetables with butter or cream sauce. Grains Refined white flour and flour products, such as bread, pasta, snack foods, and cereals. Meats and other proteins Fatty cuts of meat. Poultry with skin. Breaded or fried meat. Processed meats. Dairy Full-fat yogurt, cheese, or milk. Beverages Sweetened drinks, such as iced tea and soda. Fats and oils Butter. Lard. Ghee. Sweets and desserts Baked goods, such as cake, cupcakes, pastries, cookies, and cheesecake. Seasonings and condiments Spice mixes with added salt. Ketchup. Barbecue sauce. Mayonnaise. The items listed above may not be a complete list of foods and beverages that are not recommended. Contact a dietitian for more information. Where to find more information  American Diabetes Association: www.diabetes.org Summary  You may need to make diet and lifestyle changes to help prevent the onset of diabetes. These  changes can help you control blood sugar, improve cholesterol levels, and manage blood pressure.  Set weight loss goals with help from your health care team. It is recommended that most people with prediabetes lose 7% of their body weight.  Consider following a Mediterranean diet. This includes eating plenty of fresh fruits and vegetables, whole grains, beans, nuts, seeds, fish, and low-fat dairy, and using olive oil instead of other fats. This information is not intended to replace advice given to you by your health care provider. Make sure you discuss any questions you have with your health care provider. Document Revised: 08/31/2019 Document Reviewed: 08/31/2019 Elsevier Patient Education  2021 Elsevier Inc.   

## 2020-08-28 NOTE — Progress Notes (Addendum)
      [address]       [place] [address]             [phone]      POD 5 CABG   Subjective: Patient eating breakfast this am. No nausea or abdominal pain this am. He has not had a bowel movement yet.  Objective: Vital signs in last 24 hours: Temp:  [97.5 F (36.4 C)-98.1 F (36.7 C)] 97.8 F (36.6 C) (03/16 0440) Pulse Rate:  [64-81] 72 (03/16 0600) Cardiac Rhythm: Normal sinus rhythm (03/16 0440) Resp:  [12-22] 17 (03/16 0600) BP: (96-116)/(48-90) 108/72 (03/16 0440) SpO2:  [90 %-100 %] 100 % (03/16 0600) Weight:  [62.4 kg] 62.4 kg (03/16 0500)  Pre op weight 65.1 kg Current Weight  08/28/20 62.4 kg      Intake/Output from previous day: 03/15 0701 - 03/16 0700 In: 240 [P.O.:240] Out: 775 [Urine:775]   Physical Exam:  Cardiovascular: RRR Pulmonary: Clear to auscultation bilaterally Abdomen: Soft, non tender, bowel sounds present. Extremities: No lower extremity edema. Wounds: Sternal, bilateral LE wounds are all clean and dry.  No erythema or signs of infection.  Lab Results: CBC: Recent Labs    08/26/20 0415 08/27/20 0425  WBC 14.7* 11.6*  HGB 8.2* 8.3*  HCT 25.2* 24.8*  PLT 143* 163   BMET:  Recent Labs    08/26/20 0415 08/27/20 0425  NA 134* 134*  K 4.3 4.1  CL 95* 93*  CO2 33* 36*  GLUCOSE 184* 133*  BUN 28* 21  CREATININE 1.09 1.05  CALCIUM 8.7* 8.8*    PT/INR:  Lab Results  Component Value Date   INR 1.4 (H) 08/24/2020   INR 1.6 (H) 08/23/2020   INR 1.1 08/19/2020   ABG:  INR: Will add last result for INR, ABG once components are confirmed Will add last 4 CBG results once components are confirmed  Assessment/Plan:  1. CV - Previous a fib. SR this am. On Amiodarone 200 mg bid and Midodrine 10 mg tid. Will decrease Midodrine to bid. 2.  Pulmonary - On room air. Check PA/LAT CXR in am. Encourage incentive spirometer. 3.  Expected post op acute blood loss anemia - H and H yesterday stable at 8.3 and  24.8. Continue Trinsicon 4. CBGs 139/135/103. Pre op HGA1C 6.2. 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 Will stop accu checks and SS PRN. Will provide nutritional information with discharge paperwork 5. GI-given Reglan yesterday. Nausea resolved. Despite Lactulose, still no bowel movement 6. Remove EPW 7. Home 1-2 days  Lelon Huh [facility] 08/28/2020,7:13 AM   Chart reviewed, patient examined, agree with above. He is making progress. Still on midodrine to support BP. Will probably send home on lower dose and see how his BP does. He says his nausea is resolved. He says he has had a BM since surgery, just not in the past two days.

## 2020-08-28 NOTE — Progress Notes (Signed)
CARDIAC REHAB PHASE I   PRE:  Rate/Rhythm: 71 SR  BP:  Supine:   Sitting: 101/61  Standing:    SaO2: 94%RA  MODE:  Ambulation: 470 ft   POST:  Rate/Rhythm: 96 SR  BP:  Supine:   Sitting: 110/64  Standing:    SaO2: 99%RA 1240-1300 Pt walked 470 ft on RA with hand held asst with steady gait. Stopped once to rest. To recliner after walk and set up lunch. Second walk today. Call bell in reach.   Luetta Nutting, RN BSN  08/28/2020 12:58 PM

## 2020-08-29 MED ORDER — MIDODRINE HCL 5 MG PO TABS
5.0000 mg | ORAL_TABLET | Freq: Two times a day (BID) | ORAL | Status: DC
  Administered 2020-08-29 – 2020-08-30 (×2): 5 mg via ORAL
  Filled 2020-08-29 (×2): qty 1

## 2020-08-29 NOTE — Progress Notes (Signed)
Mobility Specialist: Progress Note   08/29/20 1452  Mobility  Activity Ambulated in hall  Level of Assistance Contact guard assist, steadying assist  Assistive Device None  Distance Ambulated (ft) 470 ft  Mobility Response Tolerated well  Mobility performed by Mobility specialist  Bed Position Chair  $Mobility charge 1 Mobility   Pre-Mobility: 87 HR Post-Mobility: 93 HR, 107/63 BP  Pt asx during ambulation. Pt back to chair after walk per request.   Cristal Deer [place] Mobility Specialist Mobility Specialist Phone: [phone]

## 2020-08-29 NOTE — Evaluation (Signed)
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 Pt underwent CABG x 3 on 3/11. Pt underwent re exploration of CABG site for bleeding on 3/12. PMH - BPH ,HTN  Clinical Impression  Pt doing well with mobility. Pt able to negotiate flight of stairs with min guard assist. Pt should be able to go up/down stairs at daughter's house to access the bedroom. Recommend daughter accompany him up/down the stairs initially. He should be able to do this several times a day. When he returns home his laundry room is in the basement. Recommend he have someone do his laundry initially since don't feel he should go up/down stairs carrying laundry. Pt verbalizes understanding of recommendations. No further PT needed.     Follow Up Recommendations No PT follow up    Equipment Recommendations  None recommended by PT    Recommendations for Other Services       Precautions / Restrictions Precautions Precautions: Sternal      Mobility  Bed Mobility               General bed mobility comments: Pt up in chair    Transfers Overall transfer level: Modified independent Equipment used: None                Ambulation/Gait Ambulation/Gait assistance: Modified independent (Device/Increase time) Gait Distance (Feet): 350 Feet Assistive device: None Gait Pattern/deviations: Step-through pattern;Decreased stride length;Drifts right/left Gait velocity: decr Gait velocity interpretation: 1.31 - 2.62 ft/sec, indicative of limited community ambulator General Gait Details: Pt amb with RLE externally rotated and toe out  Stairs Stairs: Yes Stairs assistance: Min guard Stair Management: One rail Right;One rail Left;Step to pattern;Forwards Number of Stairs: 12 General stair comments: Assist for safety  Wheelchair  Mobility    Modified Rankin (Stroke Patients Only)       Balance Overall balance assessment: Mild deficits observed, not formally tested                                           Pertinent Vitals/Pain Pain Assessment: No/denies pain    Home Living Family/patient expects to be discharged to:: Private residence Living Arrangements: Alone (Pt will be staying at daughter's house initially) Available Help at Discharge: Family;Available 24 hours/day Type of Home: House       Home Layout: Two level;Bed/bath upstairs        Prior Function Level of Independence: Independent               Hand Dominance        Extremity/Trunk Assessment   Upper Extremity Assessment Upper Extremity Assessment: Overall WFL for tasks assessed    Lower Extremity Assessment Lower Extremity Assessment: Generalized weakness       Communication   Communication: No difficulties  Cognition Arousal/Alertness: Awake/alert Behavior During Therapy: WFL for tasks assessed/performed Overall Cognitive Status: Within Functional Limits for tasks assessed                                        General Comments      Exercises     Assessment/Plan    PT Assessment Patent  does not need any further PT services  PT Problem List         PT Treatment Interventions      PT Goals (Current goals can be found in the Care Plan section)  Acute Rehab PT Goals PT Goal Formulation: All assessment and education complete, DC therapy    Frequency     Barriers to discharge        Co-evaluation               AM-PAC PT "6 Clicks" Mobility  Outcome Measure Help needed turning from your back to your side while in a flat bed without using bedrails?: None Help needed moving from lying on your back to sitting on the side of a flat bed without using bedrails?: None Help needed moving to and from a bed to a chair (including a wheelchair)?: None Help needed  standing up from a chair using your arms (e.g., wheelchair or bedside chair)?: None Help needed to walk in hospital room?: None Help needed climbing 3-5 steps with a railing? : A Little 6 Click Score: 23    End of Session Equipment Utilized During Treatment: Gait belt Activity Tolerance: Patient tolerated treatment well Patient left: in chair;with call bell/phone within reach Nurse Communication: Mobility status PT Visit Diagnosis: Other abnormalities of gait and mobility (R26.89)    Time: 0539-7673 PT Time Calculation (min) (ACUTE ONLY): 14 min   Charges:   PT Evaluation $PT Eval Low Complexity: 1 Low          [facility] PT Acute Rehabilitation Services Pager [phone] Office [phone]   Angelina Ok [facility] 08/29/2020, 5:02 PM

## 2020-08-29 NOTE — Progress Notes (Addendum)
      [address]       [place] [address]             [phone]       POST OP DAY 6 s/p CABG x 3  Subjective: Patient finishing breakfast this am. He has no specific complaint.  Objective: Vital signs in last 24 hours: Temp:  [97.5 F (36.4 C)-98.3 F (36.8 C)] 97.5 F (36.4 C) (03/17 0612) Pulse Rate:  [79-95] 81 (03/17 0612) Cardiac Rhythm: Normal sinus rhythm (03/16 2300) Resp:  [13-20] 19 (03/17 0612) BP: (97-111)/(64-94) 107/94 (03/17 0612) SpO2:  [92 %-100 %] 92 % (03/17 0612) Weight:  [62.8 kg] 62.8 kg (03/17 0500)  Pre op weight 65.1 kg Current Weight  08/29/20 62.8 kg      Intake/Output from previous day: 03/16 0701 - 03/17 0700 In: 960 [P.O.:960] Out: 550 [Urine:550]   Physical Exam:  Cardiovascular: RRR Pulmonary: Clear to auscultation bilaterally Abdomen: Soft, non tender, bowel sounds present. Extremities: No lower extremity edema. Wounds: Sternal, bilateral LE wounds are all clean and dry.  No erythema or signs of infection.  Lab Results: CBC: Recent Labs    08/27/20 0425  WBC 11.6*  HGB 8.3*  HCT 24.8*  PLT 163   BMET:  Recent Labs    08/27/20 0425  NA 134*  K 4.1  CL 93*  CO2 36*  GLUCOSE 133*  BUN 21  CREATININE 1.05  CALCIUM 8.8*    PT/INR:  Lab Results  Component Value Date   INR 1.4 (H) 08/24/2020   INR 1.6 (H) 08/23/2020   INR 1.1 08/19/2020   ABG:  INR: Will add last result for INR, ABG once components are confirmed Will add last 4 CBG results once components are confirmed  Assessment/Plan:  1. CV - Previous a fib. Continues to maintain SR. On Amiodarone 200 mg bid and Midodrine 10 mg bid.  2.  Pulmonary - On room air.  PA/LAT CXR done yesterday showed small,stable left apical pneumothorax and small b/l pleural effusions. Encourage incentive spirometer. 3.  Expected post op acute blood loss anemia - Last H and H stable at 8.3 and 24.8. Continue Trinsicon 4. Discharge in am  Lelon Huh  [facility] 08/29/2020,7:27 AM    Chart reviewed, patient examined, agree with above. He looks good this am. BP has been stable. Will decrease Midodrine to 5 bid and plan to send home on this. I will consider stopping it when I see him back in the office. Wt up slightly this am and still has some ankle and pedal edema. Will continue Lasix 20 daily today and reassess prior to DC to see if he needs higher dose. Plan home in am.

## 2020-08-29 NOTE — Plan of Care (Signed)
  Problem: Clinical Measurements: Goal: Will remain free from infection Outcome: Progressing Goal: Diagnostic test results will improve Outcome: Progressing Goal: Respiratory complications will improve Outcome: Progressing Goal: Cardiovascular complication will be avoided Outcome: Progressing   

## 2020-08-29 NOTE — Progress Notes (Signed)
CARDIAC REHAB PHASE I   PRE:  Rate/Rhythm: 80 SR  BP:  Supine:   Sitting: 102/60  Standing:    SaO2: 96%RA  MODE:  Ambulation: 470 ft   POST:  Rate/Rhythm: 95 SR  BP:  Supine:   Sitting: 105/84  Standing:    SaO2: 94%RA 0944-1005 Pt walked 470 ft on RA with hand held asst at times. Pt denied unsteadiness. Right foot extends outward which pt stated is normal for him. Did not feel up to increasing distance this morning. Reinforced sternal precautions as used hands slightly to stand. To recliner with call bell.   Luetta Nutting, RN BSN  08/29/2020 10:02 AM

## 2020-08-30 MED ORDER — AMIODARONE HCL 200 MG PO TABS: 200.0000 mg | ORAL_TABLET | Freq: Two times a day (BID) | ORAL | 1 refills | Status: DC

## 2020-08-30 MED ORDER — MIDODRINE HCL 5 MG PO TABS: 5.0000 mg | ORAL_TABLET | Freq: Two times a day (BID) | ORAL | 1 refills | Status: DC

## 2020-08-30 MED ORDER — POTASSIUM CHLORIDE CRYS ER 10 MEQ PO TBCR: 10.0000 meq | EXTENDED_RELEASE_TABLET | Freq: Every day | ORAL | 0 refills | Status: DC

## 2020-08-30 MED ORDER — TRAMADOL HCL 50 MG PO TABS: 50.0000 mg | ORAL_TABLET | ORAL | 0 refills | Status: DC | PRN

## 2020-08-30 MED ORDER — TRAMADOL HCL 50 MG PO TABS: 50.0000 mg | ORAL_TABLET | Freq: Four times a day (QID) | ORAL | 0 refills | Status: DC | PRN

## 2020-08-30 MED ORDER — FUROSEMIDE 20 MG PO TABS: 20.0000 mg | ORAL_TABLET | Freq: Every day | ORAL | 0 refills | Status: DC

## 2020-08-30 MED ORDER — FERROUS SULFATE 325 (65 FE) MG PO TABS: 325.0000 mg | ORAL_TABLET | Freq: Every day | ORAL | 0 refills | Status: DC

## 2020-08-30 MED ORDER — POTASSIUM CHLORIDE ER 10 MEQ PO TBCR
10.0000 meq | EXTENDED_RELEASE_TABLET | Freq: Every day | ORAL | 0 refills | Status: DC
Start: 2020-08-30 — End: 2020-08-30

## 2020-08-30 NOTE — Progress Notes (Addendum)
      [address]       [place] [address]             [phone]       POST OP DAY 7 s/p CABG x 3  Subjective: Patient sitting in the chair without complaints this am. He hopes to go to his daughter's house today  Objective: Vital signs in last 24 hours: Temp:  [97.9 F (36.6 C)-98.5 F (36.9 C)] 97.9 F (36.6 C) (03/18 0520) Pulse Rate:  [55-78] 78 (03/17 1928) Cardiac Rhythm: Normal sinus rhythm (03/18 0520) Resp:  [16-20] 16 (03/18 0520) BP: (95-118)/(60-84) 95/61 (03/18 0520) SpO2:  [95 %-100 %] 96 % (03/18 0520) Weight:  [62.8 kg] 62.8 kg (03/18 0520)  Pre op weight 65.1 kg Current Weight  08/30/20 62.8 kg      Intake/Output from previous day: 03/17 0701 - 03/18 0700 In: 360 [P.O.:360] Out: -    Physical Exam:  Cardiovascular: RRR Pulmonary: Clear to auscultation bilaterally Abdomen: Soft, non tender, bowel sounds present. Extremities: Ankle edema Wounds: Sternal, bilateral LE wounds are all clean and dry.  No erythema or signs of infection.  Lab Results: CBC: No results for input(s): WBC, HGB, HCT, PLT in the last 72 hours. BMET:  No results for input(s): NA, K, CL, CO2, GLUCOSE, BUN, CREATININE, CALCIUM in the last 72 hours.  PT/INR:  Lab Results  Component Value Date   INR 1.4 (H) 08/24/2020   INR 1.6 (H) 08/23/2020   INR 1.1 08/19/2020   ABG:  INR: Will add last result for INR, ABG once components are confirmed Will add last 4 CBG results once components are confirmed  Assessment/Plan:  1. CV - Previous a fib. Continues to maintain SR. On Amiodarone 200 mg bid and Midodrine 5 mg bid. SBP in the  90's but asymptomatic. As discussed with Dr. Laneta Simmers, will decrease Amiodarone and continue with current dose of Midodrine. 2.  Pulmonary - On room air.  Encourage incentive spirometer. 3.  Expected post op acute blood loss anemia - Last H and H stable at 8.3 and 24.8. Continue Trinsicon 4. Mild volume overload-on Lasix 20 mg  daily and will continue as outpatient 5. Discharge   Lelon Huh [facility] 08/30/2020,7:04 AM     Chart reviewed, patient examined, agree with above. He feels well. Still has mild ankle edema. Will send home on lasix 20 mg and instructed he and daughter to monitor wt at home and let us know if it is increasing or if his edema is increasing. Continue current midodrine and plan to wean as outpt when I see him back.

## 2020-08-30 NOTE — Progress Notes (Signed)
Mobility Specialist: Progress Note   08/30/20 1138  Mobility  Activity Ambulated in hall  Level of Assistance Independent  Assistive Device None  Distance Ambulated (ft) 470 ft  Mobility Response Tolerated well  Mobility performed by Mobility specialist  Bed Position Chair  $Mobility charge 1 Mobility   Post-Mobility: 87 HR  Pt asx during ambulation. Pt back to chair after walk and is eager for discharge.   [facility] Day Mobility Specialist Mobility Specialist Phone: [phone]

## 2020-08-30 NOTE — Progress Notes (Signed)
[id number] Education completed with pt and daughter who voiced understanding. Reviewed sternal precautions and staying in the tube. Encouraged IS and walking instructions given for ex. Gave heart healthy diet and encouraged low carb food choices with A1C 6.2. Discussed CRP 2 and referral letter will be sent to [facility]. Understanding voiced of ed. Luetta Nutting RN BSN 08/30/2020 9:18 AM

## 2020-08-30 NOTE — TOC Initial Note (Signed)
Transition of Care [facility]) - Initial/Assessment Note    Patient Details  Name: Garreth Burnsworth MRN: [id number] Date of Birth: 10/01/1940  Transition of Care [facility]) CM/SW Contact:    Gala Lewandowsky, RN Phone Number: 08/30/2020, 12:11 PM  Clinical Narrative:  Plan will be transition home today. Case Manager ordered bedside commode for the patient via [employer]. Daughter [facility] Case Manager reached out to the provider and pharmacies changed. Family will pick up the medications and transport the patient home via private vehicle. No home health services identified at this time.                Expected Discharge Plan: Home/Self Care Barriers to Discharge: No Barriers Identified   Patient Goals and CMS Choice Patient states their goals for this hospitalization and ongoing recovery are:: to go to his daughters home   Choice offered to / list presented to : NA  Expected Discharge Plan and Services Expected Discharge Plan: Home/Self Care In-house Referral: NA Discharge Planning Services: CM Consult Post Acute Care Choice: Durable Medical Equipment Living arrangements for the past 2 months: Single Family Home Expected Discharge Date: 08/30/20               DME Arranged: Bedside commode   Date DME Agency Contacted: 08/30/20 Time DME Agency Contacted: 1019 Representative spoke with at DME Agency: Velna Hatchet HH Arranged: NA (did not need hh services)    Prior Living Arrangements/Services Living arrangements for the past 2 months: Single Family Home Lives with:: Self,Adult Children Patient language and need for interpreter reviewed:: Yes Do you feel safe going back to the place where you live?: Yes      Need for Family Participation in Patient Care: Yes (Comment) Care giver support system in place?: Yes (comment)   Criminal Activity/Legal Involvement Pertinent to Current Situation/Hospitalization: No - Comment as needed  Activities of  Daily Living Home Assistive Devices/Equipment: Eyeglasses ADL Screening (condition at time of admission) Patient's cognitive ability adequate to safely complete daily activities?: Yes Is the patient deaf or have difficulty hearing?: No Does the patient have difficulty seeing, even when wearing glasses/contacts?: No Does the patient have difficulty concentrating, remembering, or making decisions?: No Patient able to express need for assistance with ADLs?: No Does the patient have difficulty dressing or bathing?: No Independently performs ADLs?: Yes (appropriate for developmental age) Does the patient have difficulty walking or climbing stairs?: Yes (becomes short of breath) Weakness of Legs: None Weakness of Arms/Hands: None  Permission Sought/Granted Permission sought to share information with : Family Dealer granted to share info w AGENCY: [employer]        Emotional Assessment Appearance:: Appears stated age Attitude/Demeanor/Rapport: Engaged Affect (typically observed): Appropriate Orientation: : Oriented to Situation,Oriented to  Time,Oriented to Place,Oriented to Self Alcohol / Substance Use: Not Applicable Psych Involvement: No (comment)  Admission diagnosis:  CAD (coronary artery disease), native coronary artery [I25.10] Coronary artery disease [I25.10] Patient Active Problem List   Diagnosis Date Noted  . S/P CABG x 3 08/23/2020  . Coronary artery disease 08/23/2020  . Malnutrition of moderate degree 08/23/2020  . CAD (coronary artery disease), native coronary artery 08/19/2020   PCP:  Patient, No Pcp Per Pharmacy:   [id number] [place] [place] [facility] [address] [facility] [address] [place] [address] Phone: [phone] Fax: [phone]  [phone]  [place], [place] - [address] [place] [address] Phone: [phone] Fax:  [phone]  Readmission Risk Interventions No flowsheet data found.

## 2020-08-31 ENCOUNTER — Other Ambulatory Visit: Payer: Self-pay | Admitting: Physician Assistant

## 2020-08-31 MED ORDER — ATORVASTATIN CALCIUM 10 MG PO TABS: 10.0000 mg | ORAL_TABLET | Freq: Every day | ORAL | 1 refills | Status: DC

## 2020-08-31 MED ORDER — FINASTERIDE 5 MG PO TABS
5.0000 mg | ORAL_TABLET | Freq: Every day | ORAL | 1 refills | Status: AC
Start: 2020-08-31 — End: ?

## 2020-09-01 ENCOUNTER — Emergency Department (HOSPITAL_COMMUNITY): Payer: Medicare Other

## 2020-09-01 ENCOUNTER — Other Ambulatory Visit: Payer: Self-pay

## 2020-09-01 ENCOUNTER — Emergency Department (HOSPITAL_COMMUNITY)
Admission: EM | Admit: 2020-09-01 | Discharge: 2020-09-02 | Disposition: A | Discharge status: Home / Self Care | Payer: Medicare Other | Attending: Emergency Medicine | Admitting: Emergency Medicine

## 2020-09-01 DIAGNOSIS — Z7982 Long term (current) use of aspirin: Secondary | ICD-10-CM | POA: Diagnosis not present

## 2020-09-01 DIAGNOSIS — R1030 Lower abdominal pain, unspecified: Secondary | ICD-10-CM | POA: Diagnosis not present

## 2020-09-01 DIAGNOSIS — R1110 Vomiting, unspecified: Secondary | ICD-10-CM | POA: Diagnosis not present

## 2020-09-01 DIAGNOSIS — K5900 Constipation, unspecified: Secondary | ICD-10-CM | POA: Insufficient documentation

## 2020-09-01 DIAGNOSIS — Z951 Presence of aortocoronary bypass graft: Secondary | ICD-10-CM | POA: Diagnosis not present

## 2020-09-01 DIAGNOSIS — I2510 Atherosclerotic heart disease of native coronary artery without angina pectoris: Secondary | ICD-10-CM | POA: Insufficient documentation

## 2020-09-01 LAB — COMPREHENSIVE METABOLIC PANEL
ALT: 14 U/L (ref 0–44)
AST: 17 U/L (ref 15–41)
Albumin: 3.2 g/dL — ABNORMAL LOW (ref 3.5–5.0)
Alkaline Phosphatase: 61 U/L (ref 38–126)
Anion gap: 10 (ref 5–15)
BUN: 26 mg/dL — ABNORMAL HIGH (ref 8–23)
CO2: 30 mmol/L (ref 22–32)
Calcium: 9 mg/dL (ref 8.9–10.3)
Chloride: 94 mmol/L — ABNORMAL LOW (ref 98–111)
Creatinine, Ser: 1.38 mg/dL — ABNORMAL HIGH (ref 0.61–1.24)
GFR, Estimated: 52 mL/min — ABNORMAL LOW (ref >60)
Glucose, Bld: 141 mg/dL — ABNORMAL HIGH (ref 70–99)
Potassium: 4.6 mmol/L (ref 3.5–5.1)
Sodium: 134 mmol/L — ABNORMAL LOW (ref 135–145)
Total Bilirubin: 1.3 mg/dL — ABNORMAL HIGH (ref 0.3–1.2)
Total Protein: 6.7 g/dL (ref 6.5–8.1)

## 2020-09-01 LAB — CBC WITH DIFFERENTIAL/PLATELET
Abs Immature Granulocytes: 0.1 10*3/uL — ABNORMAL HIGH (ref 0.00–0.07)
Basophils Absolute: 0.1 10*3/uL (ref 0.0–0.1)
Basophils Relative: 0 %
Eosinophils Absolute: 0.2 10*3/uL (ref 0.0–0.5)
Eosinophils Relative: 1 %
HCT: 32.2 % — ABNORMAL LOW (ref 39.0–52.0)
Hemoglobin: 10 g/dL — ABNORMAL LOW (ref 13.0–17.0)
Immature Granulocytes: 1 %
Lymphocytes Relative: 14 %
Lymphs Abs: 2.1 10*3/uL (ref 0.7–4.0)
MCH: 32.4 pg (ref 26.0–34.0)
MCHC: 31.1 g/dL (ref 30.0–36.0)
MCV: 104.2 fL — ABNORMAL HIGH (ref 80.0–100.0)
Monocytes Absolute: 1.1 10*3/uL — ABNORMAL HIGH (ref 0.1–1.0)
Monocytes Relative: 7 %
Neutro Abs: 11.3 10*3/uL — ABNORMAL HIGH (ref 1.7–7.7)
Neutrophils Relative %: 77 %
Platelets: 412 10*3/uL — ABNORMAL HIGH (ref 150–400)
RBC: 3.09 MIL/uL — ABNORMAL LOW (ref 4.22–5.81)
RDW: 15.9 % — ABNORMAL HIGH (ref 11.5–15.5)
WBC: 14.8 10*3/uL — ABNORMAL HIGH (ref 4.0–10.5)
nRBC: 0 % (ref 0.0–0.2)

## 2020-09-01 MED ORDER — LACTULOSE 10 GM/15ML PO SOLN
30.0000 g | Freq: Once | ORAL | Status: AC
  Administered 2020-09-02: 30 g via ORAL
  Filled 2020-09-01: qty 45

## 2020-09-01 MED ORDER — MINERAL OIL RE ENEM
1.0000 | ENEMA | Freq: Once | RECTAL | Status: AC
  Administered 2020-09-02: 1 via RECTAL
  Filled 2020-09-01: qty 1

## 2020-09-01 NOTE — ED Triage Notes (Signed)
Pt reports has been constipated since yesterday and tried suppositories with no relief. Pt states recently had heart surgery triple bypass last week. Pt denies any chest pain at this time

## 2020-09-01 NOTE — ED Provider Notes (Signed)
[facility] EMERGENCY DEPARTMENT Paul [name] Note   CSN: [id number] Arrival date & time: 09/01/20  2119     History Chief Complaint  Patient presents with  . Constipation  . Code STEMI    Paul [name] is a 80 y.o. male.  Patient is a 80 year old male with a history of hypertension, CAD, recent CABG last week who presents with constipation.  Patient reports that he was discharged home from the CT surgery service on Friday.  When he got home he progressively had worsening abdominal cramping with no bowel movements.  Patient reports he feels like he has to go however is unable to do so.  He reports abdominal cramping in the lower abdomen.  He reports one episode of vomiting this afternoon.  It was nonbloody and nonbilious.  Patient denies any chest pain and shortness of breath.  He reports that he believes his last bowel movement was about 6 days ago while in the hospital.  Patient has been taking multiple medications and being less active than normal.  He has tried multiple times to go to the bathroom.  He believes that he is straining and he is unable to go.  He states that he feels like there is something there to come out but will not.  Denies any dysuria or hematuria.        Past Medical History:  Diagnosis Date  . Glaucoma   . High blood pressure   . High cholesterol     Patient Active Problem List   Diagnosis Date Noted  . S/P CABG x 3 08/23/2020  . Coronary artery disease 08/23/2020  . Malnutrition of moderate degree 08/23/2020  . CAD (coronary artery disease), native coronary artery 08/19/2020    Past Surgical History:  Procedure Laterality Date  . CARDIAC CATHETERIZATION  08/19/2020  . CHEST EXPLORATION N/A 08/23/2020   Procedure: RE- EXPLORATION POST CABG;  Surgeon: Alleen Borne, MD;  Location: [facility] OR;  Service: Thoracic;  Laterality: N/A;  . CORONARY ARTERY BYPASS GRAFT N/A 08/23/2020   Procedure: CORONARY ARTERY BYPASS GRAFTING (CABG) X   THREE ,  LEFT INTERNAL MAMMARY HARVEST, RIGHT LEG ENDOSCOPIC SAPHENOUS VEIN HARVESTING, LEFT ENDOSCOPIC SAPHENOUS VEIN HARVEST;  Surgeon: Alleen Borne, MD;  Location: [facility] OR;  Service: Open Heart Surgery;  Laterality: N/A;  . TEE WITHOUT CARDIOVERSION N/A 08/23/2020   Procedure: TRANSESOPHAGEAL ECHOCARDIOGRAM (TEE);  Surgeon: Alleen Borne, MD;  Location: [facility] OR;  Service: Open Heart Surgery;  Laterality: N/A;  . WRIST FRACTURE SURGERY Right    "years ago"       No family history on file.  Social History   Tobacco Use  . Smoking status: Never Smoker  . Smokeless tobacco: Never Used  Substance Use Topics  . Alcohol use: Not Currently  . Drug use: Never    Home Medications Prior to Admission medications   Medication Sig Start Date End Date Taking? Authorizing Jazion Atteberry  acetaminophen (TYLENOL) 325 MG tablet Take 2 tablets (650 mg total) by mouth every 6 (six) hours as needed for mild pain. 08/28/20   Ardelle Balls, PA-C  amiodarone (PACERONE) 200 MG tablet Take 1 tablet (200 mg total) by mouth 2 (two) times daily. For 2 days then take 200 mg daily thereafter 08/30/20   Ardelle Balls, PA-C  aspirin EC 325 MG EC tablet Take 1 tablet (325 mg total) by mouth daily. 08/28/20   Ardelle Balls, PA-C  atorvastatin (LIPITOR) 10 MG tablet Take 1 tablet (10  mg total) by mouth daily. 08/31/20   Ardelle Balls, PA-C  ferrous sulfate 325 (65 FE) MG tablet Take 1 tablet (325 mg total) by mouth daily. For one month then stop. If develops constipation, may take laxative or stop sooner 08/30/20 08/30/21  Ardelle Balls, PA-C  finasteride (PROSCAR) 5 MG tablet Take 1 tablet (5 mg total) by mouth daily. 08/31/20   Ardelle Balls, PA-C  furosemide (LASIX) 20 MG tablet Take 1 tablet (20 mg total) by mouth daily. 08/30/20   Ardelle Balls, PA-C  latanoprost (XALATAN) 0.005 % ophthalmic solution Place 1 drop into both eyes at bedtime. 05/31/20   Nyra Anspaugh, Historical, MD   midodrine (PROAMATINE) 5 MG tablet Take 1 tablet (5 mg total) by mouth 2 (two) times daily with a meal. 08/30/20   Ardelle Balls, PA-C  Multiple Vitamin (MULTIVITAMIN WITH MINERALS) TABS tablet Take 1 tablet by mouth daily. 08/28/20   Ardelle Balls, PA-C  potassium chloride (KLOR-CON) 10 MEQ tablet Take 1 tablet (10 mEq total) by mouth daily. 08/31/20   Ardelle Balls, PA-C  traMADol (ULTRAM) 50 MG tablet Take 1 tablet (50 mg total) by mouth every 6 (six) hours as needed for severe pain. 08/30/20   Ardelle Balls, PA-C    Allergies    Patient has no known allergies.  Review of Systems   Review of Systems  Constitutional: Negative for chills and fever.  HENT: Negative for ear pain and sore throat.   Eyes: Negative for pain and visual disturbance.  Respiratory: Negative for cough and shortness of breath.   Cardiovascular: Negative for chest pain and palpitations.  Gastrointestinal: Positive for abdominal pain, constipation and vomiting.  Genitourinary: Negative for dysuria and hematuria.  Musculoskeletal: Negative for arthralgias and back pain.  Skin: Negative for color change and rash.  Neurological: Negative for seizures and syncope.  All other systems reviewed and are negative.   Physical Exam Updated Vital Signs BP 110/67   Pulse 77   Temp 98 F (36.7 C) (Oral)   Resp 20   SpO2 99%   Physical Exam Vitals and nursing note reviewed.  Constitutional:      Appearance: He is well-developed.  HENT:     Head: Normocephalic and atraumatic.  Eyes:     Conjunctiva/sclera: Conjunctivae normal.  Cardiovascular:     Rate and Rhythm: Normal rate and regular rhythm.     Pulses: Normal pulses.     Comments: CABG surgical site healing well no erythema or signs of infection Pulmonary:     Effort: Pulmonary effort is normal.     Breath sounds: Normal breath sounds.  Abdominal:     General: There is no distension.     Palpations: Abdomen is soft.      Tenderness: There is no abdominal tenderness. There is no guarding or rebound.  Musculoskeletal:     Cervical back: Neck supple.  Skin:    General: Skin is warm and dry.  Neurological:     General: No focal deficit present.     Mental Status: He is alert and oriented to person, place, and time.  Psychiatric:        Mood and Affect: Mood normal.        Behavior: Behavior normal.     ED Results / Procedures / Treatments   Labs (all labs ordered are listed, but only abnormal results are displayed) Labs Reviewed  COMPREHENSIVE METABOLIC PANEL - Abnormal; Notable for the following components:  Result Value   Sodium 134 (*)    Chloride 94 (*)    Glucose, Bld 141 (*)    BUN 26 (*)    Creatinine, Ser 1.38 (*)    Albumin 3.2 (*)    Total Bilirubin 1.3 (*)    GFR, Estimated 52 (*)    All other components within normal limits  CBC WITH DIFFERENTIAL/PLATELET - Abnormal; Notable for the following components:   WBC 14.8 (*)    RBC 3.09 (*)    Hemoglobin 10.0 (*)    HCT 32.2 (*)    MCV 104.2 (*)    RDW 15.9 (*)    Platelets 412 (*)    Neutro Abs 11.3 (*)    Monocytes Absolute 1.1 (*)    Abs Immature Granulocytes 0.10 (*)    All other components within normal limits    EKG EKG Interpretation  Date/Time:  Sunday September 01 2020 21:46:59 EDT Ventricular Rate:  81 PR Interval:  140 QRS Duration: 92 QT Interval:  402 QTC Calculation: 466 R Axis:   64 Text Interpretation: Normal sinus rhythm Anterior infarct , age undetermined ST elevation in Anterior leads improved from prior Confirmed by Gwyneth SproutPlunkett, Whitney ([id number]) on 09/01/2020 10:13:06 PM   Radiology No results found.  Procedures Fecal disimpaction  Date/Time: 09/02/2020 12:38 AM Performed by: Kugler, [place], MD Authorized by: Gwyneth SproutPlunkett, Whitney, MD  Consent: Verbal consent obtained. Consent given by: patient Patient understanding: patient states understanding of the procedure being performed Patient identity  confirmed: verbally with patient and arm band Local anesthesia used: no  Anesthesia: Local anesthesia used: no  Sedation: Patient sedated: no  Patient tolerance: patient tolerated the procedure well with no immediate complications     Medications Ordered in ED Medications - No data to display  ED Course  I have reviewed the triage vital signs and the nursing notes.  Pertinent labs & imaging results that were available during my care of the patient were reviewed by me and considered in my medical decision making (see chart for details).    MDM Rules/Calculators/A&P                          Patient is a 80 year old gentleman who presents with a chief complaint of constipation and lower abdominal cramping.    Patient had a CABG performed on 11 March.  He was discharged from the hospital on Friday.  Patient has not had a bowel movement in 6 days.  EKG on arrival was initially concerning for inferior S T elevations, however repeat EKG with resolution of slight abnormalities found in the inferior leads.  Patient without chest pain at this time.  These changes have been demonstrated on previous EKGs with resolution.  Feel that patient's presentation unlikely secondary to STEMI at this time.  Will not activate code STEMI.  Basic labs ordered.  KUB ordered to evaluate for possible obstruction.  KUB shows significant stool burden and no signs of obstruction.  Fecal disimpaction performed as above.  The procedure was removal of multiple hard stool balls.  Additionally, will give patient enema here.  I have reviewed patient's labs.  Patient with mild AKI increased from his creatinine a few days ago.  This is consistent with patient reporting decreased p.o. intake as he has not been able to stool. As patient is doing well, tolerating PO, able to follow up with PCP, and currently receiving significant support from daughter at bedside I feel that discharge home  after enema is reasonable.  Patient  has tried very minimal medications for constipation relief and therefore has not failed outpatient treatment at this time.  Will encourage increased bowel regimen at discharge.  Patient to receive enema to produce a stool, and then stable for discharge home.  Final Clinical Impression(s) / ED Diagnoses Final diagnoses:  None    Rx / DC Orders ED Discharge Orders    None       Kugler, [place], MD 09/02/20 5638    Gwyneth Sprout, MD 09/04/20 [phone]

## 2020-09-02 DIAGNOSIS — K5900 Constipation, unspecified: Secondary | ICD-10-CM | POA: Diagnosis not present

## 2020-09-02 NOTE — Discharge Instructions (Addendum)
Take miralax twice daily for a week  Stay hydrated.   Follow-up with your doctor.  Return to ER if you have worse abdominal pain, vomiting, fever, chest pain

## 2020-09-02 NOTE — ED Provider Notes (Signed)
  Physical Exam  BP 102/65   Pulse 77   Temp 98 F (36.7 C) (Oral)   Resp (!) 21   SpO2 97%   Physical Exam  ED Course/Procedures     Procedures  MDM  Care assumed at 11 PM.  Patient had constipation and had a disimpaction.  Signout pending enema.  3:09 AM Patient given lactulose and enema and had a large bowel movement.  Is felt better now.  Will discharge home with MiraLAX twice daily and have him follow-up with his doctor    Charlynne Pander, MD 09/02/20 [phone]

## 2020-09-02 NOTE — ED Notes (Signed)
Some results from the enema

## 2020-09-02 NOTE — ED Notes (Signed)
The oil retention enema  Has been given the pt is trying to hold it as long as he can

## 2020-09-03 ENCOUNTER — Telehealth: Payer: Self-pay | Admitting: *Deleted

## 2020-09-03 NOTE — Telephone Encounter (Signed)
Patient's daughter, Vernona Rieger, contacted the office stating her father's [facility] site was draining clear fluid. Per Vernona Rieger, photo was sent to E. Barrett, [facility]. Photo without evidence of redness with slight clear drainage noted at incision. Vernona Rieger denies any white drainage from incision or fever. Advised Vernona Rieger that incision looks fine at this moment but to keep monitoring site for infection. Advised to call if signs or symptoms of infection begin. Vernona Rieger verbalized understanding.

## 2020-09-06 ENCOUNTER — Telehealth (HOSPITAL_COMMUNITY): Payer: Self-pay

## 2020-09-06 NOTE — Telephone Encounter (Signed)
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

## 2020-09-10 ENCOUNTER — Ambulatory Visit (INDEPENDENT_AMBULATORY_CARE_PROVIDER_SITE_OTHER): Payer: Self-pay | Admitting: *Deleted

## 2020-09-10 ENCOUNTER — Other Ambulatory Visit: Payer: Self-pay

## 2020-09-10 DIAGNOSIS — Z4802 Encounter for removal of sutures: Secondary | ICD-10-CM

## 2020-09-10 NOTE — Progress Notes (Signed)
Patient arrived for nurse visit to remove sutures post-CABG 08/23/20 By Dr. Laneta Simmers.  Four sutures removed with no signs or symptoms of infection noted.  All four incisions well approximated. Patient tolerated suture removal well.  Patient and family instructed to keep the incision site clean and dry. Looked at a place of concern on patient's left leg that has been draining. No redness observed. Clear drainage noted. Advised patient to continue keeping site clean and dry and to call our office back if it worsens or appears to become infected. Patient and family acknowledged instructions given.  All questions answered.

## 2020-09-11 ENCOUNTER — Ambulatory Visit (INDEPENDENT_AMBULATORY_CARE_PROVIDER_SITE_OTHER): Payer: Medicare Other | Admitting: Physician Assistant

## 2020-09-11 ENCOUNTER — Encounter: Payer: Self-pay | Admitting: Physician Assistant

## 2020-09-11 VITALS — BP 117/74 | HR 79 | Ht 71.0 in | Wt 144.0 lb

## 2020-09-11 DIAGNOSIS — R400 Somnolence: Secondary | ICD-10-CM

## 2020-09-11 DIAGNOSIS — R0683 Snoring: Secondary | ICD-10-CM

## 2020-09-11 DIAGNOSIS — I25810 Atherosclerosis of coronary artery bypass graft(s) without angina pectoris: Secondary | ICD-10-CM | POA: Diagnosis not present

## 2020-09-11 DIAGNOSIS — I5022 Chronic systolic (congestive) heart failure: Secondary | ICD-10-CM

## 2020-09-11 DIAGNOSIS — Z79899 Other long term (current) drug therapy: Secondary | ICD-10-CM | POA: Diagnosis not present

## 2020-09-11 DIAGNOSIS — E785 Hyperlipidemia, unspecified: Secondary | ICD-10-CM | POA: Diagnosis not present

## 2020-09-11 DIAGNOSIS — I255 Ischemic cardiomyopathy: Secondary | ICD-10-CM

## 2020-09-11 DIAGNOSIS — D649 Anemia, unspecified: Secondary | ICD-10-CM | POA: Diagnosis not present

## 2020-09-11 DIAGNOSIS — I480 Paroxysmal atrial fibrillation: Secondary | ICD-10-CM

## 2020-09-11 DIAGNOSIS — I10 Essential (primary) hypertension: Secondary | ICD-10-CM

## 2020-09-11 MED ORDER — POTASSIUM CHLORIDE CRYS ER 10 MEQ PO TBCR: 10.0000 meq | EXTENDED_RELEASE_TABLET | ORAL | 0 refills | Status: DC | PRN

## 2020-09-11 MED ORDER — ATORVASTATIN CALCIUM 40 MG PO TABS: 40.0000 mg | ORAL_TABLET | Freq: Every day | ORAL | 3 refills | Status: DC

## 2020-09-11 MED ORDER — POTASSIUM CHLORIDE CRYS ER 10 MEQ PO TBCR: 10.0000 meq | EXTENDED_RELEASE_TABLET | Freq: Every day | ORAL | 0 refills | Status: DC

## 2020-09-11 MED ORDER — FUROSEMIDE 20 MG PO TABS: 20.0000 mg | ORAL_TABLET | Freq: Every day | ORAL | 0 refills | Status: DC

## 2020-09-11 MED ORDER — FUROSEMIDE 20 MG PO TABS: 20.0000 mg | ORAL_TABLET | ORAL | 0 refills | Status: DC | PRN

## 2020-09-11 NOTE — Progress Notes (Signed)
Cardiology Office Note:    Date:  09/12/2020   ID:  Paul [name], DOB [birth year], MRN [id number]  PCP:  Alinda Deem, MD   [facility]  Cardiologist:  Nicki Guadalajara, MD  Advanced Practice Provider:  No care team member to display Electrophysiologist:  None   Referring MD: No ref. provider found   Chief Complaint  Patient presents with  . Follow-up    Seen for Dr. Tresa Endo    History of Present Illness:    Paul [name] is a 80 y.o. male with a hx of hypertension, BPH, chronic systolic heart failure and recently diagnosed CAD.  Patient was admitted to [facility] in early March for hypoxia, and found to have reduced ejection fraction on echocardiogram.  Ejection fraction was 30 to 35% on echocardiogram on 08/15/2020, this is accompanied by grade 3 diastolic dysfunction and PASP of 70 mmHg with moderate TR.  Subsequent cardiac catheterization on 08/18/2020 revealed multivessel CAD with 50% distal left main, 75 to 80% LAD, subtotal occlusion mid LAD, 80% ostial D2, 99% mid left circumflex lesion, 75% proximal OM1 lesion, 99% mid RCA lesion.  He was subsequently transferred to [facility] for further evaluation. Patient eventually underwent CABG x3 with LIMA to OM1, SVG to OM 2 and SVG to PLB by Dr. Laneta Simmers on 08/23/2021.  He returned to the OR on the following day for reexploration of mediastinum for bleeding.  Postop course was complicated by volume overload and atrial fibrillation with RVR in the evening of 3/13, he was placed on amiodarone therapy.  He also required midodrine for hypotension.  It was suspected patient likely has obstructive sleep apnea as well and recommend outpatient sleep study.  Patient presents today for follow-up.  Surgical scar is very well-healed.  He is maintaining sinus rhythm based on EKG.  I recommend he finish the refill on the amiodarone and then stop after that.  He only has trace amount of lower extremity edema at this point, this has  been significantly improved.  He is on 20 mg daily of Lasix and a 10 mill equivalent daily of potassium.  He will continue on the current dose and he finished a 30-day supply, if lower extremity edema improve, he can switch to as needed after that.  I recommend a basic metabolic panel and a CBC.  He is currently on high-dose aspirin, I will defer to Dr. Tresa Endo to decide when to lower the aspirin dose to 81 mg.  I also discussed sleep study with the patient and his daughter.  He can follow-up with Dr. Tresa Endo in 3 months.  In the future, if travel is an issue, he can either follow-up with Dr. Hyacinth Meeker in [place] who did the original cardiac catheterization or our cardiologist in [place].   Past Medical History:  Diagnosis Date  . Glaucoma   . High blood pressure   . High cholesterol     Past Surgical History:  Procedure Laterality Date  . CARDIAC CATHETERIZATION  08/19/2020  . CHEST EXPLORATION N/A 08/23/2020   Procedure: RE- EXPLORATION POST CABG;  Surgeon: Alleen Borne, MD;  Location: [facility] OR;  Service: Thoracic;  Laterality: N/A;  . CORONARY ARTERY BYPASS GRAFT N/A 08/23/2020   Procedure: CORONARY ARTERY BYPASS GRAFTING (CABG) X   THREE , LEFT INTERNAL MAMMARY HARVEST, RIGHT LEG ENDOSCOPIC SAPHENOUS VEIN HARVESTING, LEFT ENDOSCOPIC SAPHENOUS VEIN HARVEST;  Surgeon: Alleen Borne, MD;  Location: [facility] OR;  Service: Open Heart Surgery;  Laterality: N/A;  .  TEE WITHOUT CARDIOVERSION N/A 08/23/2020   Procedure: TRANSESOPHAGEAL ECHOCARDIOGRAM (TEE);  Surgeon: Alleen Borne, MD;  Location: [facility] OR;  Service: Open Heart Surgery;  Laterality: N/A;  . WRIST FRACTURE SURGERY Right    "years ago"    Current Medications: Current Meds  Medication Sig  . amiodarone (PACERONE) 200 MG tablet Take 1 tablet (200 mg total) by mouth 2 (two) times daily. For 2 days then take 200 mg daily thereafter  . aspirin EC 325 MG EC tablet Take 1 tablet (325 mg total) by mouth daily.  . ferrous sulfate 325 (65 FE) MG tablet  Take 1 tablet (325 mg total) by mouth daily. For one month then stop. If develops constipation, may take laxative or stop sooner  . finasteride (PROSCAR) 5 MG tablet Take 1 tablet (5 mg total) by mouth daily.  Marland Kitchen latanoprost (XALATAN) 0.005 % ophthalmic solution Place 1 drop into both eyes at bedtime.  . midodrine (PROAMATINE) 5 MG tablet Take 1 tablet (5 mg total) by mouth 2 (two) times daily with a meal.  . Multiple Vitamin (MULTIVITAMIN WITH MINERALS) TABS tablet Take 1 tablet by mouth daily.  . [DISCONTINUED] acetaminophen (TYLENOL) 325 MG tablet Take 2 tablets (650 mg total) by mouth every 6 (six) hours as needed for mild pain.  . [DISCONTINUED] atorvastatin (LIPITOR) 10 MG tablet Take 1 tablet (10 mg total) by mouth daily.  . [DISCONTINUED] furosemide (LASIX) 20 MG tablet Take 1 tablet (20 mg total) by mouth daily.  . [DISCONTINUED] potassium chloride (KLOR-CON) 10 MEQ tablet Take 1 tablet (10 mEq total) by mouth daily.  . [DISCONTINUED] traMADol (ULTRAM) 50 MG tablet Take 1 tablet (50 mg total) by mouth every 6 (six) hours as needed for severe pain.     Allergies:   Patient has no known allergies.   Social History   Socioeconomic History  . Marital status: Widowed    Spouse name: Not on file  . Number of children: Not on file  . Years of education: Not on file  . Highest education level: Not on file  Occupational History  . Not on file  Tobacco Use  . Smoking status: Never Smoker  . Smokeless tobacco: Never Used  Substance and Sexual Activity  . Alcohol use: Not Currently  . Drug use: Never  . Sexual activity: Not on file  Other Topics Concern  . Not on file  Social History Narrative  . Not on file   Social Determinants of Health   Financial Resource Strain: Not on file  Food Insecurity: Not on file  Transportation Needs: Not on file  Physical Activity: Not on file  Stress: Not on file  Social Connections: Not on file     Family History: The patient's family  history is not on file.  ROS:   Please see the history of present illness.     All other systems reviewed and are negative.  EKGs/Labs/Other Studies Reviewed:    The following studies were reviewed today:  Echo 08/19/2020 1. Left ventricular ejection fraction, by estimation, is 25 to 30%. The  left ventricle has severely decreased function.  2. The left ventricle demonstrates regional wall motion abnormalities .  All mid-to-apical septal, mid-to-apical anterolateral, mid-to-apical  anterior, apical inferior and apex appear akinetic. The rest of the LV  walls are hypokinetic.  3. There is mild concentric left ventricular hypertrophy. Left  ventricular diastolic parameters are consistent with Grade II diastolic  dysfunction (pseudonormalization).  4. Swirling of contrast seen in  the left ventricle consistent with low  flow state. No LV thrombus visualized.  5. Right ventricular systolic function is normal. The right ventricular  size is normal.  6. The mitral valve is abnormal. Trivial mitral valve regurgitation. No  evidence of mitral stenosis. Moderate mitral annular calcification.  7. The aortic valve has an indeterminant number of cusps. There is  moderate calcification of the aortic valve. There is moderate thickening  of the aortic valve. Aortic valve regurgitation is not visualized. Mild  aortic valve stenosis. Gradients low due  to low output state.  8. Wall motion concerning for multivessel coronary artery disease.   EKG:  EKG is  ordered today.  The ekg ordered today demonstrates normal sinus rhythm with T wave inversion in the lateral leads.  Recent Labs: 08/19/2020: B Natriuretic Peptide 1,109.1; TSH 3.177 08/27/2020: Magnesium 1.7 09/01/2020: ALT 14 09/11/2020: BUN 24; Creatinine, Ser 1.45; Hemoglobin 10.4; Platelets 409; Potassium 5.1; Sodium 137  Recent Lipid Panel    Component Value Date/Time   CHOL 105 08/19/2020 0358   TRIG 82 08/19/2020 0358   HDL 41  08/19/2020 0358   CHOLHDL 2.6 08/19/2020 0358   VLDL 16 08/19/2020 0358   LDLCALC 48 08/19/2020 0358     Risk Assessment/Calculations:    CHA2DS2-VASc Score = 5  This indicates a 7.2% annual risk of stroke. The patient's score is based upon: CHF History: Yes HTN History: Yes Diabetes History: No Stroke History: No Vascular Disease History: Yes Age Score: 2 Gender Score: 0      Physical Exam:    VS:  BP 117/74   Pulse 79   Ht 5\' 11"  (1.803 m)   Wt 144 lb (65.3 kg)   BMI 20.08 kg/m     Wt Readings from Last 3 Encounters:  09/11/20 144 lb (65.3 kg)  08/30/20 138 lb 7.2 oz (62.8 kg)     GEN:  Well nourished, well developed in no acute distress HEENT: Normal NECK: No JVD; No carotid bruits LYMPHATICS: No lymphadenopathy CARDIAC: RRR, no murmurs, rubs, gallops RESPIRATORY:  Clear to auscultation without rales, wheezing or rhonchi  ABDOMEN: Soft, non-tender, non-distended MUSCULOSKELETAL:  No edema; No deformity  SKIN: Warm and dry NEUROLOGIC:  Alert and oriented x 3 PSYCHIATRIC:  Normal affect   ASSESSMENT:    1. 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 Medication management   3. Anemia, unspecified type   4. Hyperlipidemia, unspecified hyperlipidemia type   5. Snoring   6. Somnolence   7. Primary hypertension   8. Ischemic cardiomyopathy   9. PAF (paroxysmal atrial fibrillation) (HCC)   10. Chronic systolic heart failure (HCC)    PLAN:    In order of problems listed above:  1. CAD s/p recent CABG: Continue high-dose aspirin  2. Anemia: Obtain CBC  3. Hyperlipidemia: On Lipitor  4. Daytime somnolence/snoring: Obtain sleep study per recommendation by cardiothoracic surgery  5. Hypertension: He had hypotension during the hospitalization required initiation of midodrine.  We will try to wean off of midodrine in the next few months.  6. Ischemic cardiomyopathy: Mild lower extremity edema on  exam today.  EF 35% on recent echocardiogram.  Not on heart failure medication due to low blood pressure.  Once come off of midodrine, will consider add heart failure medication.  7. Postop atrial fibrillation: On amiodarone.  Currently on 200 mg daily dosing.  Once he finished the current prescription and refill, he can stop the  amiodarone  8. Chronic systolic heart failure: Minimal lower extremity edema on exam today.  Once he finish the current dose of Lasix, he can transition to as needed based on lower extremity edema.        Medication Adjustments/Labs and Tests Ordered: Current medicines are reviewed at length with the patient today.  Concerns regarding medicines are outlined above.  Orders Placed This Encounter  Procedures  . Hepatic function panel  . Lipid panel  . Basic metabolic panel  . CBC  . EKG 12-Lead  . Split night study   Meds ordered this encounter  Medications  . DISCONTD: potassium chloride (KLOR-CON) 10 MEQ tablet    Sig: Take 1 tablet (10 mEq total) by mouth daily.    Dispense:  30 tablet    Refill:  0  . DISCONTD: furosemide (LASIX) 20 MG tablet    Sig: Take 1 tablet (20 mg total) by mouth daily.    Dispense:  30 tablet    Refill:  0  . potassium chloride (KLOR-CON) 10 MEQ tablet    Sig: Take 1 tablet (10 mEq total) by mouth as needed.    Dispense:  30 tablet    Refill:  0  . furosemide (LASIX) 20 MG tablet    Sig: Take 1 tablet (20 mg total) by mouth as needed.    Dispense:  30 tablet    Refill:  0  . atorvastatin (LIPITOR) 40 MG tablet    Sig: Take 1 tablet (40 mg total) by mouth daily.    Dispense:  90 tablet    Refill:  3    Patient Instructions  Medication Instructions:   INCREASE Lipitor to 40 mg daily  Finish 30 day supply of Potassium and Lasix. Once completed take Potassium and Lasix as needed  Finish Amiodarone  Then stop once refill is finished  *If you need a refill on your cardiac medications before your next appointment,  please call your pharmacy*  Lab Work: Your physician recommends that you return for lab work TODAY:   BMET  CBC  Your physician recommends that you return for lab work in 2 months:   Hepatic (Liver) Function Test  Fasting Lipid Panel-DO NOT EAT OR DRINK PAST MIDNIGHT. OKAY TO HAVE WATER  If you have labs (blood work) drawn today and your tests are completely normal, you will receive your results only by: Marland Kitchen MyChart Message (if you have MyChart) OR . A paper copy in the mail If you have any lab test that is abnormal or we need to change your treatment, we will call you to review the results.  Testing/Procedures: NONE ordered at this time of appointment   Follow-Up: At [facility], you and your health needs are our priority.  As part of our continuing mission to provide you with exceptional heart care, we have created designated Provider Care Teams.  These Care Teams include your primary Cardiologist (physician) and Advanced Practice Providers (APPs -  Physician Assistants and Nurse Practitioners) who all work together to provide you with the care you need, when you need it.  We recommend signing up for the patient portal called "MyChart".  Sign up information is provided on this After Visit Summary.  MyChart is used to connect with patients for Virtual Visits (Telemedicine).  Patients are able to view lab/test results, encounter notes, upcoming appointments, etc.  Non-urgent messages can be sent to your provider as well.   To learn more about what you can do with MyChart,  go to [email or web address].    Your next appointment:   3-4 month(s)  The format for your next appointment:   In Person  Provider:   Nicki Guadalajarahomas Kelly, MD  Other Instructions Your physician has recommended that you have a sleep study. This test records several body functions during sleep, including: brain activity, eye movement, oxygen and carbon dioxide blood levels, heart rate and rhythm, breathing rate and  rhythm, the flow of air through your mouth and nose, snoring, body muscle movements, and chest and belly movement.       Ramond DialSigned, Reighan Hipolito, [place]  09/12/2020 9:34 PM    [facility]

## 2020-09-11 NOTE — Patient Instructions (Addendum)
Medication Instructions:   INCREASE Lipitor to 40 mg daily  Finish 30 day supply of Potassium and Lasix. Once completed take Potassium and Lasix as needed  Finish Amiodarone  Then stop once refill is finished  *If you need a refill on your cardiac medications before your next appointment, please call your pharmacy*  Lab Work: Your physician recommends that you return for lab work TODAY:   BMET  CBC  Your physician recommends that you return for lab work in 2 months:   Hepatic (Liver) Function Test  Fasting Lipid Panel-DO NOT EAT OR DRINK PAST MIDNIGHT. OKAY TO HAVE WATER  If you have labs (blood work) drawn today and your tests are completely normal, you will receive your results only by: Marland Kitchen MyChart Message (if you have MyChart) OR . A paper copy in the mail If you have any lab test that is abnormal or we need to change your treatment, we will call you to review the results.  Testing/Procedures: NONE ordered at this time of appointment   Follow-Up: At [facility], you and your health needs are our priority.  As part of our continuing mission to provide you with exceptional heart care, we have created designated Provider Care Teams.  These Care Teams include your primary Cardiologist (physician) and Advanced Practice Providers (APPs -  Physician Assistants and Nurse Practitioners) who all work together to provide you with the care you need, when you need it.  We recommend signing up for the patient portal called "MyChart".  Sign up information is provided on this After Visit Summary.  MyChart is used to connect with patients for Virtual Visits (Telemedicine).  Patients are able to view lab/test results, encounter notes, upcoming appointments, etc.  Non-urgent messages can be sent to your provider as well.   To learn more about what you can do with MyChart, go to [email or web address].    Your next appointment:   3-4 month(s)  The format for your next appointment:   In  Person  Provider:   Nicki Guadalajara, MD  Other Instructions Your physician has recommended that you have a sleep study. This test records several body functions during sleep, including: brain activity, eye movement, oxygen and carbon dioxide blood levels, heart rate and rhythm, breathing rate and rhythm, the flow of air through your mouth and nose, snoring, body muscle movements, and chest and belly movement.

## 2020-09-12 ENCOUNTER — Encounter: Payer: Self-pay | Admitting: Physician Assistant

## 2020-09-12 ENCOUNTER — Telehealth: Payer: Self-pay | Admitting: *Deleted

## 2020-09-12 LAB — BASIC METABOLIC PANEL
BUN/Creatinine Ratio: 17 (ref 10–24)
BUN: 24 mg/dL (ref 8–27)
CO2: 23 mmol/L (ref 20–29)
Calcium: 9.2 mg/dL (ref 8.6–10.2)
Chloride: 99 mmol/L (ref 96–106)
Creatinine, Ser: 1.45 mg/dL — ABNORMAL HIGH (ref 0.76–1.27)
Glucose: 110 mg/dL — ABNORMAL HIGH (ref 65–99)
Potassium: 5.1 mmol/L (ref 3.5–5.2)
Sodium: 137 mmol/L (ref 134–144)
eGFR: 49 mL/min/{1.73_m2} — ABNORMAL LOW (ref >59)

## 2020-09-12 LAB — CBC
Hematocrit: 31.8 % — ABNORMAL LOW (ref 37.5–51.0)
Hemoglobin: 10.4 g/dL — ABNORMAL LOW (ref 13.0–17.7)
MCH: 31.5 pg (ref 26.6–33.0)
MCHC: 32.7 g/dL (ref 31.5–35.7)
MCV: 96 fL (ref 79–97)
Platelets: 409 10*3/uL (ref 150–450)
RBC: 3.3 x10E6/uL — ABNORMAL LOW (ref 4.14–5.80)
RDW: 14 % (ref 11.6–15.4)
WBC: 6.9 10*3/uL (ref 3.4–10.8)

## 2020-09-12 NOTE — Telephone Encounter (Signed)
Patient and daughter notified of sleep study appointment details.

## 2020-09-21 ENCOUNTER — Other Ambulatory Visit: Payer: Self-pay | Admitting: Physician Assistant

## 2020-09-25 ENCOUNTER — Other Ambulatory Visit: Payer: Self-pay | Admitting: Surgery

## 2020-09-25 ENCOUNTER — Ambulatory Visit: Payer: Medicare Other | Admitting: Surgery

## 2020-09-25 DIAGNOSIS — Z951 Presence of aortocoronary bypass graft: Secondary | ICD-10-CM

## 2020-09-26 ENCOUNTER — Other Ambulatory Visit: Payer: Self-pay

## 2020-09-26 ENCOUNTER — Ambulatory Visit
Admission: RE | Admit: 2020-09-26 | Discharge: 2020-09-26 | Disposition: A | Discharge status: Home / Self Care | Payer: Medicare Other | Source: Ambulatory Visit | Attending: Surgery | Admitting: Surgery

## 2020-09-26 ENCOUNTER — Encounter: Payer: Self-pay | Admitting: Surgery

## 2020-09-26 ENCOUNTER — Ambulatory Visit (INDEPENDENT_AMBULATORY_CARE_PROVIDER_SITE_OTHER): Payer: Self-pay | Admitting: Surgery

## 2020-09-26 VITALS — BP 143/87 | HR 88 | Resp 20 | Ht 71.0 in | Wt 149.0 lb

## 2020-09-26 DIAGNOSIS — Z951 Presence of aortocoronary bypass graft: Secondary | ICD-10-CM

## 2020-09-26 MED ORDER — FUROSEMIDE 20 MG PO TABS
20.0000 mg | ORAL_TABLET | ORAL | 0 refills | Status: DC | PRN
Start: 2020-09-26 — End: 2020-11-14

## 2020-09-26 NOTE — Progress Notes (Signed)
HPI: Patient returns for routine postoperative follow-up having undergone coronary bypass graft surgery x3 on 08/23/2020.  He required a return to the operating room for postoperative bleeding. The patient's early postoperative recovery while in the hospital was notable for postoperative vasodilatation requiring midodrine to support his blood pressure. He also had postop atrial fibrillation converted with amiodarone. Since hospital discharge the patient reports that he has been feeling well.  He is walking daily without chest pain or shortness of breath.  He is still living with his daughter but anxious to return home.   Current Outpatient Medications  Medication Sig Dispense Refill  . amiodarone (PACERONE) 200 MG tablet Take 1 tablet (200 mg total) by mouth 2 (two) times daily. For 2 days then take 200 mg daily thereafter 60 tablet 1  . aspirin EC 325 MG EC tablet Take 1 tablet (325 mg total) by mouth daily. 30 tablet 0  . atorvastatin (LIPITOR) 40 MG tablet Take 1 tablet (40 mg total) by mouth daily. 90 tablet 3  . ferrous sulfate 325 (65 FE) MG tablet Take 1 tablet (325 mg total) by mouth daily. For one month then stop. If develops constipation, may take laxative or stop sooner 30 tablet 0  . finasteride (PROSCAR) 5 MG tablet Take 1 tablet (5 mg total) by mouth daily. 30 tablet 1  . furosemide (LASIX) 20 MG tablet Take 1 tablet (20 mg total) by mouth as needed. 30 tablet 0  . latanoprost (XALATAN) 0.005 % ophthalmic solution Place 1 drop into both eyes at bedtime.    . midodrine (PROAMATINE) 5 MG tablet Take 1 tablet (5 mg total) by mouth 2 (two) times daily with a meal. 60 tablet 1  . Multiple Vitamin (MULTIVITAMIN WITH MINERALS) TABS tablet Take 1 tablet by mouth daily.    . potassium chloride (KLOR-CON) 10 MEQ tablet Take 1 tablet (10 mEq total) by mouth as needed. 30 tablet 0   No current facility-administered medications for this visit.    Physical Exam: BP (!) 143/87 (BP  Location: Right Arm, Patient Position: Sitting)   Pulse 88   Resp 20   Ht 5\' 11"  (1.803 m)   Wt 149 lb (67.6 kg)   SpO2 95% Comment: RA  BMI 20.78 kg/m  He looks well. Cardiac exam shows a regular rate and rhythm with normal heart sounds. Lungs are clear. There is mild bilateral ankle edema greater on the left than the right which is where his saphenous vein was harvested.  Diagnostic Tests:  Narrative & Impression  CLINICAL DATA:  Status post coronary artery bypass grafting  EXAM: CHEST - 2 VIEW  COMPARISON:  August 28, 2020  FINDINGS: There are small pleural effusions bilaterally with bibasilar atelectatic change. There is mild asymmetric pleural thickening on the left. No pneumothorax evident. Heart size and pulmonary vascularity are normal. Patient is status post coronary artery bypass grafting. There is aortic atherosclerosis. No adenopathy. No bone lesions.  IMPRESSION: No pneumothorax. Small pleural effusions bilaterally with bibasilar atelectasis. Status post coronary artery bypass grafting. Heart size normal.  Aortic Atherosclerosis (ICD10-I70.0).   Electronically Signed   By: August 30, 2020 III M.D.   On: 09/26/2020 09:25      Impression:  Overall I think he is making good progress following his surgery.  He still has some ankle edema and his chest x-ray shows small bilateral pleural effusions with bibasilar atelectasis.  I asked him to increase his Lasix to 40 mg daily for 1 week and  then back to 20 mg daily.  He will continue to monitor his weight at home.  His postoperative hypotension has resolved so he will stop his midodrine.  He appears to be maintaining sinus rhythm and I asked him to discontinue the amiodarone after his next prescription.  I think he can start cardiac rehab which he would like to do in [place].  We will make that referral.  Plan:  He has a follow-up appointment with Dr. Tresa Endo at the end of May.  He will return to see  me if he has any problems with his incisions.   Alleen Borne, MD [facility] [phone]

## 2020-09-30 ENCOUNTER — Other Ambulatory Visit: Payer: Self-pay

## 2020-09-30 MED ORDER — POTASSIUM CHLORIDE CRYS ER 10 MEQ PO TBCR: 10.0000 meq | EXTENDED_RELEASE_TABLET | ORAL | 0 refills | Status: DC | PRN

## 2020-09-30 NOTE — Progress Notes (Signed)
Patient's daughter called stating that when his Lasix and Potassium was increased by Dr. Laneta Simmers at his last appointment his medication was used faster than prescribed and needed a refill.  Per daughter, refill sent to [facility] on [address] In [place], [place].  She also had questions about his blood pressure medication and asked if he needed to be restared back on those.  Advised that if they were not prescribed at discharge from the hospital, then he did not need to take them unless he was instructed to do so by a physician. She acknowledged receipt.

## 2020-10-01 ENCOUNTER — Telehealth: Payer: Self-pay

## 2020-10-01 NOTE — Telephone Encounter (Signed)
Patient's daughter, Vernona Rieger contacted the office to ask if patient should be taking his blood pressure medication.  She is aware that it was stopped at discharge from the hospital. Advised that if the medication was stopped that he should not take it.  Patient was also taking Midodrine which was stopped at his last post op appointment with Dr. Laneta Simmers.  Advised to not take blood pressure medication unless a physician instructed him to do so.  She stated that his blood pressure when checked this morning, it was 106/59 P 72.

## 2020-10-04 ENCOUNTER — Other Ambulatory Visit: Payer: Self-pay | Admitting: *Deleted

## 2020-10-04 DIAGNOSIS — Z951 Presence of aortocoronary bypass graft: Secondary | ICD-10-CM

## 2020-10-07 ENCOUNTER — Telehealth: Payer: Self-pay

## 2020-10-07 ENCOUNTER — Telehealth: Payer: Self-pay | Admitting: *Deleted

## 2020-10-07 ENCOUNTER — Other Ambulatory Visit: Payer: Self-pay | Admitting: Surgery

## 2020-10-07 DIAGNOSIS — Z951 Presence of aortocoronary bypass graft: Secondary | ICD-10-CM

## 2020-10-07 NOTE — Telephone Encounter (Signed)
Per Dr. Sharee Pimple orders, clinical documents and order for cardiac rehab faxed to [facility] at [phone]. French Ana from their cardiac rehab program called stating patient has declined rehab at this time. Patient's daughter Vernona Rieger contacted and notified. Vernona Rieger was provided the cardiac rehab program phone number. No further questions at this time.

## 2020-10-07 NOTE — Telephone Encounter (Signed)
Patient's daughter, Vernona Rieger contacted the office with questions about patient's blood pressure to see if this was normal.  She stated that his blood pressure was 93/54 today, 104/55 yesterday, and 106/58 Saturday.  She stated that he was feeling well, no complaints.  She said that he is currently not taking any blood pressure medications nor Midodrine that he was on when discharged from the hospital.  Advised that those readings are normal and as long as he is feeling well no action to take at this time. She acknowledged receipt.

## 2020-10-09 ENCOUNTER — Ambulatory Visit (INDEPENDENT_AMBULATORY_CARE_PROVIDER_SITE_OTHER): Payer: Self-pay | Admitting: Surgery

## 2020-10-09 ENCOUNTER — Other Ambulatory Visit: Payer: Self-pay

## 2020-10-09 ENCOUNTER — Ambulatory Visit
Admission: RE | Admit: 2020-10-09 | Discharge: 2020-10-09 | Disposition: A | Discharge status: Home / Self Care | Payer: Medicare Other | Source: Ambulatory Visit | Attending: Surgery | Admitting: Surgery

## 2020-10-09 ENCOUNTER — Other Ambulatory Visit: Payer: Self-pay | Admitting: Surgery

## 2020-10-09 ENCOUNTER — Encounter: Payer: Self-pay | Admitting: Surgery

## 2020-10-09 VITALS — BP 116/74 | HR 77 | Resp 20 | Ht 71.0 in | Wt 149.0 lb

## 2020-10-09 DIAGNOSIS — Z951 Presence of aortocoronary bypass graft: Secondary | ICD-10-CM

## 2020-10-10 ENCOUNTER — Ambulatory Visit: Payer: Medicare Other | Admitting: Surgery

## 2020-10-10 ENCOUNTER — Other Ambulatory Visit: Payer: Self-pay

## 2020-10-10 DIAGNOSIS — I10 Essential (primary) hypertension: Secondary | ICD-10-CM

## 2020-10-10 MED ORDER — POTASSIUM CHLORIDE ER 10 MEQ PO TBCR: 10.0000 meq | EXTENDED_RELEASE_TABLET | Freq: Two times a day (BID) | ORAL | 2 refills | Status: DC

## 2020-10-14 NOTE — Progress Notes (Signed)
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  He required a return to the operating room for postoperative bleeding.  I last saw him on 09/26/2020 at which time he was making good progress.  He still has some ankle edema and his chest x-ray at that time showed small bilateral pleural effusions with bibasilar atelectasis.  Increase his Lasix to 40 mg daily for 1 week and then put it back to 20 mg daily.  He says that he feels well without shortness of breath or chest pain.  He still has some swelling in his lower legs.  He has returned home to [place].  He is here today with his daughter.  Current Outpatient Medications  Medication Sig Dispense Refill  . amiodarone (PACERONE) 200 MG tablet Take 1 tablet (200 mg total) by mouth 2 (two) times daily. For 2 days then take 200 mg daily thereafter 60 tablet 1  . aspirin EC 325 MG EC tablet Take 1 tablet (325 mg total) by mouth daily. 30 tablet 0  . atorvastatin (LIPITOR) 40 MG tablet Take 1 tablet (40 mg total) by mouth daily. 90 tablet 3  . finasteride (PROSCAR) 5 MG tablet Take 1 tablet (5 mg total) by mouth daily. 30 tablet 1  . latanoprost (XALATAN) 0.005 % ophthalmic solution Place 1 drop into both eyes at bedtime.    . Multiple Vitamin (MULTIVITAMIN WITH MINERALS) TABS tablet Take 1 tablet by mouth daily.    . potassium chloride (KLOR-CON) 10 MEQ tablet Take 1 tablet (10 mEq total) by mouth as needed. 30 tablet 0  . furosemide (LASIX) 20 MG tablet Take 1 tablet (20 mg total) by mouth as needed for up to 7 days. 30 tablet 0  . potassium chloride (KLOR-CON) 10 MEQ tablet Take 1 tablet (10 mEq total) by mouth 2 (two) times daily. 60 tablet 2   No current facility-administered medications for this visit.     Physical Exam: BP 116/74 (BP Location: Left Arm, Patient Position: Sitting)   Pulse 77   Resp 20   Ht 5\' 11"  (1.803 m)   Wt 149 lb (67.6 kg)   SpO2 95%  Comment: RA  BMI 20.78 kg/m  He looks well. Cardiac exam shows a regular rate and rhythm with normal heart sounds. Lung exam is clear. The chest incision is healing well and the sternum is stable. His leg incisions are healing well and there is mild bilateral ankle edema.  Diagnostic Tests:  Narrative & Impression  CLINICAL DATA:  Status post coronary bypass graft.  EXAM: CHEST - 2 VIEW  COMPARISON:  September 26, 2020.  FINDINGS: Stable cardiomediastinal silhouette. Sternotomy wires are noted. No pneumothorax is noted. Bilateral pleural effusions are noted with associated subsegmental atelectasis, left greater than right. Bony thorax is unremarkable.  IMPRESSION: Stable bilateral pleural effusions and associated subsegmental atelectasis.   Electronically Signed   By: September 28, 2020 M.D.   On: 10/09/2020 15:17      Impression:  Overall I think he is making good progress.  He still has some volume overload with edema in his ankles and small bilateral pleural effusions.  His preoperative ejection fraction was 30 to 35%.  It was not possible to get him on heart failure medications initially due to vasodilatation with borderline blood pressure requiring midodrine.  I asked him to increase his Lasix to 40 mg daily and increase his potassium chloride to 20 mEq daily  to see if that will resolve his edema and pleural effusions.  I asked him to keep track of his weight at home and let us know if it is not gradually decreasing.  I told him he can return to driving a car but should refrain lifting anything heavier than 10 pounds for 3 months postoperatively.  Plan:  I will see him back in 3 weeks for follow-up with a chest x-ray.  He has a follow-up appoint with Dr. Tresa Endo at the end of May.   Alleen Borne, MD [facility] [phone]

## 2020-10-23 ENCOUNTER — Other Ambulatory Visit: Payer: Self-pay | Admitting: Surgery

## 2020-10-29 ENCOUNTER — Telehealth: Payer: Self-pay | Admitting: *Deleted

## 2020-10-29 NOTE — Telephone Encounter (Signed)
Patient's daughter, Vernona Rieger, contacted the office stating her father's bilateral lower arms are slightly swollen with a rash to the anterior portion. Patient denies any pain, SOB, or fever. Patient denies itch. Patient's daily weights have remained stable between 140lb-143lb with today's being 140lb. Per daughter, patient states lower extremity edema has decreased. BP this am 107/62. Patient denies doing any recent yard work. Per daughter, patient states he recently switched soaps. Advised patient to go back to soap he was using prior to his recent switch. Advised patient may take a benadryl if needed and to monitor site. Patient has upcoming appt with Dr. Laneta Simmers on Thursday. Vernona Rieger verbalized understanding.

## 2020-10-30 ENCOUNTER — Other Ambulatory Visit: Payer: Self-pay | Admitting: Surgery

## 2020-10-30 DIAGNOSIS — Z951 Presence of aortocoronary bypass graft: Secondary | ICD-10-CM

## 2020-10-31 ENCOUNTER — Ambulatory Visit (INDEPENDENT_AMBULATORY_CARE_PROVIDER_SITE_OTHER): Payer: Self-pay | Admitting: Surgery

## 2020-10-31 ENCOUNTER — Other Ambulatory Visit: Payer: Self-pay

## 2020-10-31 ENCOUNTER — Other Ambulatory Visit: Payer: Self-pay | Admitting: Surgery

## 2020-10-31 ENCOUNTER — Encounter: Payer: Self-pay | Admitting: Surgery

## 2020-10-31 ENCOUNTER — Ambulatory Visit
Admission: RE | Admit: 2020-10-31 | Discharge: 2020-10-31 | Disposition: A | Discharge status: Home / Self Care | Payer: Medicare Other | Source: Ambulatory Visit | Attending: Surgery | Admitting: Surgery

## 2020-10-31 VITALS — BP 114/73 | HR 90 | Resp 20 | Ht 71.0 in | Wt 146.2 lb

## 2020-10-31 DIAGNOSIS — Z951 Presence of aortocoronary bypass graft: Secondary | ICD-10-CM

## 2020-10-31 LAB — BASIC METABOLIC PANEL
BUN/Creatinine Ratio: 14 (calc) (ref 6–22)
BUN: 21 mg/dL (ref 7–25)
CO2: 32 mmol/L (ref 20–32)
Calcium: 8.9 mg/dL (ref 8.6–10.3)
Chloride: 98 mmol/L (ref 98–110)
Creat: 1.48 mg/dL — ABNORMAL HIGH (ref 0.70–1.18)
Glucose, Bld: 78 mg/dL (ref 65–99)
Potassium: 4.1 mmol/L (ref 3.5–5.3)
Sodium: 138 mmol/L (ref 135–146)

## 2020-10-31 NOTE — Progress Notes (Signed)
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  He required a return to the operating room for postoperative bleeding.  When I last saw him on 10/09/2020 he still has some edema in his ankles and small bilateral pleural effusions.  His preoperative ejection fraction was 30 to 35%.  He was not on heart failure medications due to postoperative vasodilatation with borderline blood pressure requiring midodrine.  I did increase his Lasix to 40 mg daily at that time and increase his potassium chloride to 20 mEq daily to try to resolve his peripheral edema and pleural effusions.  He said that he has been doing well at home and denies any shortness of breath or chest pain.  He did develop swelling and rash on his arms and exposed distribution below his short sleeves a few days ago.  He did not have any other rash.  He had been out in the sun during the day.  He called the office and was instructed to take some Benadryl which seemed to help.  Current Outpatient Medications  Medication Sig Dispense Refill  . aspirin EC 325 MG EC tablet Take 1 tablet (325 mg total) by mouth daily. 30 tablet 0  . atorvastatin (LIPITOR) 40 MG tablet Take 1 tablet (40 mg total) by mouth daily. 90 tablet 3  . finasteride (PROSCAR) 5 MG tablet Take 1 tablet (5 mg total) by mouth daily. 30 tablet 1  . latanoprost (XALATAN) 0.005 % ophthalmic solution Place 1 drop into both eyes at bedtime.    . Multiple Vitamin (MULTIVITAMIN WITH MINERALS) TABS tablet Take 1 tablet by mouth daily.    . potassium chloride (KLOR-CON) 10 MEQ tablet Take 1 tablet (10 mEq total) by mouth as needed. 30 tablet 0  . potassium chloride (KLOR-CON) 10 MEQ tablet Take 1 tablet (10 mEq total) by mouth 2 (two) times daily. 60 tablet 2  . furosemide (LASIX) 20 MG tablet Take 1 tablet (20 mg total) by mouth as needed for up to 7 days. 30 tablet 0   No current facility-administered medications for this visit.      Physical Exam: BP 114/73 (BP Location: Right Arm, Patient Position: Sitting, Cuff Size: Large)   Pulse 90   Resp 20   Ht 5\' 11"  (1.803 m)   Wt 146 lb 3.2 oz (66.3 kg)   SpO2 96% Comment: RA  BMI 20.39 kg/m  He looks well. Cardiac exam shows a regular rate and rhythm with normal heart sounds.  There is no murmur. Lung exam reveals decreased breath sounds over the left lower lobe. The chest incision is well-healed and the sternum is stable. There is mild bilateral lower extremity edema to the knee. There is some edema of both upper extremities with a resolving rash.  Diagnostic Tests:  Narrative & Impression  CLINICAL DATA:  Status post CABG.  EXAM: CHEST - 2 VIEW  COMPARISON:  10/09/2020  FINDINGS: Lungs are hyperexpanded. Near complete resolution of right pleural effusion seen previously. Small left pleural effusion persists with decrease in associated left base atelectasis. Cardiopericardial silhouette is at upper limits of normal for size. Bones are diffusely demineralized.  IMPRESSION: 1. Near complete resolution of right pleural effusion. 2. Similar small left pleural effusion with decrease in left base atelectasis.   Electronically Signed   By: 10/11/2020 M.D.   On: 10/31/2020 15:24      Impression:  He is doing fairly well clinically with a  normal blood pressure.  His chest x-ray today does show a moderate left pleural effusion which looks larger than on his previous chest x-ray a couple weeks ago.  The right pleural effusion has resolved.  I think would be best to proceed with a left thoracentesis to remove the effusion.  He denies any symptoms but I suspect he is not that active.  He also has some lower extremity edema although it is better than last time I saw him.  I asked him to continue his Lasix at 40 mg daily.  The etiology of his upper extremity rash is unclear.  It looks like a photosensitivity and could have been precipitated by the  amiodarone which she stopped a couple weeks ago although I think it is probably still in his system.  I advised him to avoid long sun exposure for couple months until it is completely out of his system.  Plan:  He will be scheduled for ultrasound-guided left thoracentesis by interventional radiology.  I will plan to see him back 2 weeks later with a repeat chest x-ray.  He has a follow-up appoint with Dr. Tresa Endo in June.   Alleen Borne, MD [facility] [phone]

## 2020-11-04 ENCOUNTER — Encounter (HOSPITAL_BASED_OUTPATIENT_CLINIC_OR_DEPARTMENT_OTHER): Payer: Medicare Other | Admitting: Cardiovascular Disease

## 2020-11-05 ENCOUNTER — Other Ambulatory Visit: Payer: Self-pay | Admitting: Surgery

## 2020-11-05 ENCOUNTER — Ambulatory Visit (HOSPITAL_COMMUNITY)
Admission: RE | Admit: 2020-11-05 | Discharge: 2020-11-05 | Disposition: A | Discharge status: Home / Self Care | Payer: Medicare Other | Source: Ambulatory Visit | Attending: Surgery | Admitting: Surgery

## 2020-11-05 ENCOUNTER — Other Ambulatory Visit: Payer: Self-pay

## 2020-11-05 DIAGNOSIS — Z951 Presence of aortocoronary bypass graft: Secondary | ICD-10-CM

## 2020-11-05 DIAGNOSIS — J90 Pleural effusion, not elsewhere classified: Secondary | ICD-10-CM | POA: Insufficient documentation

## 2020-11-05 HISTORY — PX: IR THORACENTESIS ASP PLEURAL SPACE W/IMG GUIDE: IMG5380

## 2020-11-05 LAB — BODY FLUID CELL COUNT WITH DIFFERENTIAL
Eos, Fluid: 0 %
Lymphs, Fluid: 93 %
Monocyte-Macrophage-Serous Fluid: 5 % — ABNORMAL LOW (ref 50–90)
Neutrophil Count, Fluid: 2 % (ref 0–25)
Total Nucleated Cell Count, Fluid: 700 cu mm (ref 0–1000)

## 2020-11-05 MED ORDER — LIDOCAINE HCL (PF) 1 % IJ SOLN
INTRAMUSCULAR | Status: AC
  Filled 2020-11-05: qty 30

## 2020-11-05 NOTE — Procedures (Signed)
PROCEDURE SUMMARY:  Successful image-guided left thoracentesis. Yielded 575 milliliters of dark red fluid. Procedure was stopped after 575 milliliters secondary to patient's symptom (progressive lightheadedness). Patient tolerated procedure well. No immediate complications. EBL = 0 mL.  Specimen was sent for labs. CXR ordered.  Please see imaging section of Epic for full dictation.   Gordy Councilman Khamil Lamica PA-C 11/05/2020 10:16 AM

## 2020-11-06 LAB — CYTOLOGY - NON PAP

## 2020-11-09 ENCOUNTER — Encounter (HOSPITAL_BASED_OUTPATIENT_CLINIC_OR_DEPARTMENT_OTHER): Payer: Medicare Other | Admitting: Cardiovascular Disease

## 2020-11-13 ENCOUNTER — Other Ambulatory Visit: Payer: Self-pay | Admitting: Surgery

## 2020-11-13 DIAGNOSIS — Z951 Presence of aortocoronary bypass graft: Secondary | ICD-10-CM

## 2020-11-14 ENCOUNTER — Ambulatory Visit
Admission: RE | Admit: 2020-11-14 | Discharge: 2020-11-14 | Disposition: A | Discharge status: Home / Self Care | Payer: Medicare Other | Source: Ambulatory Visit | Attending: Surgery | Admitting: Surgery

## 2020-11-14 ENCOUNTER — Other Ambulatory Visit: Payer: Self-pay | Admitting: *Deleted

## 2020-11-14 ENCOUNTER — Encounter: Payer: Self-pay | Admitting: Surgery

## 2020-11-14 ENCOUNTER — Other Ambulatory Visit: Payer: Self-pay

## 2020-11-14 ENCOUNTER — Ambulatory Visit (INDEPENDENT_AMBULATORY_CARE_PROVIDER_SITE_OTHER): Payer: Self-pay | Admitting: Surgery

## 2020-11-14 VITALS — BP 116/73 | HR 82 | Resp 20 | Ht 71.0 in | Wt 141.0 lb

## 2020-11-14 DIAGNOSIS — J90 Pleural effusion, not elsewhere classified: Secondary | ICD-10-CM

## 2020-11-14 DIAGNOSIS — Z951 Presence of aortocoronary bypass graft: Secondary | ICD-10-CM

## 2020-11-14 NOTE — Progress Notes (Signed)
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 The patient returns today for follow-up of a left pleural effusion status post left thoracentesis on 11/05/2020.  He developed progressive lightheadedness and a slight drop in blood pressure so the procedure had to be terminated after 575 cc of dark red fluid was removed.  A follow-up chest x-ray showed no pneumothorax.  There is a small residual effusion remaining.  He said that his breathing was significantly better after thoracentesis.  He continues to do well at home and is ambulating going to cardiac rehab.  He is anxious to return to his job working for a [employer] home.  He continues on Lasix 40 mg daily.  He has been keeping track of his weight daily and it has been decreasing.  Current Outpatient Medications  Medication Sig Dispense Refill  . aspirin EC 325 MG EC tablet Take 1 tablet (325 mg total) by mouth daily. 30 tablet 0  . atorvastatin (LIPITOR) 40 MG tablet Take 1 tablet (40 mg total) by mouth daily. 90 tablet 3  . finasteride (PROSCAR) 5 MG tablet Take 1 tablet (5 mg total) by mouth daily. 30 tablet 1  . latanoprost (XALATAN) 0.005 % ophthalmic solution Place 1 drop into both eyes at bedtime.    . Multiple Vitamin (MULTIVITAMIN WITH MINERALS) TABS tablet Take 1 tablet by mouth daily.    . potassium chloride (KLOR-CON) 10 MEQ tablet Take 1 tablet (10 mEq total) by mouth as needed. 30 tablet 0  . potassium chloride (KLOR-CON) 10 MEQ tablet Take 1 tablet (10 mEq total) by mouth 2 (two) times daily. 60 tablet 2   No current facility-administered medications for this visit.     Physical Exam: BP 116/73   Pulse 82   Resp 20   Ht 5\' 11"  (1.803 m)   Wt 141 lb (64 kg)   SpO2 95% Comment: RA  BMI 19.67 kg/m  He looks well. Cardiac exam shows a regular rate and rhythm with normal heart sounds. Lung exam reveals slight decreased breath sounds at the left base. Chest incision is  well-healed and the sternum is stable. There is trace edema in the left ankle and mild edema in the right ankle.  This is less than it was when I saw him previously.  Diagnostic Tests:  CLINICAL DATA:  Status post coronary artery bypass grafting  EXAM: CHEST - 2 VIEW  COMPARISON:  Nov 05, 2020  FINDINGS: Persistent small left pleural effusion noted with bibasilar atelectasis. Stable mild left apical scarring. Lungs otherwise clear. Heart size and pulmonary vascularity normal. No adenopathy. Patient is status post coronary artery bypass grafting. No adenopathy. No bone lesions.  IMPRESSION: Persistent left pleural effusion with scarring left apex and bibasilar atelectasis. No edema or consolidation. Stable cardiac silhouette with postoperative changes.   Electronically Signed   By: Nov 07, 2020 III M.D.   On: 11/14/2020 14:48   Impression:  He is doing very well clinically.  His chest x-ray today shows a stable small left pleural effusion looks about the same as it did after his thoracentesis.  Is probably half is much fluid as was there prior to the thoracentesis on lateral film.  At this point do not think it is worth sending him for another thoracentesis since this is small and may resolve on its own with time.  He will continue taking Lasix 40 mg daily.  He has a follow-up appointment with Dr. 01/14/2021  on 12/12/2020 and I have ordered a repeat chest x-ray the same day for follow-up.  I told him that he did not need to see me in the office but I would check on the x-ray and let him know what it looks like.  Plan:  He will follow-up with Dr. Tresa Endo on 12/12/2020.  He will have a chest x-ray the same day and I will check the results and call him.  He does not need to return to see me in the office unless the pleural effusion increases.   Alleen Borne, MD [facility] [phone]

## 2020-12-12 ENCOUNTER — Other Ambulatory Visit: Payer: Self-pay

## 2020-12-12 ENCOUNTER — Ambulatory Visit
Admission: RE | Admit: 2020-12-12 | Discharge: 2020-12-12 | Disposition: A | Discharge status: Home / Self Care | Payer: Medicare Other | Source: Ambulatory Visit | Attending: Surgery | Admitting: Surgery

## 2020-12-12 ENCOUNTER — Encounter: Payer: Self-pay | Admitting: Cardiovascular Disease

## 2020-12-12 ENCOUNTER — Telehealth: Payer: Self-pay

## 2020-12-12 ENCOUNTER — Ambulatory Visit (INDEPENDENT_AMBULATORY_CARE_PROVIDER_SITE_OTHER): Payer: Medicare Other | Admitting: Cardiovascular Disease

## 2020-12-12 VITALS — BP 122/72 | HR 69 | Ht 71.0 in | Wt 140.6 lb

## 2020-12-12 DIAGNOSIS — I10 Essential (primary) hypertension: Secondary | ICD-10-CM

## 2020-12-12 DIAGNOSIS — M25473 Effusion, unspecified ankle: Secondary | ICD-10-CM

## 2020-12-12 DIAGNOSIS — I25810 Atherosclerosis of coronary artery bypass graft(s) without angina pectoris: Secondary | ICD-10-CM

## 2020-12-12 DIAGNOSIS — I255 Ischemic cardiomyopathy: Secondary | ICD-10-CM | POA: Diagnosis not present

## 2020-12-12 DIAGNOSIS — J90 Pleural effusion, not elsewhere classified: Secondary | ICD-10-CM

## 2020-12-12 DIAGNOSIS — I252 Old myocardial infarction: Secondary | ICD-10-CM

## 2020-12-12 DIAGNOSIS — I480 Paroxysmal atrial fibrillation: Secondary | ICD-10-CM

## 2020-12-12 DIAGNOSIS — N183 Chronic kidney disease, stage 3 unspecified: Secondary | ICD-10-CM

## 2020-12-12 DIAGNOSIS — Z951 Presence of aortocoronary bypass graft: Secondary | ICD-10-CM

## 2020-12-12 DIAGNOSIS — D649 Anemia, unspecified: Secondary | ICD-10-CM

## 2020-12-12 NOTE — Progress Notes (Signed)
Cardiology Office Note    Date:  12/12/2020   ID:  Paul [name], Paul [name] 1941/04/17, MRN [id number]  PCP:  [name], MD  Cardiologist:  [name], MD   Initial office with me following his March 2022 hospitalization and CABG revascularization.   History of Present Illness:  Paul [name] is a 80 y.o. male who is from [place].  I saw him when he was admitted to [facility] on August 18, 2020.  At that time he had a 21-monthhistory of episodic exertionally precipitated chest discomfort.  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  [facility]  I recommended he undergo a cardiac MRI to assess for LV viability.  A 2D echo Doppler study showed reduced LV function with EF at 30 to 35%.  He was found to have grade 3 diastolic dysfunction and significant increased pulmonary artery systolic pressure at 70 mm with moderate tricuspid regurgitation.  He ultimately underwent CABG revascularization surgery x3 by Dr. BArvid Rightand had a LIMA graft placed to the OM1, SVG to OM 2, and SVG to the posterolateral vessel on August 23, 2021.  He returned to the OR the following day for expectation of being studied for bleeding.  Postoperative course was complicated by volume overload and PAF with RVR for which she was placed on amiodarone therapy.  He also required midodrine for hypotension.  He was subsequently seen for his initial hospital visit by [name] PA on September 11, 2020.  The patient has been seen by Dr. BCyndia Bentand had developed a left pleural effusion and required left thoracentesis on Nov 05, 2020.  575 cc of dark red fluid was removed.  Follow-up chest x-ray did not reveal any pneumothorax.  There was a small residual effusion which remained.  It was recommended he continue Lasix 40 mg daily.  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  He denies any chest pain or shortness of breath.  [place]  At times he continues to experience some lower extremity edema around his ankles.  He is unaware of palpitations.  His breathing has improved.  He presents for initial evaluation with me.    Past Medical History:  Diagnosis Date   Glaucoma    High blood pressure    High cholesterol     Past Surgical History:  Procedure Laterality Date   CARDIAC CATHETERIZATION  08/19/2020   CHEST EXPLORATION N/A 08/23/2020   Procedure: RE- EXPLORATION POST CABG;  Surgeon: BGaye Pollack MD;  Location: [facility] OR;  Service: Thoracic;  Laterality: N/A;   CORONARY ARTERY BYPASS GRAFT N/A 08/23/2020   Procedure: CORONARY ARTERY BYPASS GRAFTING (CABG) X   THREE , LEFT INTERNAL MAMMARY HARVEST, RIGHT LEG ENDOSCOPIC SAPHENOUS VEIN HARVESTING, LEFT ENDOSCOPIC SAPHENOUS VEIN  HARVEST;  Surgeon: Gaye Pollack, MD;  Location: [place];  Service: Open Heart Surgery;  Laterality: N/A;   IR THORACENTESIS ASP PLEURAL SPACE W/IMG GUIDE  11/05/2020   TEE WITHOUT CARDIOVERSION N/A 08/23/2020   Procedure: TRANSESOPHAGEAL ECHOCARDIOGRAM (TEE);  Surgeon: Gaye Pollack, MD;  Location: [place];  Service: Open Heart Surgery;  Laterality: N/A;   WRIST FRACTURE SURGERY Right    "years ago"    Current Medications: Outpatient  Medications Prior to Visit  Medication Sig Dispense Refill   aspirin EC 325 MG EC tablet Take 1 tablet (325 mg total) by mouth daily. 30 tablet 0   atorvastatin (LIPITOR) 40 MG tablet Take 1 tablet (40 mg total) by mouth daily. 90 tablet 3   finasteride (PROSCAR) 5 MG tablet Take 1 tablet (5 mg total) by mouth daily. 30 tablet 1   furosemide (LASIX) 20 MG tablet Take 20 mg by mouth 2 (two) times daily.     latanoprost (XALATAN) 0.005 % ophthalmic solution Place 1 drop into both eyes at bedtime.     Multiple Vitamin (MULTIVITAMIN WITH MINERALS) TABS tablet Take 1 tablet by mouth daily.     potassium chloride (KLOR-CON) 10 MEQ tablet Take 1 tablet (10 mEq total) by mouth 2 (two) times daily. 60 tablet 2   potassium chloride (KLOR-CON) 10 MEQ tablet Take 1 tablet (10 mEq total) by mouth as needed. 30 tablet 0   No facility-administered medications prior to visit.     Allergies:   Patient has no known allergies.   Social History   Socioeconomic History   Marital status: Widowed    Spouse name: Not on file   Number of children: Not on file   Years of education: Not on file   Highest education level: Not on file  Occupational History   Not on file  Tobacco Use   Smoking status: Never   Smokeless tobacco: Never  Substance and Sexual Activity   Alcohol use: Not Currently   Drug use: Never   Sexual activity: Not on file  Other Topics Concern   Not on file  Social History Narrative   Not on file   Social Determinants of Health   Financial Resource Strain: Not on file  Food Insecurity: Not on file  Transportation Needs: Not on file  Physical Activity: Not on file  Stress: Not on file  Social Connections: Not on file     Family History:  The patient's family history is not on file.  Parents are deceased.  ROS General: Negative; No fevers, chills, or night sweats;  HEENT: Negative; No changes in vision or hearing, sinus congestion, difficulty swallowing Pulmonary: Negative; No  cough, wheezing, shortness of breath, hemoptysis Cardiovascular: Negative; No chest pain, presyncope, syncope, palpitations GI: Negative; No nausea, vomiting, diarrhea, or abdominal pain GU: Negative; No dysuria, hematuria, or difficulty voiding Musculoskeletal: Negative; no myalgias, joint pain, or weakness Hematologic/Oncology: Negative; no easy bruising, bleeding Endocrine: Negative; no heat/cold intolerance; no diabetes Neuro: Negative; no changes in balance, headaches Skin: Negative; No rashes or skin lesions Psychiatric: Negative; No behavioral problems, depression Sleep: Negative; No snoring, daytime sleepiness, hypersomnolence, bruxism, restless legs, hypnogognic hallucinations, no cataplexy Other comprehensive 14 point system review is negative.   PHYSICAL EXAM:   VS:  BP 122/72   Pulse 69   Ht _0  (1.803 m)   Wt 140 lb 9.6 oz (63.8 kg)   SpO2 97%   BMI 19.61 kg/m     Repeat blood pressure by  me is 128/70 Wt Readings from Last 3 Encounters:  12/12/20 140 lb 9.6 oz (63.8 kg)  11/14/20 141 lb (64 kg)  10/31/20 146 lb 3.2 oz (66.3 kg)    General: Alert, oriented, no distress.  Skin: normal turgor, no rashes, warm and dry HEENT: Normocephalic, atraumatic. Pupils equal round and reactive to light; sclera anicteric; extraocular muscles intact;  Nose without nasal septal hypertrophy Mouth/Parynx benign; Mallinpatti scale 2 Neck: No JVD, no carotid bruits; normal carotid upstroke Lungs: clear to ausculatation and percussion; no wheezing or rales Chest wall: without tenderness to palpitation Heart: PMI not displaced, RRR, s1 s2 normal, 1/6 systolic murmur, no diastolic murmur, no rubs, gallops, thrills, or heaves Abdomen: soft, nontender; no hepatosplenomehaly, BS+; abdominal aorta nontender and not dilated by palpation. Back: no CVA tenderness Pulses 2+ Musculoskeletal: full range of motion, normal strength, no joint deformities Extremities: Trace Bilateral ankle edema,   no clubbing cyanosis Homan's sign negative  Neurologic: grossly nonfocal; Cranial nerves grossly wnl Psychologic: Normal mood and affect   Studies/Labs Reviewed:   EKG:  EKG is ordered today.  ECG (independently read by me): Normal sinus rhythm at 69 bpm.  Evidence for prior anteroseptal MI with QS complex V1 through V3.  Mild T wave abnormalities.  Normal intervals.  No ectopy  Recent Labs: BMP Latest Ref Rng & Units 10/31/2020 09/11/2020 09/01/2020  Glucose 65 - 99 mg/dL 78 110(H) 141(H)  BUN 7 - 25 mg/dL 21 24 26(H)  Creatinine 0.70 - 1.18 mg/dL 1.48(H) 1.45(H) 1.38(H)  BUN/Creat Ratio 6 - 22 (calc) 14 17 -  Sodium 135 - 146 mmol/L 138 137 134(L)  Potassium 3.5 - 5.3 mmol/L 4.1 5.1 4.6  Chloride 98 - 110 mmol/L 98 99 94(L)  CO2 20 - 32 mmol/L 32 23 30  Calcium 8.6 - 10.3 mg/dL 8.9 9.2 9.0     Hepatic Function Latest Ref Rng & Units 09/01/2020 08/19/2020  Total Protein 6.5 - 8.1 g/dL 6.7 6.4(L)  Albumin 3.5 - 5.0 g/dL 3.2(L) 3.0(L)  AST 15 - 41 U/L 17 17  ALT 0 - 44 U/L 14 20  Alk Phosphatase 38 - 126 U/L 61 53  Total Bilirubin 0.3 - 1.2 mg/dL 1.3(H) 0.8    CBC Latest Ref Rng & Units 09/11/2020 09/01/2020 08/27/2020  WBC 3.4 - 10.8 x10E3/uL 6.9 14.8(H) 11.6(H)  Hemoglobin 13.0 - 17.7 g/dL 10.4(L) 10.0(L) 8.3(L)  Hematocrit 37.5 - 51.0 % 31.8(L) 32.2(L) 24.8(L)  Platelets 150 - 450 x10E3/uL 409 412(H) 163   Lab Results  Component Value Date   MCV 96 09/11/2020   MCV 104.2 (H) 09/01/2020   MCV 99.2 08/27/2020   Lab Results  Component Value Date   TSH 3.177 08/19/2020   Lab Results  Component Value Date   HGBA1C 6.2 (H) 08/19/2020     BNP    Component Value Date/Time   BNP 1,109.1 (H) 08/19/2020 0358    ProBNP No results found for: PROBNP   Lipid Panel     Component Value Date/Time   CHOL 105 08/19/2020 0358   TRIG 82 08/19/2020 0358   HDL 41 08/19/2020 0358   CHOLHDL 2.6 08/19/2020 0358   VLDL 16 08/19/2020 0358   LDLCALC 48 08/19/2020 0358      RADIOLOGY: DG Chest 2 View  Result Date: 12/12/2020 CLINICAL DATA:  Follow-up pleural effusion EXAM: CHEST - 2 VIEW COMPARISON:  11/14/2020 FINDINGS: Cardiac shadow is stable. Postsurgical changes are again seen. Decreased blunting of left costophrenic angle is noted when  compared with the prior exam. No new focal abnormality is noted. IMPRESSION: Improving left pleural effusion when compared with the prior study. Electronically Signed   By: Inez Catalina M.D.   On: 12/12/2020 14:14   DG Chest 2 View  Result Date: 11/14/2020 CLINICAL DATA:  Status post coronary artery bypass grafting EXAM: CHEST - 2 VIEW COMPARISON:  Nov 05, 2020 FINDINGS: Persistent small left pleural effusion noted with bibasilar atelectasis. Stable mild left apical scarring. Lungs otherwise clear. Heart size and pulmonary vascularity normal. No adenopathy. Patient is status post coronary artery bypass grafting. No adenopathy. No bone lesions. IMPRESSION: Persistent left pleural effusion with scarring left apex and bibasilar atelectasis. No edema or consolidation. Stable cardiac silhouette with postoperative changes. Electronically Signed   By: Lowella Grip III M.D.   On: 11/14/2020 14:48     Additional studies/ records that were reviewed today include:    LHC/CA: 08/18/2020 Hemodynamics: Aortic pressure 91/46, LV pressure 91, LVEDP 36mHg LM: distal 50% stenosis LAD: proximal 75-80% stenosis. Mid LAD subtotal stenosis with faint filling, finally TIMI-2 filling of distal LAD. - Diagonal 1 25-30% stenosis, not a large vessel - Diagonal 2 ostial 8% stenosis LCX: mid vessel with 99% stenosis with ulceration - OM1: proximal 75% stenosis - OM2: small vessel RCA: dominant vessel. Mid RCA with 99% stenosis, jump bridging collaterals which were right -right collaterals. RCA collateralized LAD  Echo 08/19/2020  1. Left ventricular ejection fraction, by estimation, is 25 to 30%. The  left ventricle has severely decreased  function.   2. The left ventricle demonstrates regional wall motion abnormalities .  All mid-to-apical septal, mid-to-apical anterolateral, mid-to-apical  anterior, apical inferior and apex appear akinetic. The rest of the LV  walls are hypokinetic.   3. There is mild concentric left ventricular hypertrophy. Left  ventricular diastolic parameters are consistent with Grade II diastolic  dysfunction (pseudonormalization).   4. Swirling of contrast seen in the left ventricle consistent with low  flow state. No LV thrombus visualized.   5. Right ventricular systolic function is normal. The right ventricular  size is normal.   6. The mitral valve is abnormal. Trivial mitral valve regurgitation. No  evidence of mitral stenosis. Moderate mitral annular calcification.   7. The aortic valve has an indeterminant number of cusps. There is  moderate calcification of the aortic valve. There is moderate thickening  of the aortic valve. Aortic valve regurgitation is not visualized. Mild  aortic valve stenosis. Gradients low due  to low output state.   8. Wall motion concerning for multivessel coronary artery disease.     ASSESSMENT:    1. 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 Ischemic cardiomyopathy   3. Primary hypertension   4. History of MI (myocardial infarction)   5. Ankle edema   6. Paroxysmal atrial fibrillation ([place]: [post op 08/2020   7. Anemia, unspecified type   8. Stage 3 chronic kidney disease, unspecified whether stage 3a or 3b CKD ([place]    PLAN:  Mr. WFavian Kittlesonis a very pleasant 80year old gentleman who had developed progressive exertional shortness of breath leading to hypoxia [facility] [place] [facility]  Initial BNP was 1255.  CTA of his chest was negative for PE but showed pulmonary edema with bilateral effusions.  He underwent cardiac catheterization on  August 18, 2020 which showed severe multivessel CAD and ultimately underwent successful CABG revascularization surgery  by Dr. [name] on August 23, 2020.  His postop course was complicated by atrial fibrillation with RVR for which he required amiodarone.  He also developed bilateral effusions and ultimately required subsequent thoracentesis in May 2022.  Presently he feels improved.  He denies any chest tightness.  His shortness of breath has improved.  He continues to experience ankle swelling and has been on furosemide 40 mg twice a day.  He has continued to be on atorvastatin at just 40 mg daily and has been taking supplemental potassium 10 mill equivalents with his furosemide.  He is unaware of any recurrent atrial fibrillation.  His ECG shows sinus rhythm at 69 bpm with QS complex consistent with anteroseptal myocardial infarction.  With his mild leg edema at presently I have recommended he continue furosemide 20 mg twice a day.  He will be undergoing a follow-up PA and lateral chest x-ray today to reassess his pleural effusion which hopefully has not recurred.  On exam his lungs are clear and the was not any blunting of his costophrenic angles.  I have recommended he initiate support stockings which would be beneficial.  I am recommending he have a follow-up echo Doppler study to reassess LV systolic and diastolic function as well as pulmonary pressures following his successful coronary revascularization.  I will contact him regarding his chest x-ray result.  Laboratory from March 30 was reviewed which showed him to be anemic with a hemoglobin of 10.4 hematocrit 31.8.  There was stage III CKD with creatinine 1.45. I am also recommending follow-up fasting laboratory with a comprehensive metabolic panel, CBC, TSH and lipid studies.  He is currently participating in cardiac rehab.    I will see him in 6 to 8 weeks for follow-up evaluation or sooner as needed   Medication Adjustments/Labs and Tests  Ordered: Current medicines are reviewed at length with the patient today.  Concerns regarding medicines are outlined above.  Medication changes, Labs and Tests ordered today are listed in the Patient Instructions below. Patient Instructions  Medication Instructions:  Your physician recommends that you continue on your current medications as directed. Please refer to the Current Medication list given to you today.  *If you need a refill on your cardiac medications before your next appointment, please call your pharmacy*   Lab Work: Cmet, Lipid, TSH. CBC - FASTING If you have labs (blood work) drawn today and your tests are completely normal, you will receive your results only by: [place] (if you have MyChart) OR A paper copy in the mail If you have any lab test that is abnormal or we need to change your treatment, we will call you to review the results.   Testing/Procedures: Your physician has requested that you have an echocardiogram. Echocardiography is a painless test that uses sound waves to create images of your heart. It provides your doctor with information about the size and shape of your heart and how well your heart's chambers and valves are working. This procedure takes approximately one hour. There are no restrictions for this procedure.    Follow-Up: At [facility], you and your health needs are our priority.  As part of our continuing mission to provide you with exceptional heart care, we have created designated Provider Care Teams.  These Care Teams include your primary Cardiologist (physician) and Advanced Practice Providers (APPs -  Physician Assistants and Nurse Practitioners) who all work together to provide you with the care you need, when you need it.  We recommend  signing up for the patient portal called "MyChart".  Sign up information is provided on this After Visit Summary.  MyChart is used to connect with patients for Virtual Visits (Telemedicine).   Patients are able to view lab/test results, encounter notes, upcoming appointments, etc.  Non-urgent messages can be sent to your provider as well.   To learn more about what you can do with MyChart, go to [email or web address].    Your next appointment:   6-8 week(s)  The format for your next appointment:   In Person  Provider:   [name], MD  Other instructions: Dr. Claiborne Billings recommends getting support stockings to help with swelling in the legs.       Signed, [name], MD  12/12/2020 2:47 PM    [facility] [address] [place], [place], [place]  [address] Phone: [phone]

## 2020-12-12 NOTE — Telephone Encounter (Signed)
This RN called patient to go over patient instructions from the [employer], as the [employer] office lost power. This RN reviewed instructions for echocardiogram and fasting labs, patient verbalized understanding. This RN to send a copy of AVS to patient through the mail per pt request.

## 2020-12-12 NOTE — Patient Instructions (Addendum)
Medication Instructions:  Your physician recommends that you continue on your current medications as directed. Please refer to the Current Medication list given to you today.  *If you need a refill on your cardiac medications before your next appointment, please call your pharmacy*   Lab Work: Cmet, Lipid, TSH. CBC - FASTING If you have labs (blood work) drawn today and your tests are completely normal, you will receive your results only by: MyChart Message (if you have MyChart) OR A paper copy in the mail If you have any lab test that is abnormal or we need to change your treatment, we will call you to review the results.   Testing/Procedures: Your physician has requested that you have an echocardiogram. Echocardiography is a painless test that uses sound waves to create images of your heart. It provides your doctor with information about the size and shape of your heart and how well your heart's chambers and valves are working. This procedure takes approximately one hour. There are no restrictions for this procedure.    Follow-Up: At [facility], you and your health needs are our priority.  As part of our continuing mission to provide you with exceptional heart care, we have created designated Provider Care Teams.  These Care Teams include your primary Cardiologist (physician) and Advanced Practice Providers (APPs -  Physician Assistants and Nurse Practitioners) who all work together to provide you with the care you need, when you need it.  We recommend signing up for the patient portal called "MyChart".  Sign up information is provided on this After Visit Summary.  MyChart is used to connect with patients for Virtual Visits (Telemedicine).  Patients are able to view lab/test results, encounter notes, upcoming appointments, etc.  Non-urgent messages can be sent to your provider as well.   To learn more about what you can do with MyChart, go to [email or web address].    Your next  appointment:   6-8 week(s)  The format for your next appointment:   In Person  Provider:   Nicki Guadalajara, MD  Other instructions: Dr. Tresa Endo recommends getting support stockings to help with swelling in the legs.

## 2020-12-17 ENCOUNTER — Telehealth: Payer: Self-pay | Admitting: Cardiovascular Disease

## 2020-12-17 NOTE — Telephone Encounter (Signed)
*  STAT* If patient is at the pharmacy, call can be transferred to refill team.   1. Which medications need to be refilled? (please list name of each medication and dose if known) furosemide (LASIX) 20 MG tablet  2. Which pharmacy/location (including street and city if local pharmacy) is medication to be sent to? [id number] - [place], [place] - [address]  3. Do they need a 30 day or 90 day supply? 90

## 2020-12-19 MED ORDER — FUROSEMIDE 20 MG PO TABS: 20.0000 mg | ORAL_TABLET | Freq: Two times a day (BID) | ORAL | 3 refills | Status: DC

## 2020-12-19 NOTE — Telephone Encounter (Signed)
Daughter called to follow up on this refill request. She said [facility] usually texts her when the medication is ready but she has not gotten a text yet. She does not want the patient to run out

## 2020-12-19 NOTE — Addendum Note (Signed)
Addended by: Brunetta Genera on: 12/19/2020 01:07 PM   Modules accepted: Orders

## 2021-01-02 ENCOUNTER — Other Ambulatory Visit: Payer: Self-pay

## 2021-01-02 ENCOUNTER — Ambulatory Visit (HOSPITAL_COMMUNITY): Payer: Medicare Other | Attending: Cardiology

## 2021-01-02 ENCOUNTER — Other Ambulatory Visit: Payer: Medicare Other

## 2021-01-02 DIAGNOSIS — I252 Old myocardial infarction: Secondary | ICD-10-CM | POA: Diagnosis not present

## 2021-01-02 LAB — ECHOCARDIOGRAM COMPLETE
Area-P 1/2: 3.79 cm2
S' Lateral: 4.2 cm

## 2021-01-02 MED ORDER — PERFLUTREN LIPID MICROSPHERE
1.0000 mL | INTRAVENOUS | Status: AC | PRN
  Administered 2021-01-02: 1 mL via INTRAVENOUS

## 2021-01-04 LAB — CBC
Hematocrit: 40.5 % (ref 37.5–51.0)
Hemoglobin: 13.4 g/dL (ref 13.0–17.7)
MCH: 28.7 pg (ref 26.6–33.0)
MCHC: 33.1 g/dL (ref 31.5–35.7)
MCV: 87 fL (ref 79–97)
Platelets: 238 10*3/uL (ref 150–450)
RBC: 4.67 x10E6/uL (ref 4.14–5.80)
RDW: 14.1 % (ref 11.6–15.4)
WBC: 7.1 10*3/uL (ref 3.4–10.8)

## 2021-01-04 LAB — COMPREHENSIVE METABOLIC PANEL
ALT: 14 IU/L (ref 0–44)
AST: 18 IU/L (ref 0–40)
Albumin/Globulin Ratio: 1.1 — ABNORMAL LOW (ref 1.2–2.2)
Albumin: 3.9 g/dL (ref 3.7–4.7)
Alkaline Phosphatase: 98 IU/L (ref 44–121)
BUN/Creatinine Ratio: 17 (ref 10–24)
BUN: 26 mg/dL (ref 8–27)
Bilirubin Total: 0.6 mg/dL (ref 0.0–1.2)
CO2: 27 mmol/L (ref 20–29)
Calcium: 9.3 mg/dL (ref 8.6–10.2)
Chloride: 100 mmol/L (ref 96–106)
Creatinine, Ser: 1.49 mg/dL — ABNORMAL HIGH (ref 0.76–1.27)
Globulin, Total: 3.5 g/dL (ref 1.5–4.5)
Glucose: 96 mg/dL (ref 65–99)
Potassium: 4.5 mmol/L (ref 3.5–5.2)
Sodium: 141 mmol/L (ref 134–144)
Total Protein: 7.4 g/dL (ref 6.0–8.5)
eGFR: 47 mL/min/{1.73_m2} — ABNORMAL LOW (ref >59)

## 2021-01-04 LAB — LIPID PANEL
Chol/HDL Ratio: 2.5 ratio (ref 0.0–5.0)
Cholesterol, Total: 107 mg/dL (ref 100–199)
HDL: 42 mg/dL (ref >39)
LDL Chol Calc (NIH): 46 mg/dL (ref 0–99)
Triglycerides: 98 mg/dL (ref 0–149)
VLDL Cholesterol Cal: 19 mg/dL (ref 5–40)

## 2021-01-04 LAB — TSH: TSH: 3.79 u[IU]/mL (ref 0.450–4.500)

## 2021-01-11 ENCOUNTER — Other Ambulatory Visit: Payer: Self-pay | Admitting: Surgery

## 2021-01-14 ENCOUNTER — Telehealth: Payer: Self-pay | Admitting: Cardiovascular Disease

## 2021-01-14 DIAGNOSIS — I10 Essential (primary) hypertension: Secondary | ICD-10-CM

## 2021-01-14 MED ORDER — POTASSIUM CHLORIDE ER 10 MEQ PO TBCR: 10.0000 meq | EXTENDED_RELEASE_TABLET | Freq: Two times a day (BID) | ORAL | 2 refills | Status: DC

## 2021-01-14 NOTE — Telephone Encounter (Signed)
   *  STAT* If patient is at the pharmacy, call can be transferred to refill team.   1. Which medications need to be refilled? (please list name of each medication and dose if known) potassium chloride (KLOR-CON) 10 MEQ tablet  2. Which pharmacy/location (including street and city if local pharmacy) is medication to be sent to? [id number] - [place], [place] - [address]  3. Do they need a 30 day or 90 day supply? 30 days   Per pt's daughter, Dr. Laneta Simmers told her to call Dr. Tresa Endo for refill for potassium

## 2021-01-17 ENCOUNTER — Other Ambulatory Visit: Payer: Self-pay | Admitting: Surgery

## 2021-01-17 ENCOUNTER — Other Ambulatory Visit: Payer: Self-pay

## 2021-01-17 ENCOUNTER — Telehealth: Payer: Self-pay | Admitting: Cardiovascular Disease

## 2021-01-17 DIAGNOSIS — I10 Essential (primary) hypertension: Secondary | ICD-10-CM

## 2021-01-17 MED ORDER — POTASSIUM CHLORIDE ER 10 MEQ PO TBCR: 10.0000 meq | EXTENDED_RELEASE_TABLET | Freq: Two times a day (BID) | ORAL | 2 refills | Status: DC

## 2021-01-17 NOTE — Telephone Encounter (Signed)
*  STAT* If patient is at the pharmacy, call can be transferred to refill team.   1. Which medications need to be refilled? (please list name of each medication and dose if known)  potassium chloride (KLOR-CON) 10 MEQ tablet 2. Which pharmacy/location (including street and city if local pharmacy) is medication to be sent to?  [id number] - [place], [place] - [address] 3. Do they need a 30 day or 90 day supply? 30 day supply

## 2021-01-20 ENCOUNTER — Encounter: Payer: Self-pay | Admitting: Cardiovascular Disease

## 2021-01-20 ENCOUNTER — Ambulatory Visit (INDEPENDENT_AMBULATORY_CARE_PROVIDER_SITE_OTHER): Payer: Medicare Other | Admitting: Cardiovascular Disease

## 2021-01-20 ENCOUNTER — Other Ambulatory Visit: Payer: Self-pay

## 2021-01-20 VITALS — BP 142/72 | HR 76 | Ht 71.0 in | Wt 146.6 lb

## 2021-01-20 DIAGNOSIS — I480 Paroxysmal atrial fibrillation: Secondary | ICD-10-CM

## 2021-01-20 DIAGNOSIS — Z79899 Other long term (current) drug therapy: Secondary | ICD-10-CM | POA: Diagnosis not present

## 2021-01-20 DIAGNOSIS — I255 Ischemic cardiomyopathy: Secondary | ICD-10-CM | POA: Diagnosis not present

## 2021-01-20 DIAGNOSIS — I5189 Other ill-defined heart diseases: Secondary | ICD-10-CM

## 2021-01-20 DIAGNOSIS — I25810 Atherosclerosis of coronary artery bypass graft(s) without angina pectoris: Secondary | ICD-10-CM | POA: Diagnosis not present

## 2021-01-20 DIAGNOSIS — E785 Hyperlipidemia, unspecified: Secondary | ICD-10-CM

## 2021-01-20 DIAGNOSIS — N1831 Chronic kidney disease, stage 3a: Secondary | ICD-10-CM

## 2021-01-20 DIAGNOSIS — I252 Old myocardial infarction: Secondary | ICD-10-CM

## 2021-01-20 DIAGNOSIS — M25473 Effusion, unspecified ankle: Secondary | ICD-10-CM

## 2021-01-20 DIAGNOSIS — J90 Pleural effusion, not elsewhere classified: Secondary | ICD-10-CM

## 2021-01-20 MED ORDER — METOPROLOL SUCCINATE ER 25 MG PO TB24: 12.5000 mg | ORAL_TABLET | Freq: Every day | ORAL | 3 refills | Status: DC

## 2021-01-20 MED ORDER — ENTRESTO 24-26 MG PO TABS: 1.0000 | ORAL_TABLET | Freq: Two times a day (BID) | ORAL | 4 refills | Status: DC

## 2021-01-20 MED ORDER — ASPIRIN EC 81 MG PO TBEC: 81.0000 mg | DELAYED_RELEASE_TABLET | Freq: Every day | ORAL | 3 refills | Status: AC

## 2021-01-20 MED ORDER — FUROSEMIDE 20 MG PO TABS: 20.0000 mg | ORAL_TABLET | Freq: Every day | ORAL | 3 refills | Status: DC

## 2021-01-20 NOTE — Patient Instructions (Signed)
Medication Instructions:   Start taking  Entresto  24/26 mg  one tablet twice a day   Stop taking Potassium ( k-dur) Stop taking aspirin 325 mg Stop taking Lasix ( furosemide) 20 mg twice a day   Start taking Lasix  20 mg  one tablet daily  Start taking Aspirin 81 mg  one tablet daily - purchase over the counter.  Start taking Metoprolol succinate 12.5 mg ( 1/2 tablet of 25 mg) daily    *If you need a refill on your cardiac medications before your next appointment, please call your pharmacy*   Lab Work:  [facility]- BNP- in 2 -3 weeks   If you have labs (blood work) drawn today and your tests are completely normal, you will receive your results only by: MyChart Message (if you have MyChart) OR A paper copy in the mail If you have any lab test that is abnormal or we need to change your treatment, we will call you to review the results.   Testing/Procedures: Not needed   Follow-Up: At [facility], you and your health needs are our priority.  As part of our continuing mission to provide you with exceptional heart care, we have created designated Provider Care Teams.  These Care Teams include your primary Cardiologist (physician) and Advanced Practice Providers (APPs -  Physician Assistants and Nurse Practitioners) who all work together to provide you with the care you need, when you need it.  We recommend signing up for the patient portal called "MyChart".  Sign up information is provided on this After Visit Summary.  MyChart is used to connect with patients for Virtual Visits (Telemedicine).  Patients are able to view lab/test results, encounter notes, upcoming appointments, etc.  Non-urgent messages can be sent to your provider as well.   To learn more about what you can do with MyChart, go to [email or web address].    Your next appointment:   2 month(s)  The format for your next appointment:   In Person  Provider:   Nicki Guadalajara, MD  Your physician recommends that you  schedule a follow-up appointment in 3-4 weeks with [facility]- FOR TITRATION OF ENTRESTO

## 2021-01-23 ENCOUNTER — Telehealth: Payer: Self-pay

## 2021-01-23 NOTE — Telephone Encounter (Signed)
Called and spoke w/pts daughter and stated that labs are needed for 8/22 appt and she voiced understanding and that she will have him complete them asap

## 2021-01-27 ENCOUNTER — Encounter: Payer: Self-pay | Admitting: Cardiovascular Disease

## 2021-01-27 NOTE — Progress Notes (Signed)
Cardiology Office Note    Date:  01/27/2021   ID:  Paul [name], DOB 1940/11/09, MRN [id number]  PCP:  [name], MD  Cardiologist:  [name], MD   6 week follow-up office visit   History of Present Illness:  Paul [name] is a 80 y.o. male who is from [place].  I saw him when he was admitted to [facility] on August 18, 2020.  At that time he had a 41-monthhistory of episodic exertionally precipitated chest discomfort.  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  [facility]  I recommended he undergo a cardiac MRI to assess for LV viability.  A 2D echo Doppler study showed reduced LV function with EF at 30 to 35%.  He was found to have grade 3 diastolic dysfunction and significant increased pulmonary artery systolic pressure at 70 mm with moderate tricuspid regurgitation.  He ultimately underwent CABG revascularization surgery x3 by Dr. BArvid Rightand had a LIMA graft placed to the OM1, SVG to OM 2, and SVG to the posterolateral vessel on August 23, 2021.  He returned to the OR the following day for expectation of being studied for bleeding.  Postoperative course was complicated by volume overload and PAF with RVR for which she was placed on amiodarone therapy.  He also required midodrine for hypotension.  He was subsequently seen for his initial hospital visit by [name] PA on September 11, 2020.  The patient has been seen by Dr. BCyndia Bentand had  developed a left pleural effusion and required left thoracentesis on Nov 05, 2020.  575 cc of dark red fluid was removed.  Follow-up chest x-ray did not reveal any pneumothorax.  There was a small residual effusion which remained.  It was recommended he continue Lasix 40 mg daily.  I saw him for my initial evaluation with me follow his hospitalization on December 12, 2020.  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  He denied any recurrence of chest pain or shortness of breath and [place]  At times he had experienced lower extremity edema around his ankles.  He was unaware of palpitations.  His breathing has improved.  His ECG showed sinus rhythm with QS complex consistent with his anteroseptal myocardial infarction.  With his leg edema I suggested he continue furosemide 20 mg twice a day.  I recommended he undergo a follow-up echo Doppler study to reassess LV systolic and diastolic function as well as pulmonary pressures.  He had stage III CKD with creatinine 1.45 and follow-up laboratory was recommended.  He underwent a follow-up chest x-ray following his office visit which showed improvement in his left pleural effusion compared to his prior November 15, 2019 evaluation.  On January 02, 2021 he underwent a follow-up  echo Doppler study.  EF continues to be severely reduced at 25 to 30% with grade 3 diastolic dysfunction consistent with restrictive physiology.  He had elevated left atrial pressure.  There was mild mitral regurgitation and mild to moderate aortic sclerosis without stenosis.  Estimated RV systolic pressure was 94.1 mm.  He continues to participate in cardiac rehab.  He denies any chest pain.  He presents for evaluation.  Past Medical History:   Diagnosis Date   Glaucoma    High blood pressure    High cholesterol     Past Surgical History:  Procedure Laterality Date   CARDIAC CATHETERIZATION  08/19/2020   CHEST EXPLORATION N/A 08/23/2020   Procedure: RE- EXPLORATION POST CABG;  Surgeon: Gaye Pollack, MD;  Location: [facility] OR;  Service: Thoracic;  Laterality: N/A;   CORONARY ARTERY BYPASS GRAFT N/A 08/23/2020   Procedure: CORONARY ARTERY BYPASS GRAFTING (CABG) X   THREE , LEFT INTERNAL MAMMARY HARVEST, RIGHT LEG ENDOSCOPIC SAPHENOUS VEIN HARVESTING, LEFT ENDOSCOPIC SAPHENOUS VEIN HARVEST;  Surgeon: Gaye Pollack, MD;  Location: [facility];  Service: Open Heart Surgery;  Laterality: N/A;   IR THORACENTESIS ASP PLEURAL SPACE W/IMG GUIDE  11/05/2020   TEE WITHOUT CARDIOVERSION N/A 08/23/2020   Procedure: TRANSESOPHAGEAL ECHOCARDIOGRAM (TEE);  Surgeon: Gaye Pollack, MD;  Location: [place];  Service: Open Heart Surgery;  Laterality: N/A;   WRIST FRACTURE SURGERY Right    "years ago"    Current Medications: Outpatient Medications Prior to Visit  Medication Sig Dispense Refill   atorvastatin (LIPITOR) 40 MG tablet Take 1 tablet (40 mg total) by mouth daily. 90 tablet 3   finasteride (PROSCAR) 5 MG tablet Take 1 tablet (5 mg total) by mouth daily. 30 tablet 1   latanoprost (XALATAN) 0.005 % ophthalmic solution Place 1 drop into both eyes at bedtime.     Multiple Vitamin (MULTIVITAMIN WITH MINERALS) TABS tablet Take 1 tablet by mouth daily.     aspirin EC 325 MG EC tablet Take 1 tablet (325 mg total) by mouth daily. 30 tablet 0   furosemide (LASIX) 20 MG tablet Take 1 tablet (20 mg total) by mouth 2 (two) times daily. 180 tablet 3   potassium chloride (KLOR-CON) 10 MEQ tablet Take 1 tablet (10 mEq total) by mouth 2 (two) times daily. 60 tablet 2   No facility-administered medications prior to visit.     Allergies:   Patient has no known allergies.   Social History   Socioeconomic History   Marital status: Widowed    Spouse name: Not  on file   Number of children: Not on file   Years of education: Not on file   Highest education level: Not on file  Occupational History   Not on file  Tobacco Use   Smoking status: Never   Smokeless tobacco: Never  Substance and Sexual Activity   Alcohol use: Not Currently   Drug use: Never   Sexual activity: Not on file  Other Topics Concern   Not on file  Social History Narrative   Not on file   Social Determinants of Health   Financial Resource Strain: Not on file  Food Insecurity: Not on file  Transportation Needs: Not on file  Physical Activity: Not on file  Stress: Not on file  Social Connections: Not on file     Family History:  The patient's family history is not on file.  Parents are deceased.  ROS General: Negative; No fevers, chills, or night sweats;  HEENT: Negative; No changes in vision or hearing, sinus congestion, difficulty swallowing Pulmonary: Negative; No cough, wheezing, shortness of breath, hemoptysis Cardiovascular: See HPI GI: Negative; No nausea, vomiting, diarrhea, or abdominal pain GU: Negative; No dysuria, hematuria, or difficulty voiding Musculoskeletal: Negative; no myalgias, joint pain, or weakness Hematologic/Oncology: Negative; no easy bruising, bleeding Endocrine: Negative; no heat/cold intolerance; no diabetes Neuro: Negative; no changes in balance, headaches Skin: Negative; No rashes or skin lesions Psychiatric: Negative; No behavioral problems, depression Sleep: Negative; No snoring, daytime sleepiness, hypersomnolence, bruxism, restless legs, hypnogognic hallucinations, no cataplexy Other comprehensive 14 point system review is negative.   PHYSICAL EXAM:   VS:  BP (!) 142/72 (BP Location: Right Arm, Patient Position: Sitting, Cuff Size: Normal)   Pulse 76   Ht '5\' 11"'  (1.803 m)   Wt 146 lb 9.6 oz (66.5 kg)   SpO2 97%   BMI 20.45 kg/m     Repeat blood pressure by me was 142/72  Wt Readings from Last 3 Encounters:  01/20/21  146 lb 9.6 oz (66.5 kg)  12/12/20 140 lb 9.6 oz (63.8 kg)  11/14/20 141 lb (64 kg)     General: Alert, oriented, no distress.  Skin: normal turgor, no rashes, warm and dry HEENT: Normocephalic, atraumatic. Pupils equal round and reactive to light; sclera anicteric; extraocular muscles intact;  Nose without nasal septal hypertrophy Mouth/Parynx benign; Mallinpatti scale 2 Neck: No JVD, no carotid bruits; normal carotid upstroke Lungs: clear to ausculatation and percussion; no wheezing or rales Chest wall: without tenderness to palpitation Heart: PMI not displaced, RRR, s1 s2 normal, 1/6 systolic murmur, no diastolic murmur, no rubs, gallops, thrills, or heaves Abdomen: soft, nontender; no hepatosplenomehaly, BS+; abdominal aorta nontender and not dilated by palpation. Back: no CVA tenderness Pulses 2+ Musculoskeletal: full range of motion, normal strength, no joint deformities Extremities: resolution of prior lower extremity edema no clubbing cyanosis or edema, Homan's sign negative  Neurologic: grossly nonfocal; Cranial nerves grossly wnl Psychologic: Normal mood and affect   Studies/Labs Reviewed:   EKG:  EKG is ordered today.  ECG (independently read by me): Normal sinus rhythm at 69 bpm.  Evidence for prior anteroseptal MI with QS complex V1 through V3.  Mild T wave abnormalities.  Normal intervals.  No ectopy  Recent Labs: BMP Latest Ref Rng & Units 01/03/2021 10/31/2020 09/11/2020  Glucose 65 - 99 mg/dL 96 78 110(H)  BUN 8 - 27 mg/dL '26 21 24  ' Creatinine 0.76 - 1.27 mg/dL 1.49(H) 1.48(H) 1.45(H)  BUN/Creat Ratio 10 - '24 17 14 17  ' Sodium 134 - 144 mmol/L 141 138 137  Potassium 3.5 - 5.2 mmol/L 4.5 4.1 5.1  Chloride 96 - 106 mmol/L 100 98 99  CO2 20 - 29 mmol/L 27 32 23  Calcium 8.6 - 10.2 mg/dL 9.3 8.9 9.2     Hepatic Function Latest Ref Rng & Units 01/03/2021 09/01/2020 08/19/2020  Total Protein 6.0 - 8.5 g/dL 7.4 6.7 6.4(L)  Albumin 3.7 - 4.7 g/dL 3.9 3.2(L) 3.0(L)  AST 0 -  40 IU/L '18 17 17  ' ALT 0 - 44 IU/L '14 14 20  ' Alk Phosphatase 44 - 121 IU/L 98 61 53  Total Bilirubin 0.0 - 1.2 mg/dL 0.6 1.3(H) 0.8    CBC Latest Ref Rng & Units 01/03/2021 09/11/2020 09/01/2020  WBC 3.4 - 10.8 x10E3/uL 7.1 6.9 14.8(H)  Hemoglobin 13.0 - 17.7 g/dL 13.4 10.4(L) 10.0(L)  Hematocrit 37.5 - 51.0 % 40.5 31.8(L) 32.2(L)  Platelets 150 - 450 x10E3/uL 238  409 412(H)   Lab Results  Component Value Date   MCV 87 01/03/2021   MCV 96 09/11/2020   MCV 104.2 (H) 09/01/2020   Lab Results  Component Value Date   TSH 3.790 01/03/2021   Lab Results  Component Value Date   HGBA1C 6.2 (H) 08/19/2020     BNP    Component Value Date/Time   BNP 1,109.1 (H) 08/19/2020 0358    ProBNP No results found for: PROBNP   Lipid Panel     Component Value Date/Time   CHOL 107 01/03/2021 0808   TRIG 98 01/03/2021 0808   HDL 42 01/03/2021 0808   CHOLHDL 2.5 01/03/2021 0808   CHOLHDL 2.6 08/19/2020 0358   VLDL 16 08/19/2020 0358   LDLCALC 46 01/03/2021 0808   LABVLDL 19 01/03/2021 0808     RADIOLOGY: ECHOCARDIOGRAM COMPLETE  Result Date: 01/02/2021    ECHOCARDIOGRAM REPORT   Patient Name:   TEDDIE MEHTA  Date of Exam: 01/02/2021 Medical Rec #:  [id number]     Height:       71.0 in Accession #:    [id number]    Weight:       140.6 lb Date of Birth:  03-25-1941      BSA:          1.815 m Patient Age:    35 years      BP:           133/73 mmHg Patient Gender: M             HR:           80 bpm. Exam Location:  [place] Procedure: 2D Echo, 3D Echo, Cardiac Doppler, Color Doppler and Intracardiac            Opacification Agent Indications:    I25.2 History of MI  History:        Patient has prior history of Echocardiogram examinations, most                 recent 08/19/2020. Ischemic cardiomyopathy, CAD, CKD,                 Arrythmias:Atrial Fibrillation, Signs/Symptoms:Chest Pain; Risk                 Factors:Hypertension.  Sonographer:    Marygrace Drought RCS Referring Phys: [place]  1. Akinesis of the distal anterior, anteroseptal, inferior and apical walls; overall severe LV dysfunction.  2. Left ventricular ejection fraction, by estimation, is 25 to 30%. The left ventricle has severely decreased function. The left ventricle demonstrates regional wall motion abnormalities (see scoring diagram/findings for description). Left ventricular diastolic parameters are consistent with Grade III diastolic dysfunction (restrictive). Elevated left atrial pressure.  3. Right ventricular systolic function is normal. The right ventricular size is mildly enlarged. There is mildly elevated pulmonary artery systolic pressure.  4. The mitral valve is normal in structure. Mild mitral valve regurgitation. No evidence of mitral stenosis.  5. The aortic valve is tricuspid. Aortic valve regurgitation is trivial. Mild to moderate aortic valve sclerosis/calcification is present, without any evidence of aortic stenosis.  6. The inferior vena cava is normal in size with greater than 50% respiratory variability, suggesting right atrial pressure of 3 mmHg. FINDINGS  Left Ventricle: Left ventricular ejection fraction, by estimation, is 25 to 30%. The left ventricle has severely decreased function. The left ventricle demonstrates regional wall motion abnormalities. Definity contrast agent was given IV to delineate  the left ventricular endocardial borders. The left ventricular internal cavity size was normal in size. There is no left ventricular hypertrophy. Left ventricular diastolic parameters are consistent with Grade III diastolic dysfunction (restrictive). Elevated left atrial pressure. Right Ventricle: The right ventricular size is mildly enlarged.Right ventricular systolic function is normal. There is mildly elevated pulmonary artery systolic pressure. The tricuspid regurgitant velocity is 3.14 m/s, and with an assumed right atrial pressure of 3 mmHg, the estimated right ventricular systolic pressure  is 42.5 mmHg. Left Atrium: Left atrial size was normal in size. Right Atrium: Right atrial size was normal in size. Pericardium: There is no evidence of pericardial effusion. Mitral Valve: The mitral valve is normal in structure. Mild mitral annular calcification. Mild mitral valve regurgitation. No evidence of mitral valve stenosis. Tricuspid Valve: The tricuspid valve is normal in structure. Tricuspid valve regurgitation is mild . No evidence of tricuspid stenosis. Aortic Valve: The aortic valve is tricuspid. Aortic valve regurgitation is trivial. Mild to moderate aortic valve sclerosis/calcification is present, without any evidence of aortic stenosis. Pulmonic Valve: The pulmonic valve was normal in structure. Pulmonic valve regurgitation is trivial. No evidence of pulmonic stenosis. Aorta: The aortic root is normal in size and structure. Venous: The inferior vena cava is normal in size with greater than 50% respiratory variability, suggesting right atrial pressure of 3 mmHg. IAS/Shunts: No atrial level shunt detected by color flow Doppler. Additional Comments: Akinesis of the distal anterior, anteroseptal, inferior and apical walls; overall severe LV dysfunction.  LEFT VENTRICLE PLAX 2D LVIDd:         4.90 cm  Diastology LVIDs:         4.20 cm  LV e' medial:    4.57 cm/s LV PW:         0.90 cm  LV E/e' medial:  25.4 LV IVS:        0.90 cm  LV e' lateral:   6.42 cm/s LVOT diam:     2.00 cm  LV E/e' lateral: 18.1 LV SV:         64 LV SV Index:   35 LVOT Area:     3.14 cm                          3D Volume EF:                         3D EF:        43 %                         LV EDV:       135 ml                         LV ESV:       77 ml                         LV SV:        58 ml RIGHT VENTRICLE RV Basal diam:  4.30 cm RV S prime:     10.40 cm/s TAPSE (M-mode): 1.6 cm RVSP:           42.4 mmHg LEFT ATRIUM             Index       RIGHT ATRIUM  Index LA diam:        3.30 cm 1.82 cm/m  RA Pressure: 3.00  mmHg LA Vol (A2C):   56.4 ml 31.07 ml/m RA Area:     16.10 cm LA Vol (A4C):   42.7 ml 23.52 ml/m RA Volume:   48.10 ml  26.50 ml/m LA Biplane Vol: 49.3 ml 27.16 ml/m  AORTIC VALVE LVOT Vmax:   90.10 cm/s LVOT Vmean:  67.500 cm/s LVOT VTI:    0.205 m  AORTA Ao Root diam: 3.40 cm Ao Asc diam:  3.70 cm MITRAL VALVE                TRICUSPID VALVE MV Area (PHT):              TR Peak grad:   39.4 mmHg MV Decel Time:              TR Vmax:        314.00 cm/s MV E velocity: 116.00 cm/s  Estimated RAP:  3.00 mmHg MV A velocity: 54.40 cm/s   RVSP:           42.4 mmHg MV E/A ratio:  2.13                             SHUNTS                             Systemic VTI:  0.20 m                             Systemic Diam: 2.00 cm Kirk Ruths MD Electronically signed by Kirk Ruths MD Signature Date/Time: 01/02/2021/1:21:48 PM    Final      Additional studies/ records that were reviewed today include:    LHC/CA: 08/18/2020 Hemodynamics: Aortic pressure 91/46, LV pressure 91, LVEDP 61mHg LM: distal 50% stenosis LAD: proximal 75-80% stenosis. Mid LAD subtotal stenosis with faint filling, finally TIMI-2 filling of distal LAD. - Diagonal 1 25-30% stenosis, not a large vessel - Diagonal 2 ostial 8% stenosis LCX: mid vessel with 99% stenosis with ulceration - OM1: proximal 75% stenosis - OM2: small vessel RCA: dominant vessel. Mid RCA with 99% stenosis, jump bridging collaterals which were right -right collaterals. RCA collateralized LAD  Echo 08/19/2020  1. Left ventricular ejection fraction, by estimation, is 25 to 30%. The  left ventricle has severely decreased function.   2. The left ventricle demonstrates regional wall motion abnormalities .  All mid-to-apical septal, mid-to-apical anterolateral, mid-to-apical  anterior, apical inferior and apex appear akinetic. The rest of the LV  walls are hypokinetic.   3. There is mild concentric left ventricular hypertrophy. Left  ventricular diastolic parameters are  consistent with Grade II diastolic  dysfunction (pseudonormalization).   4. Swirling of contrast seen in the left ventricle consistent with low  flow state. No LV thrombus visualized.   5. Right ventricular systolic function is normal. The right ventricular  size is normal.   6. The mitral valve is abnormal. Trivial mitral valve regurgitation. No  evidence of mitral stenosis. Moderate mitral annular calcification.   7. The aortic valve has an indeterminant number of cusps. There is  moderate calcification of the aortic valve. There is moderate thickening  of the aortic valve. Aortic valve regurgitation is not visualized. Mild  aortic valve stenosis. Gradients low due  to  low output state.   8. Wall motion concerning for multivessel coronary artery disease.    ECHO: 01/02/2021 IMPRESSIONS   1. Akinesis of the distal anterior, anteroseptal, inferior and apical  walls; overall severe LV dysfunction.   2. Left ventricular ejection fraction, by estimation, is 25 to 30%. The  left ventricle has severely decreased function. The left ventricle  demonstrates regional wall motion abnormalities (see scoring  diagram/findings for description). Left ventricular  diastolic parameters are consistent with Grade III diastolic dysfunction  (restrictive). Elevated left atrial pressure.   3. Right ventricular systolic function is normal. The right ventricular  size is mildly enlarged. There is mildly elevated pulmonary artery  systolic pressure.   4. The mitral valve is normal in structure. Mild mitral valve  regurgitation. No evidence of mitral stenosis.   5. The aortic valve is tricuspid. Aortic valve regurgitation is trivial.  Mild to moderate aortic valve sclerosis/calcification is present, without  any evidence of aortic stenosis.   6. The inferior vena cava is normal in size with greater than 50%  respiratory variability, suggesting right atrial pressure of 3 mmHg.   ASSESSMENT:    1.  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 Ischemic cardiomyopathy   3. Grade III diastolic dysfunction   4. Medication management   5. History of MI (myocardial infarction)   6. Stage 3a chronic kidney disease ([place])   7. Pleural effusion, left: improved   8. Ankle edema: improved   9. Hyperlipidemia with target LDL less than 70   10. Paroxysmal atrial fibrillation [facility]): post op 08/2020    PLAN:  Mr. Paul [name] is a very pleasant 45 -year-old gentleman who had developed progressive exertional shortness of breath leading to hypoxia requiring prompt hospital admission to [facility] in [place] in March 2022.  Initial BNP was 1255.  CTA of his chest was negative for PE but showed pulmonary edema with bilateral effusions.  Cardiac catheterization on August 18, 2020  showed severe multivessel CAD and ultimately underwent successful CABG revascularization surgery by Dr. [name] on August 23, 2020.  His postop course was complicated by atrial fibrillation with RVR for which he required amiodarone.  He also developed bilateral effusions and ultimately required subsequent thoracentesis in May 2022.  When I saw him for his initial evaluation with me on December 12, 2020 he was feeling significantly improved.  He did not have any recurrent anginal symptomatology.  He was breathing well.  He continued to have some  lower extremity edema which had been treated with furosemide.  Following his office visit, a follow-up chest x-ray showed significant improvement in his previous pleural effusion.  I recommended he undergo a follow-up echo Doppler study.  This was done on January 02, 2021 and shows significant LV dysfunction with EF at 25 to 30%.  He had LVH and grade 3 diastolic dysfunction consistent with restrictive physiology.  Presently, he is breathing better.  Since his edema is significantly improved I have recommended he reduce his Lasix from 20 mg twice  a day down to 20 mg daily.  I believe he will do well with initiation of Entresto and I provided him with samples of 24/26 mg twice a day.  I have recommended he discontinue his potassium supplementation.  With his resting pulse of 76 I am adding metoprolol succinate initially at 12.5 mg.  I also have recommended he reduce his aspirin down to 81 mg daily.  He continues to be on atorvastatin 40 mg for hyperlipidemia with target LDL less than 70.  Lipid studies on January 03, 2021 showed LDL cholesterol now at 46.  His creatinine was 1.49.  I have recommended that he see Joslyn Hy, or Pharm.D. in 3 to 4 weeks after initiation of Entresto.  Prior to that visit he we will recheck a comprehensive metabolic panel as well as a proBNP.  If renal function remains stable and blood pressure remains adequate further titration of Entresto to 49/51 mg may be able to be done at that office visit.  Renal function also will determine potential aldosterone inhibition.  On exam he does have murmur consistent with mitral regurgitation and on his most recent echo there was mitral annular calcification with mild MR.  He also had mild to moderate aortic valve sclerosis.  I will see him in 2 months for follow-up evaluation.    Presently he feels improved.  He denies any chest tightness.  His shortness of breath has improved.  He continues to experience ankle swelling and has been on furosemide 40 mg twice a day.  He has continued to be on atorvastatin at just 40 mg daily and has been taking supplemental potassium 10 mill equivalents with his furosemide.  He is unaware of any recurrent atrial fibrillation.  His ECG shows sinus rhythm at 69 bpm with QS complex consistent with anteroseptal myocardial infarction.  With his mild leg edema at presently I have recommended he continue furosemide 20 mg twice a day.  He will be undergoing a follow-up PA and lateral chest x-ray today to reassess his pleural effusion which hopefully has not  recurred.  On exam his lungs are clear and the was not any blunting of his costophrenic angles.  I have recommended he initiate support stockings which would be beneficial.  I am recommending he have a follow-up echo Doppler study to reassess LV systolic and diastolic function as well as pulmonary pressures following his successful coronary revascularization.  I will contact him regarding his chest x-ray result.  Laboratory from March 30 was reviewed which showed him to be anemic with a hemoglobin of 10.4 hematocrit 31.8.  There was stage III CKD with creatinine 1.45. I am also recommending follow-up fasting laboratory with a comprehensive metabolic panel, CBC, TSH and lipid studies.  He is currently participating in cardiac rehab.    I will see him in 6 to 8 weeks for follow-up evaluation or sooner as needed   Medication Adjustments/Labs and Tests Ordered: Current medicines are reviewed at length with the patient today.  Concerns regarding medicines are outlined above.  Medication changes, Labs and Tests ordered today are listed in the Patient Instructions below. Patient Instructions  Medication Instructions:   Start taking  Entresto  24/26 mg  one tablet twice a day   Stop taking Potassium ( k-dur) Stop taking aspirin 325 mg Stop taking Lasix ( furosemide) 20 mg twice a day   Start taking Lasix  20 mg  one tablet daily  Start taking Aspirin 81 mg  one tablet daily - purchase over the counter.  Start taking Metoprolol succinate 12.5 mg ( 1/2 tablet of 25 mg) daily    *If you need a refill on your cardiac medications before your next appointment, please call your pharmacy*   Lab Work:  CMP Pro- BNP- in 2 -3 weeks   If you have labs (blood work) drawn today and your tests are completely normal, you will receive your  results only by: MyChart Message (if you have MyChart) OR A paper copy in the mail If you have any lab test that is abnormal or we need to change your treatment, we will call  you to review the results.   Testing/Procedures: Not needed   Follow-Up: At [facility], you and your health needs are our priority.  As part of our continuing mission to provide you with exceptional heart care, we have created designated Provider Care Teams.  These Care Teams include your primary Cardiologist (physician) and Advanced Practice Providers (APPs -  Physician Assistants and Nurse Practitioners) who all work together to provide you with the care you need, when you need it.  We recommend signing up for the patient portal called "MyChart".  Sign up information is provided on this After Visit Summary.  MyChart is used to connect with patients for Virtual Visits (Telemedicine).  Patients are able to view lab/test results, encounter notes, upcoming appointments, etc.  Non-urgent messages can be sent to your provider as well.   To learn more about what you can do with MyChart, go to [email or web address].    Your next appointment:   2 month(s)  The format for your next appointment:   In Person  Provider:   [name], MD  Your physician recommends that you schedule a follow-up appointment in 3-4 weeks with [facility]- FOR TITRATION OF Luella Cook [name], MD  01/27/2021 9:41 AM    [facility] [address] [place], [place], [place]  [address] Phone: [phone]

## 2021-01-29 LAB — COMPREHENSIVE METABOLIC PANEL
ALT: 12 IU/L (ref 0–44)
AST: 15 IU/L (ref 0–40)
Albumin/Globulin Ratio: 1.4 (ref 1.2–2.2)
Albumin: 4 g/dL (ref 3.7–4.7)
Alkaline Phosphatase: 87 IU/L (ref 44–121)
BUN/Creatinine Ratio: 13 (ref 10–24)
BUN: 15 mg/dL (ref 8–27)
Bilirubin Total: 0.8 mg/dL (ref 0.0–1.2)
CO2: 31 mmol/L — ABNORMAL HIGH (ref 20–29)
Calcium: 9.2 mg/dL (ref 8.6–10.2)
Chloride: 99 mmol/L (ref 96–106)
Creatinine, Ser: 1.2 mg/dL (ref 0.76–1.27)
Globulin, Total: 2.9 g/dL (ref 1.5–4.5)
Glucose: 101 mg/dL — ABNORMAL HIGH (ref 65–99)
Potassium: 4.1 mmol/L (ref 3.5–5.2)
Sodium: 140 mmol/L (ref 134–144)
Total Protein: 6.9 g/dL (ref 6.0–8.5)
eGFR: 61 mL/min/{1.73_m2} (ref >59)

## 2021-01-29 LAB — PRO B NATRIURETIC PEPTIDE: NT-Pro BNP: 1566 pg/mL — ABNORMAL HIGH (ref 0–486)

## 2021-02-03 ENCOUNTER — Other Ambulatory Visit: Payer: Self-pay

## 2021-02-03 ENCOUNTER — Ambulatory Visit (INDEPENDENT_AMBULATORY_CARE_PROVIDER_SITE_OTHER): Payer: Medicare Other | Admitting: Pharmacist Clinician (PhC)/ Clinical Pharmacy Specialist

## 2021-02-03 DIAGNOSIS — I5020 Unspecified systolic (congestive) heart failure: Secondary | ICD-10-CM | POA: Diagnosis not present

## 2021-02-03 DIAGNOSIS — I255 Ischemic cardiomyopathy: Secondary | ICD-10-CM | POA: Diagnosis not present

## 2021-02-03 NOTE — Assessment & Plan Note (Signed)
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  Currently feeling well and without complaint.  Reviewed information on GDMT with patient and daughter.  Cannot increase Entresto or metoprolol due to a fair number of home BP readings in the high 90's systolic.  Cannot use spironolactone for same reasons.  Discussed option of SGLT2 inhibitors, Marcelline Deist is drug of choice on his plan.  Unfortunately he has a $480 brand deductible, then has to pay 17% on both Entresto and Farxiga.  This puts his cost at > $100 per month on each.  He was given 30 day free card and samples of both, as well as paperwork to apply for free medication from the manufacturers.  He will return that and we can see if he can get help with costs.

## 2021-02-03 NOTE — Patient Instructions (Signed)
Return for a a follow up appointment with Dr. Tresa Endo on October 4  Check your blood pressure at home daily (if able) and keep record of the readings.  Take your BP meds as follows:  Start Farxiga 10 mg once daily (morning or evening)  Continue with all other medications.  Complete the applications for [place].  Return these to [place] and we will submit them to the manufacturers.    Bring all of your meds, your BP cuff and your record of home blood pressures to your next appointment.  Exercise as you're able, try to walk approximately 30 minutes per day.  Keep salt intake to a minimum, especially watch canned and prepared boxed foods.  Eat more fresh fruits and vegetables and fewer canned items.  Avoid eating in fast food restaurants.    HOW TO TAKE YOUR BLOOD PRESSURE: Rest 5 minutes before taking your blood pressure.  Don't smoke or drink caffeinated beverages for at least 30 minutes before. Take your blood pressure before (not after) you eat. Sit comfortably with your back supported and both feet on the floor (don't cross your legs). Elevate your arm to heart level on a table or a desk. Use the proper sized cuff. It should fit smoothly and snugly around your bare upper arm. There should be enough room to slip a fingertip under the cuff. The bottom edge of the cuff should be 1 inch above the crease of the elbow. Ideally, take 3 measurements at one sitting and record the average.

## 2021-02-03 NOTE — Progress Notes (Signed)
     02/03/2021 [name] 08-21-1940 [id number]   HPI:  Paul [name] is a 80 y.o. male patient of Dr Tresa Endo, with a PMH below who presents today for heart failure medication titration.  Patient was admitted to [facility] (via hospital in [place]) in March and underwent CABG x 3 and noted to have EF at 30-35%.  At a follow up echo in July EF was down to 25-30% with grade 3 diastolic dysfunction.  He has been feeling well since recovering, and attends cardiac rehab in [place] regularly.  When Dr. Tresa Endo saw him earlier this month, he was started on Entresto 24/26 mg bid.  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  CKD SCr improved from 1.49 to 1.20; GFR now at 61              Blood Pressure Goal:  130/80  Current Medications: entresto 24/26 mg bid, furosemide 20 mg qd, metoprolol succ 12.5 mg qd  Family Hx:  Social Hx:   Diet:   Exercise:   Home BP readings:   Intolerances:   Labs: 8/22:  Na 140, K 4.1, Glu 101, BUN 15, SCr 1.20 GFR 61   NT-Pro BNP 1566   Wt Readings from Last 3 Encounters:  01/20/21 146 lb 9.6 oz (66.5 kg)  12/12/20 140 lb 9.6 oz (63.8 kg)  11/14/20 141 lb (64 kg)   BP Readings from Last 3 Encounters:  01/20/21 (!) 142/72  01/02/21 133/73  12/12/20 122/72   Pulse Readings from Last 3 Encounters:  01/20/21 76  12/12/20 69  11/14/20 82    Current Outpatient Medications  Medication Sig Dispense Refill   aspirin EC 81 MG tablet Take 1 tablet (81 mg total) by mouth daily. Swallow whole. 90 tablet 3   atorvastatin (LIPITOR) 40 MG tablet Take 1 tablet (40 mg total) by mouth daily. 90 tablet 3   finasteride (PROSCAR) 5 MG tablet Take 1 tablet (5 mg total) by mouth daily. 30 tablet 1   furosemide (LASIX) 20 MG tablet Take 1 tablet (20 mg total) by mouth daily. 90 tablet 3   latanoprost (XALATAN) 0.005 % ophthalmic solution Place 1 drop into both eyes at bedtime.     metoprolol succinate (TOPROL XL) 25 MG 24 hr tablet Take 0.5 tablets  (12.5 mg total) by mouth daily. 45 tablet 3   Multiple Vitamin (MULTIVITAMIN WITH MINERALS) TABS tablet Take 1 tablet by mouth daily.     sacubitril-valsartan (ENTRESTO) 24-26 MG Take 1 tablet by mouth 2 (two) times daily. 60 tablet 4   No current facility-administered medications for this visit.    No Known Allergies  Past Medical History:  Diagnosis Date   Glaucoma    High blood pressure    High cholesterol     There were no vitals taken for this visit.  No problem-specific Assessment & Plan notes found for this encounter.   Phillips Hay PharmD CPP [facility] [address] [place], [place] [address] [phone]

## 2021-02-03 NOTE — Progress Notes (Signed)
02/03/2021 Zaevion Parke 18-Sep-1940 [id number]   HPI:  Freman Lapage is a 80 y.o. male patient of Dr Tresa Endo, with a PMH below who presents today for heart failure medication titration.  Patient was admitted to [facility] (via hospital in [place]) in March and underwent CABG x 3 and noted to have EF at 30-35%.  At a follow up echo in July EF was down to 25-30% with grade 3 diastolic dysfunction.  He has been feeling well since recovering, and attends cardiac rehab in [place] regularly.  When Dr. Tresa Endo saw him earlier this month, he was started on Entresto 24/26 mg bid.  Today he returns for follow up and medication titration.  He is accompanied by his daughter today.  States he has tolerated both the Entresto and metoprolol without any issues.  No complaints of chest pain, shortness of breath or chest pressure.  Continues with furosemide 20 mg daily as well as wearing compression stockings.    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  CKD SCr improved from 1.49 to 1.20; GFR now at 61     Blood Pressure Goal:  130/80  Current Medications: entresto 24/26 mg bid, furosemide 20 mg qd, metoprolol succ 12.5 mg qd  Family Hx: father died from melanoma in his 27's, mother also in 38's after fall/hip break; brother with AF, valve issues; 1 daughter w/o health issues  Social Hx: no tobacco, no alcohol, coffee in the mornings, some diet soda  Diet: using air fryer, cooking some at home for himself, fewer frozen meals  Exercise: walks at mall when not at cardiac rehab (last day for that is tomorrow)  Home BP readings: checks daily at home, systolic range 98-120, diastolic 55-65  Intolerances: nkda  Labs: 8/22:  Na 140, K 4.1, Glu 101, BUN 15, SCr 1.20 GFR 61   NT-Pro BNP 1566   Wt Readings from Last 3 Encounters:  02/03/21 149 lb 3.2 oz (67.7 kg)  01/20/21 146 lb 9.6 oz (66.5 kg)  12/12/20 140 lb 9.6 oz (63.8 kg)   BP Readings from Last 3 Encounters:  02/03/21 (!) 118/54   01/20/21 (!) 142/72  01/02/21 133/73   Pulse Readings from Last 3 Encounters:  02/03/21 73  01/20/21 76  12/12/20 69    Current Outpatient Medications  Medication Sig Dispense Refill   aspirin EC 81 MG tablet Take 1 tablet (81 mg total) by mouth daily. Swallow whole. 90 tablet 3   atorvastatin (LIPITOR) 40 MG tablet Take 1 tablet (40 mg total) by mouth daily. 90 tablet 3   finasteride (PROSCAR) 5 MG tablet Take 1 tablet (5 mg total) by mouth daily. 30 tablet 1   furosemide (LASIX) 20 MG tablet Take 1 tablet (20 mg total) by mouth daily. 90 tablet 3   latanoprost (XALATAN) 0.005 % ophthalmic solution Place 1 drop into both eyes at bedtime.     metoprolol succinate (TOPROL XL) 25 MG 24 hr tablet Take 0.5 tablets (12.5 mg total) by mouth daily. 45 tablet 3   Multiple Vitamin (MULTIVITAMIN WITH MINERALS) TABS tablet Take 1 tablet by mouth daily.     sacubitril-valsartan (ENTRESTO) 24-26 MG Take 1 tablet by mouth 2 (two) times daily. 60 tablet 4   No current facility-administered medications for this visit.    No Known Allergies  Past Medical History:  Diagnosis Date   Glaucoma    High blood pressure    High cholesterol     Blood pressure Marland Kitchen)  118/54, pulse 73, resp. rate 16, height 5\' 11"  (1.803 m), weight 149 lb 3.2 oz (67.7 kg), SpO2 97 %.  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  Currently feeling well and without complaint.  Reviewed information on GDMT with patient and daughter.  Cannot increase Entresto or metoprolol due to a fair number of home BP readings in the high 90's systolic.  Cannot use spironolactone for same reasons.  Discussed option of SGLT2 inhibitors, is drug of choice on his plan.  Unfortunately he has a $480 brand deductible, then has to pay 17% on both Entresto and Farxiga.  This puts his cost at > $100 per month on each.  He was given 30 day free card and samples of both, as well as paperwork to  apply for free medication from the manufacturers.  He will return that and we can see if he can get help with costs.    06-19-2002 PharmD CPP [facility] [address] [place], [place] [phone]

## 2021-02-05 ENCOUNTER — Telehealth: Payer: Self-pay

## 2021-02-05 ENCOUNTER — Other Ambulatory Visit (HOSPITAL_BASED_OUTPATIENT_CLINIC_OR_DEPARTMENT_OTHER): Payer: Self-pay | Admitting: Pharmacist Clinician (PhC)/ Clinical Pharmacy Specialist

## 2021-02-05 MED ORDER — DAPAGLIFLOZIN PROPANEDIOL 10 MG PO TABS: 10.0000 mg | ORAL_TABLET | Freq: Every day | ORAL | 3 refills | Status: DC

## 2021-02-05 NOTE — Telephone Encounter (Signed)
Pt's daughter called requesting to speak w/kristin alvstad so I will route to her

## 2021-02-05 NOTE — Telephone Encounter (Signed)
Spoke with daughter.  Rx for [employer] sent to local [facility] in [place].    Received patient assistance paperwork, will have MD sign and submit.

## 2021-02-13 ENCOUNTER — Telehealth: Payer: Self-pay

## 2021-02-13 NOTE — Telephone Encounter (Signed)
Noted! Thank you

## 2021-02-13 NOTE — Telephone Encounter (Signed)
[employer] called and stated that they received their forms along with [place] and [place] forms that were only filled out with farxiga information. The forms in question either need to be corrected or he needs to complete new forms. For both [employer] for the entresto and az and me for the farxiga. Will route to [employer] lpn for dr Tresa Endo.

## 2021-02-13 NOTE — Telephone Encounter (Signed)
Called and spoke to the pt's daughter and indicated that we need to fill out new forms she was agreeable and I will mail them out as requested

## 2021-02-20 NOTE — Telephone Encounter (Signed)
Pt daughter Vernona Rieger reaching out to see if forms have been sent.the patient has still not received them... please advise

## 2021-03-08 ENCOUNTER — Other Ambulatory Visit: Payer: Self-pay | Admitting: Cardiovascular Disease

## 2021-03-08 DIAGNOSIS — I10 Essential (primary) hypertension: Secondary | ICD-10-CM

## 2021-03-18 ENCOUNTER — Other Ambulatory Visit: Payer: Self-pay

## 2021-03-18 ENCOUNTER — Ambulatory Visit (INDEPENDENT_AMBULATORY_CARE_PROVIDER_SITE_OTHER): Payer: Medicare Other | Admitting: Cardiovascular Disease

## 2021-03-18 ENCOUNTER — Encounter: Payer: Self-pay | Admitting: Cardiovascular Disease

## 2021-03-18 VITALS — BP 143/72 | HR 63 | Ht 71.0 in | Wt 152.0 lb

## 2021-03-18 DIAGNOSIS — M25473 Effusion, unspecified ankle: Secondary | ICD-10-CM

## 2021-03-18 DIAGNOSIS — I2510 Atherosclerotic heart disease of native coronary artery without angina pectoris: Secondary | ICD-10-CM

## 2021-03-18 DIAGNOSIS — Z951 Presence of aortocoronary bypass graft: Secondary | ICD-10-CM

## 2021-03-18 DIAGNOSIS — I5189 Other ill-defined heart diseases: Secondary | ICD-10-CM

## 2021-03-18 DIAGNOSIS — I255 Ischemic cardiomyopathy: Secondary | ICD-10-CM

## 2021-03-18 DIAGNOSIS — E785 Hyperlipidemia, unspecified: Secondary | ICD-10-CM

## 2021-03-18 DIAGNOSIS — I5020 Unspecified systolic (congestive) heart failure: Secondary | ICD-10-CM

## 2021-03-18 MED ORDER — SPIRONOLACTONE 25 MG PO TABS: 12.5000 mg | ORAL_TABLET | Freq: Every day | ORAL | 2 refills | Status: DC

## 2021-03-18 NOTE — Patient Instructions (Signed)
Medication Instructions:  Stop Lasix  Start Spironolactone 12.5 mg daily   *If you need a refill on your cardiac medications before your next appointment, please call your pharmacy*   Lab Work: BMET, ProBNP (in 6 weeks, no lab appointment needed)  If you have labs (blood work) drawn today and your tests are completely normal, you will receive your results only by: MyChart Message (if you have MyChart) OR A paper copy in the mail If you have any lab test that is abnormal or we need to change your treatment, we will call you to review the results.   Follow-Up: At [facility], you and your health needs are our priority.  As part of our continuing mission to provide you with exceptional heart care, we have created designated Provider Care Teams.  These Care Teams include your primary Cardiologist (physician) and Advanced Practice Providers (APPs -  Physician Assistants and Nurse Practitioners) who all work together to provide you with the care you need, when you need it.  We recommend signing up for the patient portal called "MyChart".  Sign up information is provided on this After Visit Summary.  MyChart is used to connect with patients for Virtual Visits (Telemedicine).  Patients are able to view lab/test results, encounter notes, upcoming appointments, etc.  Non-urgent messages can be sent to your provider as well.   To learn more about what you can do with MyChart, go to [email or web address].    Your next appointment:   November 17th at 10:40 AM

## 2021-03-18 NOTE — Progress Notes (Signed)
Cardiology Office Note    Date:  03/25/2021   ID:  Paul [name], DOB May 21, 1941, MRN [id number]  PCP:  [name], MD  Cardiologist:  [name], MD   2 month follow-up office visit   History of Present Illness:  Paul [name] is a 80 y.o. male who is from [place].  I saw him when he was admitted to [facility] on August 18, 2020.  At that time he had a 6-monthhistory of episodic exertionally precipitated chest discomfort.  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  [facility]  I recommended he undergo a cardiac MRI to assess for LV viability.  A 2D echo Doppler study showed reduced LV function with EF at 30 to 35%.  He was found to have grade 3 diastolic dysfunction and significant increased pulmonary artery systolic pressure at 70 mm with moderate tricuspid regurgitation.  He ultimately underwent CABG revascularization surgery x3 by Dr. BGilford Raidand had a LIMA graft placed to the OM1, SVG to OM 2, and SVG to the posterolateral vessel on August 23, 2021.  He returned to the OR the following day for expectation of being studied for bleeding.  Postoperative course was complicated by volume overload and PAF with RVR for which she was placed on amiodarone therapy.  He also required midodrine for hypotension.  He was subsequently seen for his initial hospital visit by [name] PA on September 11, 2020.  The patient has been seen by Dr. BCyndia Bentand had  developed a left pleural effusion and required left thoracentesis on Nov 05, 2020.  575 cc of dark red fluid was removed.  Follow-up chest x-ray did not reveal any pneumothorax.  There was a small residual effusion which remained.  It was recommended he continue Lasix 40 mg daily.  I saw him for my initial evaluation with me follow his hospitalization on December 12, 2020.  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  He denied any recurrence of chest pain or shortness of breath and [place]  At times he had experienced lower extremity edema around his ankles.  He was unaware of palpitations.  His breathing has improved.  His ECG showed sinus rhythm with QS complex consistent with his anteroseptal myocardial infarction.  With his leg edema I suggested he continue furosemide 20 mg twice a day.  I recommended he undergo a follow-up echo Doppler study to reassess LV systolic and diastolic function as well as pulmonary pressures.  He had stage III CKD with creatinine 1.45 and follow-up laboratory was recommended.  He underwent a follow-up chest x-ray following his office visit which showed improvement in his left pleural effusion compared to his prior November 15, 2019 evaluation.  On January 02, 2021 he underwent a follow-up  echo Doppler study.  EF continues to be severely reduced at 25 to 30% with grade 3 diastolic dysfunction consistent with restrictive physiology.  He had elevated left atrial pressure.  There was mild mitral regurgitation and mild to moderate aortic sclerosis without stenosis.  Estimated RV systolic pressure was 12.1 mm.  He continues to participate in cardiac rehab.  He denies any chest pain.    I saw him for an office evaluation on January 20, 2021.  At that time, he was breathing better and since his edema was significantly improved I recommended he reduce Lasix from 20 mg twice a day to 20 mg daily.  At that time I initiated Entresto at 24/26 mg twice a day and provided him with samples.  Recommended he discontinue his potassium supplementation.  With his resting pulse of 76 I added low-dose metoprolol succinate at 12.5 mg.  I recommended he reduce his aspirin to 81 mg.  He continues to be on atorvastatin 40 mg for hyperlipidemia.  I recommended that he follow-up with our pharmacist in 3 to 4 weeks after initiation of Entresto.  As result, he was subsequently evaluated on February 03, 2021.  Evaluation due to low blood pressure readings his Delene Loll was not increased.  He was started on SGLT2 inhibition was started on Farxiga 10 mg.  Is only, he feels well.  He participated in cardiac rehab and derive significant benefit.  He states his blood pressures at home typically run around 975-8 08 systolically.  He denies recurrent chest pain or edema or shortness of breath.  He presents for evaluation.  Past Medical History:  Diagnosis Date   Glaucoma    High blood pressure    High cholesterol     Past Surgical History:  Procedure Laterality Date   CARDIAC CATHETERIZATION  08/19/2020   CHEST EXPLORATION N/A 08/23/2020   Procedure: RE- EXPLORATION POST CABG;  Surgeon: Gaye Pollack, MD;  Location: [facility] OR;  Service: Thoracic;  Laterality: N/A;   CORONARY ARTERY BYPASS GRAFT N/A 08/23/2020   Procedure: CORONARY ARTERY BYPASS GRAFTING (CABG) X   THREE , LEFT INTERNAL MAMMARY HARVEST, RIGHT LEG ENDOSCOPIC SAPHENOUS VEIN HARVESTING, LEFT ENDOSCOPIC SAPHENOUS VEIN HARVEST;  Surgeon: Gaye Pollack, MD;  Location: [facility];  Service: Open Heart Surgery;  Laterality: N/A;   IR THORACENTESIS ASP PLEURAL SPACE W/IMG GUIDE  11/05/2020   TEE WITHOUT CARDIOVERSION N/A 08/23/2020   Procedure: TRANSESOPHAGEAL ECHOCARDIOGRAM (TEE);  Surgeon: Gaye Pollack, MD;   Location: [facility];  Service: Open Heart Surgery;  Laterality: N/A;   WRIST FRACTURE SURGERY Right    "years ago"    Current Medications: Outpatient Medications Prior to Visit  Medication Sig Dispense Refill   aspirin EC 81 MG tablet Take 1 tablet (81 mg total) by mouth daily. Swallow whole. 90 tablet 3   atorvastatin (LIPITOR) 40 MG tablet Take 1 tablet (40 mg total) by mouth daily. 90 tablet 3   dapagliflozin propanediol (FARXIGA) 10 MG TABS tablet Take 1 tablet (10 mg total) by mouth daily before breakfast. 30 tablet 3   finasteride (PROSCAR) 5 MG tablet Take 1 tablet (5 mg total) by mouth daily. 30 tablet 1   latanoprost (XALATAN) 0.005 % ophthalmic solution Place 1 drop into both eyes at bedtime.     metoprolol succinate (TOPROL XL) 25 MG 24 hr tablet Take 0.5 tablets (12.5 mg total) by mouth daily. 45 tablet 3   Multiple Vitamin (MULTIVITAMIN WITH MINERALS) TABS tablet Take 1 tablet by  mouth daily.     sacubitril-valsartan (ENTRESTO) 24-26 MG Take 1 tablet by mouth 2 (two) times daily. 60 tablet 4   furosemide (LASIX) 20 MG tablet Take 1 tablet (20 mg total) by mouth daily. 90 tablet 3   potassium chloride (KLOR-CON) 10 MEQ tablet TAKE 1 TABLET BY MOUTH 2 TIMES DAILY. (Patient not taking: Reported on 03/18/2021) 60 tablet 2   No facility-administered medications prior to visit.     Allergies:   Patient has no known allergies.   Social History   Socioeconomic History   Marital status: Widowed    Spouse name: Not on file   Number of children: Not on file   Years of education: Not on file   Highest education level: Not on file  Occupational History   Not on file  Tobacco Use   Smoking status: Never   Smokeless tobacco: Never  Substance and Sexual Activity   Alcohol use: Not Currently   Drug use: Never   Sexual activity: Not on file  Other Topics Concern   Not on file  Social History Narrative   Not on file   Social Determinants of Health   Financial Resource Strain:  Not on file  Food Insecurity: Not on file  Transportation Needs: Not on file  Physical Activity: Not on file  Stress: Not on file  Social Connections: Not on file     Family History:  The patient's family history is not on file.  Parents are deceased.  ROS General: Negative; No fevers, chills, or night sweats;  HEENT: Negative; No changes in vision or hearing, sinus congestion, difficulty swallowing Pulmonary: Negative; No cough, wheezing, shortness of breath, hemoptysis Cardiovascular: See HPI GI: Negative; No nausea, vomiting, diarrhea, or abdominal pain GU: Negative; No dysuria, hematuria, or difficulty voiding Musculoskeletal: Negative; no myalgias, joint pain, or weakness Hematologic/Oncology: Negative; no easy bruising, bleeding Endocrine: Negative; no heat/cold intolerance; no diabetes Neuro: Negative; no changes in balance, headaches Skin: Negative; No rashes or skin lesions Psychiatric: Negative; No behavioral problems, depression Sleep: Negative; No snoring, daytime sleepiness, hypersomnolence, bruxism, restless legs, hypnogognic hallucinations, no cataplexy Other comprehensive 14 point system review is negative.   PHYSICAL EXAM:   VS:  BP (!) 143/72   Pulse 63   Ht 5' 11" (1.803 m)   Wt 152 lb (68.9 kg)   SpO2 100%   BMI 21.20 kg/m     Repeat blood pressure by me was 130/70  He states recent blood pressures have been around 932 systolically.  Wt Readings from Last 3 Encounters:  03/18/21 152 lb (68.9 kg)  02/03/21 149 lb 3.2 oz (67.7 kg)  01/20/21 146 lb 9.6 oz (66.5 kg)    General: Alert, oriented, no distress.  Skin: normal turgor, no rashes, warm and dry HEENT: Normocephalic, atraumatic. Pupils equal round and reactive to light; sclera anicteric; extraocular muscles intact;  Nose without nasal septal hypertrophy Mouth/Parynx benign; Mallinpatti scale 2 Neck: No JVD, no carotid bruits; normal carotid upstroke Lungs: clear to ausculatation and  percussion; no wheezing or rales Chest wall: without tenderness to palpitation Heart: PMI not displaced, RRR, s1 s2 normal, 1/6 systolic murmur, no diastolic murmur, no rubs, gallops, thrills, or heaves Abdomen: soft, nontender; no hepatosplenomehaly, BS+; abdominal aorta nontender and not dilated by palpation. Back: no CVA tenderness Pulses 2+ Musculoskeletal: full range of motion, normal strength, no joint deformities Extremities: Resolution of prior lower extremity edema no clubbing cyanosis or edema, Homan's sign negative  Neurologic: grossly nonfocal; Cranial nerves  grossly wnl Psychologic: Normal mood and affect    Studies/Labs Reviewed:   EKG:  EKG is ordered today.  ECG (independently read by me):  NSR at 63, QS V1-3, T inversion V1-5, QTc 446 msec  January 20, 2021 ECG (independently read by me): Normal sinus rhythm at 69 bpm.  Evidence for prior anteroseptal MI with QS complex V1 through V3.  Mild T wave abnormalities.  Normal intervals.  No ectopy  Recent Labs: BMP Latest Ref Rng & Units 01/28/2021 01/03/2021 10/31/2020  Glucose 65 - 99 mg/dL 101(H) 96 78  BUN 8 - 27 mg/dL _0 Creatinine 0.76 - 1.27 mg/dL 1.20 1.49(H) 1.48(H)  BUN/Creat Ratio 10 - _1 Sodium 134 - 144 mmol/L 140 141 138  Potassium 3.5 - 5.2 mmol/L 4.1 4.5 4.1  Chloride 96 - 106 mmol/L 99 100 98  CO2 20 - 29 mmol/L 31(H) 27 32  Calcium 8.6 - 10.2 mg/dL 9.2 9.3 8.9     Hepatic Function Latest Ref Rng & Units 01/28/2021 01/03/2021 09/01/2020  Total Protein 6.0 - 8.5 g/dL 6.9 7.4 6.7  Albumin 3.7 - 4.7 g/dL 4.0 3.9 3.2(L)  AST 0 - 40 IU/L _2 ALT 0 - 44 IU/L _3 Alk Phosphatase 44 - 121 IU/L 87 98 61  Total Bilirubin 0.0 - 1.2 mg/dL 0.8 0.6 1.3(H)    CBC Latest Ref Rng & Units 01/03/2021 09/11/2020 09/01/2020  WBC 3.4 - 10.8 x10E3/uL 7.1 6.9 14.8(H)  Hemoglobin 13.0 - 17.7 g/dL 13.4 10.4(L) 10.0(L)  Hematocrit 37.5 - 51.0 % 40.5 31.8(L) 32.2(L)  Platelets 150 - 450 x10E3/uL 238 409  412(H)   Lab Results  Component Value Date   MCV 87 01/03/2021   MCV 96 09/11/2020   MCV 104.2 (H) 09/01/2020   Lab Results  Component Value Date   TSH 3.790 01/03/2021   Lab Results  Component Value Date   HGBA1C 6.2 (H) 08/19/2020     BNP    Component Value Date/Time   BNP 1,109.1 (H) 08/19/2020 0358    ProBNP    Component Value Date/Time   PROBNP 1,566 (H) 01/28/2021 1032     Lipid Panel     Component Value Date/Time   CHOL 107 01/03/2021 0808   TRIG 98 01/03/2021 0808   HDL 42 01/03/2021 0808   CHOLHDL 2.5 01/03/2021 0808   CHOLHDL 2.6 08/19/2020 0358   VLDL 16 08/19/2020 0358   LDLCALC 46 01/03/2021 0808   LABVLDL 19 01/03/2021 0808     RADIOLOGY: No results found.   Additional studies/ records that were reviewed today include:    LHC/CA: 08/18/2020 Hemodynamics: Aortic pressure 91/46, LV pressure 91, LVEDP 63mHg LM: distal 50% stenosis LAD: proximal 75-80% stenosis. Mid LAD subtotal stenosis with faint filling, finally TIMI-2 filling of distal LAD. - Diagonal 1 25-30% stenosis, not a large vessel - Diagonal 2 ostial 8% stenosis LCX: mid vessel with 99% stenosis with ulceration - OM1: proximal 75% stenosis - OM2: small vessel RCA: dominant vessel. Mid RCA with 99% stenosis, jump bridging collaterals which were right -right collaterals. RCA collateralized LAD  Echo 08/19/2020  1. Left ventricular ejection fraction, by estimation, is 25 to 30%. The  left ventricle has severely decreased function.   2. The left ventricle demonstrates regional wall motion abnormalities .  All mid-to-apical septal, mid-to-apical anterolateral, mid-to-apical  anterior, apical inferior and apex appear akinetic. The rest of the LV  walls are hypokinetic.  3. There is mild concentric left ventricular hypertrophy. Left  ventricular diastolic parameters are consistent with Grade II diastolic  dysfunction (pseudonormalization).   4. Swirling of contrast seen in the left  ventricle consistent with low  flow state. No LV thrombus visualized.   5. Right ventricular systolic function is normal. The right ventricular  size is normal.   6. The mitral valve is abnormal. Trivial mitral valve regurgitation. No  evidence of mitral stenosis. Moderate mitral annular calcification.   7. The aortic valve has an indeterminant number of cusps. There is  moderate calcification of the aortic valve. There is moderate thickening  of the aortic valve. Aortic valve regurgitation is not visualized. Mild  aortic valve stenosis. Gradients low due  to low output state.   8. Wall motion concerning for multivessel coronary artery disease.    ECHO: 01/02/2021 IMPRESSIONS   1. Akinesis of the distal anterior, anteroseptal, inferior and apical  walls; overall severe LV dysfunction.   2. Left ventricular ejection fraction, by estimation, is 25 to 30%. The  left ventricle has severely decreased function. The left ventricle  demonstrates regional wall motion abnormalities (see scoring  diagram/findings for description). Left ventricular  diastolic parameters are consistent with Grade III diastolic dysfunction  (restrictive). Elevated left atrial pressure.   3. Right ventricular systolic function is normal. The right ventricular  size is mildly enlarged. There is mildly elevated pulmonary artery  systolic pressure.   4. The mitral valve is normal in structure. Mild mitral valve  regurgitation. No evidence of mitral stenosis.   5. The aortic valve is tricuspid. Aortic valve regurgitation is trivial.  Mild to moderate aortic valve sclerosis/calcification is present, without  any evidence of aortic stenosis.   6. The inferior vena cava is normal in size with greater than 50%  respiratory variability, suggesting right atrial pressure of 3 mmHg.   ASSESSMENT:    1. HFrEF (heart failure with reduced ejection fraction) ([place])   2. Coronary artery disease involving native coronary artery  of native heart without angina pectoris   3. S/P CABG x 3   4. Ischemic cardiomyopathy   5. Grade III diastolic dysfunction   6. Hyperlipidemia with target LDL less than 70   7. Ankle edema: improved     PLAN:  Mr. Paul [name] is a very pleasant 82 -year-old gentleman who had developed progressive exertional shortness of breath leading to hypoxia requiring prompt hospital admission to [facility] in [place] in March 2022.  Initial BNP was 1255.  CTA of his chest was negative for PE but showed pulmonary edema with bilateral effusions.  Cardiac catheterization on August 18, 2020  showed severe multivessel CAD and ultimately underwent successful CABG revascularization surgery by Dr. [name] on August 23, 2020.  His postop course was complicated by atrial fibrillation with RVR for which he required amiodarone.  He also developed bilateral effusions and ultimately required subsequent thoracentesis in May 2022.  When I saw him for his initial evaluation with me on December 12, 2020 he was feeling significantly improved.  He did not have any recurrent anginal symptomatology.  He was breathing well.  He continued to have some  lower extremity edema which had been treated with furosemide.  Following his office visit, a follow-up chest x-ray showed significant improvement in his previous pleural effusion.  A follow-up echo Doppler study on January 02, 2021 demonstrated significant LV dysfunction with EF at 25 to 30%.  He had LVH and grade 3 diastolic dysfunction  consistent with restrictive physiology.  At his January 20, 2021 evaluation, he was started on Entresto 24/26, his Lasix was reduced to 20 mg daily and he was started on low-dose metoprolol succinate 12.5 mg.  Subsequently, he has been started on SGLT2 inhibition with Farxiga 10 mg.  He is without chest pain.  He is breathing better and completed cardiac rehab.  Presently he does not have significant edema.  I have recommended he discontinue furosemide in  addition to potassium chloride.  I have suggested initiation of low-dose spironolactone at 12.5 mg.  In 6 weeks he will undergo a bmet in proBNP.  He continues to be on atorvastatin 40 mg for hyperlipidemia with LDL cholesterol decreased to 46 in July 2022.  We will monitor his blood pressure.  If he continues to stabilize and blood pressure and renal function remain adequate on future visit Entresto will be further titrated.   Medication Adjustments/Labs and Tests Ordered: Current medicines are reviewed at length with the patient today.  Concerns regarding medicines are outlined above.  Medication changes, Labs and Tests ordered today are listed in the Patient Instructions below. Patient Instructions  Medication Instructions:  Stop Lasix  Start Spironolactone 12.5 mg daily   *If you need a refill on your cardiac medications before your next appointment, please call your pharmacy*   Lab Work: BMET, ProBNP (in 6 weeks, no lab appointment needed)  If you have labs (blood work) drawn today and your tests are completely normal, you will receive your results only by: [place] (if you have MyChart) OR A paper copy in the mail If you have any lab test that is abnormal or we need to change your treatment, we will call you to review the results.   Follow-Up: At [facility], you and your health needs are our priority.  As part of our continuing mission to provide you with exceptional heart care, we have created designated Provider Care Teams.  These Care Teams include your primary Cardiologist (physician) and Advanced Practice Providers (APPs -  Physician Assistants and Nurse Practitioners) who all work together to provide you with the care you need, when you need it.  We recommend signing up for the patient portal called "MyChart".  Sign up information is provided on this After Visit Summary.  MyChart is used to connect with patients for Virtual Visits (Telemedicine).  Patients are able to  view lab/test results, encounter notes, upcoming appointments, etc.  Non-urgent messages can be sent to your provider as well.   To learn more about what you can do with MyChart, go to [email or web address].    Your next appointment:   November 17th at 10:40 AM    Signed, [name], MD  03/25/2021 7:39 PM    [facility] [address] [place], [place], [place]  [address] Phone: [phone]

## 2021-03-25 ENCOUNTER — Encounter: Payer: Self-pay | Admitting: Cardiovascular Disease

## 2021-04-25 LAB — BASIC METABOLIC PANEL
BUN/Creatinine Ratio: 18 (ref 10–24)
BUN: 22 mg/dL (ref 8–27)
CO2: 26 mmol/L (ref 20–29)
Calcium: 9.5 mg/dL (ref 8.6–10.2)
Chloride: 100 mmol/L (ref 96–106)
Creatinine, Ser: 1.24 mg/dL (ref 0.76–1.27)
Glucose: 73 mg/dL (ref 70–99)
Potassium: 5.1 mmol/L (ref 3.5–5.2)
Sodium: 141 mmol/L (ref 134–144)
eGFR: 59 mL/min/{1.73_m2} — ABNORMAL LOW (ref >59)

## 2021-04-25 LAB — PRO B NATRIURETIC PEPTIDE: NT-Pro BNP: 1302 pg/mL — ABNORMAL HIGH (ref 0–486)

## 2021-05-01 ENCOUNTER — Other Ambulatory Visit: Payer: Self-pay

## 2021-05-01 ENCOUNTER — Ambulatory Visit: Payer: Medicare Other | Admitting: Cardiovascular Disease

## 2021-05-06 ENCOUNTER — Telehealth: Payer: Self-pay

## 2021-05-06 NOTE — Telephone Encounter (Signed)
Letter has been sent to patient instructing them to call us if they are still interested in completing their sleep study. If we have not received a response from the patient within 30 days of this notice, the order will be cancelled and they will need to discuss the need for a sleep study at their next office visit.  ° °

## 2021-05-14 ENCOUNTER — Telehealth: Payer: Self-pay | Admitting: Cardiovascular Disease

## 2021-05-14 NOTE — Telephone Encounter (Signed)
Patient calling the office for samples of medication:   1.  What medication and dosage are you requesting samples for? sacubitril-valsartan (ENTRESTO) 24-26 MG  2.  Are you currently out of this medication? Pt is almost out of this medicine

## 2021-05-14 NOTE — Telephone Encounter (Signed)
Pt's daughter Vernona Rieger is f/u on pt assistance paperwork for [employer]

## 2021-05-15 NOTE — Telephone Encounter (Signed)
Called pt to let him know there will be samples at the front desk for him. He will have his daughter pick them up.

## 2021-05-15 NOTE — Telephone Encounter (Signed)
Looks like they're working on patient assistance.  Please let them know I will put a 2 week supply at the front for them to pick up

## 2021-05-27 ENCOUNTER — Other Ambulatory Visit (HOSPITAL_BASED_OUTPATIENT_CLINIC_OR_DEPARTMENT_OTHER): Payer: Self-pay | Admitting: Cardiovascular Disease

## 2021-05-30 NOTE — Telephone Encounter (Signed)
Patient with [facility] 12/23- will discuss at that visit in regards to patient assistance, I reviewed and still do not see any recent paperwork for this patient.   Thanks!

## 2021-06-04 ENCOUNTER — Telehealth: Payer: Self-pay | Admitting: Cardiovascular Disease

## 2021-06-04 NOTE — Telephone Encounter (Signed)
Called patient daughter back. She states that they mailed the patient assistance paperwork back in the second time. I have checked over my things, as this was mailed back a while ago according to daughter.. I seen in recent documentation Haleigh, CMA had sent this back out and advised to bring back- did this come back to you when they returned them? She states that sent them back in, but I have not seen them come through my things.   Thanks!   Advised patient I would see what I could figure out- as she states this is the second time and this paperwork has information on it that they do not want to continue to get lost.

## 2021-06-04 NOTE — Telephone Encounter (Signed)
° °  Pt's daughter calling, she is following up pt assistance for pt for entresto and farxiga. She said pt resend it and they have not heard anything yet. She said have a meeting between 9 am - 10 am but call her before or after that time frame

## 2021-06-05 ENCOUNTER — Other Ambulatory Visit: Payer: Self-pay | Admitting: Cardiovascular Disease

## 2021-06-05 DIAGNOSIS — I10 Essential (primary) hypertension: Secondary | ICD-10-CM

## 2021-06-06 ENCOUNTER — Ambulatory Visit: Payer: Medicare Other | Admitting: Cardiovascular Disease

## 2021-06-09 ENCOUNTER — Other Ambulatory Visit: Payer: Self-pay | Admitting: Cardiovascular Disease

## 2021-06-10 NOTE — Telephone Encounter (Signed)
Contacted patient last week,spoke with daughter- advised we did not have paperwor, it was scanned into chart, however after calling and speaking with patient assistance they stated it should be updated for the new year anyway. I advised of samples up front, and placed patient assistance for both medications into the bag, they will bring with them when they come on 01/06 visit with Dr.Kelly so I can get them faxed.  Daughter verbalized understanding.

## 2021-06-20 ENCOUNTER — Other Ambulatory Visit: Payer: Self-pay

## 2021-06-20 ENCOUNTER — Ambulatory Visit (INDEPENDENT_AMBULATORY_CARE_PROVIDER_SITE_OTHER): Payer: Medicare Other | Admitting: Cardiovascular Disease

## 2021-06-20 ENCOUNTER — Encounter: Payer: Self-pay | Admitting: Cardiovascular Disease

## 2021-06-20 VITALS — BP 126/68 | HR 73 | Ht 71.0 in | Wt 155.8 lb

## 2021-06-20 DIAGNOSIS — I5020 Unspecified systolic (congestive) heart failure: Secondary | ICD-10-CM | POA: Diagnosis not present

## 2021-06-20 DIAGNOSIS — I2510 Atherosclerotic heart disease of native coronary artery without angina pectoris: Secondary | ICD-10-CM

## 2021-06-20 DIAGNOSIS — Z951 Presence of aortocoronary bypass graft: Secondary | ICD-10-CM | POA: Diagnosis not present

## 2021-06-20 DIAGNOSIS — Z79899 Other long term (current) drug therapy: Secondary | ICD-10-CM

## 2021-06-20 DIAGNOSIS — I255 Ischemic cardiomyopathy: Secondary | ICD-10-CM | POA: Diagnosis not present

## 2021-06-20 DIAGNOSIS — E785 Hyperlipidemia, unspecified: Secondary | ICD-10-CM

## 2021-06-20 MED ORDER — SPIRONOLACTONE 25 MG PO TABS: 12.5000 mg | ORAL_TABLET | ORAL | 3 refills | Status: DC

## 2021-06-20 NOTE — Progress Notes (Signed)
Cardiology Office Note    Date:  06/20/2021   ID:  Paul [name], DOB [birth year], MRN [id number]  PCP:  [name], MD  Cardiologist:  [name], MD   3 month follow-up office visit   History of Present Illness:  Paul [name] is a 81 y.o. male who is from [place].  I saw him when he was admitted to [facility] on August 18, 2020.  At that time he had a 23-monthhistory of episodic exertionally precipitated chest discomfort.  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  [facility] heparin[facility]  I recommended he undergo a cardiac MRI to assess for LV viability.  A 2D echo Doppler study showed reduced LV function with EF at 30 to 35%.  He was found to have grade 3 diastolic dysfunction and significant increased pulmonary artery systolic pressure at 70 mm with moderate tricuspid regurgitation.  He ultimately underwent CABG revascularization surgery x3 by Dr. BGilford Raidand had a LIMA graft placed to the OM1, SVG to OM 2, and SVG to the posterolateral vessel on August 23, 2021.  He returned to the OR the following day for expectation of being studied for bleeding.  Postoperative course was complicated by volume overload and PAF with RVR for which she was placed on amiodarone therapy.  He also required midodrine for hypotension.  He was subsequently seen for his initial hospital visit by [name] PA on September 11, 2020.  The patient has been seen by Dr. BCyndia Bentand had  developed a left pleural effusion and required left thoracentesis on Nov 05, 2020.  575 cc of dark red fluid was removed.  Follow-up chest x-ray did not reveal any pneumothorax.  There was a small residual effusion which remained.  It was recommended he continue Lasix 40 mg daily.  I saw him for my initial evaluation with me follow his hospitalization on December 12, 2020.  At that time he was here [place] a [place]cologist[place]  He was scheduled to undergo a follow-up chest x-ray today but since he was running behind he did not get this done prior to his office visit.  He denied any recurrence of chest pain or shortness of breath and [place]  At times he had experienced lower extremity edema around his ankles.  He was unaware of palpitations.  His breathing has improved.  His ECG showed sinus rhythm with QS complex consistent with his anteroseptal myocardial infarction.  With his leg edema I suggested he continue furosemide 20 mg twice a day.  I recommended he undergo a follow-up echo Doppler study to reassess LV systolic and diastolic function as well as pulmonary pressures.  He had stage III CKD with creatinine 1.45 and follow-up laboratory was recommended.  He underwent a follow-up chest x-ray following his office visit which showed improvement in his left pleural effusion compared to his prior November 15, 2019 evaluation.  On January 02, 2021 he underwent a follow-up  echo Doppler study.  EF continues to be severely reduced at 25 to 30% with grade 3 diastolic dysfunction consistent with restrictive physiology.  He had elevated left atrial pressure.  There was mild mitral regurgitation and mild to moderate aortic sclerosis without stenosis.  Estimated RV systolic pressure was 23.5 mm.  He continues to participate in cardiac rehab.  He denies any chest pain.    I saw him for an office evaluation on January 20, 2021.  At that time, he was breathing better and since his edema was significantly improved I recommended he reduce Lasix from 20 mg twice a day to 20 mg daily.  At that time I initiated Entresto at 24/26 mg twice a day and provided him with samples.  Recommended he discontinue his potassium supplementation.  With his resting pulse of 76 I added low-dose metoprolol succinate at 12.5 mg.  I recommended he reduce his aspirin to 81 mg.  He continues to be on atorvastatin 40 mg for hyperlipidemia.  I recommended that he follow-up with our pharmacist in 3 to 4 weeks after initiation of Entresto.  As result, he was subsequently evaluated on February 03, 2021.  Evaluation due to low blood pressure readings his Delene Loll was not increased.  He was started on SGLT2 inhibition was started on Farxiga 10 mg.  I last saw him on March 18, 2021.  He participated in cardiac rehab and derived significant benefit.  He states his blood pressures at home typically run around 361-443 systolically.  He denied recurrent chest pain or edema or shortness of breath.  During that evaluation, his blood pressure remained low and he did not have significant edema.  I recommended he discontinue furosemide in addition to potassium chloride.  I suggested the addition of low-dose spironolactone 12.5 mg.  Creatinine was 1.24.  Potassium was 5.1.  Since I last saw him, he underwent laboratory in April 24, 2021.  NT proBNP was 06/17/2000.  He has participated in cardiac rehab.  He is here with his daughter today.  He has a record log of his blood pressures and his blood pressures at home typically run in the 100-1 05 range.  He apparently had a urinary tract infection requiring antibiotic therapy in November.  He denies any chest pain.  Presently, he is on aspirin 81 mg, Entresto 24/26 twice a day, Farxiga 10 mg daily, metoprolol 12.5 mg daily.  He does not have any significant leg swelling.  He presents for evaluation.  Past Medical History:   Diagnosis Date   Glaucoma    High blood pressure    High cholesterol     Past Surgical History:  Procedure Laterality Date   CARDIAC CATHETERIZATION  08/19/2020   CHEST EXPLORATION N/A 08/23/2020   Procedure: RE- EXPLORATION POST CABG;  Surgeon: Gaye Pollack, MD;  Location: [facility] OR;  Service: Thoracic;  Laterality: N/A;   CORONARY ARTERY BYPASS GRAFT N/A 08/23/2020   Procedure: CORONARY ARTERY BYPASS GRAFTING (CABG) X   THREE , LEFT INTERNAL MAMMARY HARVEST, RIGHT LEG ENDOSCOPIC SAPHENOUS VEIN HARVESTING, LEFT ENDOSCOPIC SAPHENOUS VEIN HARVEST;  Surgeon: Gaye Pollack, MD;  Location: [facility];  Service: Open Heart Surgery;  Laterality: N/A;   IR THORACENTESIS ASP PLEURAL SPACE W/IMG GUIDE  11/05/2020   TEE WITHOUT CARDIOVERSION N/A 08/23/2020   Procedure: TRANSESOPHAGEAL ECHOCARDIOGRAM (TEE);  Surgeon: Gaye Pollack, MD;  Location: [facility];  Service: Open Heart Surgery;  Laterality: N/A;   WRIST FRACTURE SURGERY Right    "years  ago"    Current Medications: Outpatient Medications Prior to Visit  Medication Sig Dispense Refill   aspirin EC 81 MG tablet Take 1 tablet (81 mg total) by mouth daily. Swallow whole. 90 tablet 3   atorvastatin (LIPITOR) 40 MG tablet Take 1 tablet (40 mg total) by mouth daily. 90 tablet 3   ENTRESTO 24-26 MG TAKE 1 TABLET BY MOUTH TWICE A DAY 60 tablet 4   FARXIGA 10 MG TABS tablet TAKE 1 TABLET BY MOUTH DAILY BEFORE BREAKFAST. 30 tablet 3   finasteride (PROSCAR) 5 MG tablet Take 1 tablet (5 mg total) by mouth daily. 30 tablet 1   latanoprost (XALATAN) 0.005 % ophthalmic solution Place 1 drop into both eyes at bedtime.     metoprolol succinate (TOPROL XL) 25 MG 24 hr tablet Take 0.5 tablets (12.5 mg total) by mouth daily. 45 tablet 3   Multiple Vitamin (MULTIVITAMIN WITH MINERALS) TABS tablet Take 1 tablet by mouth daily.     potassium chloride (KLOR-CON) 10 MEQ tablet TAKE 1 TABLET BY MOUTH TWICE A DAY (Patient not taking: Reported on 06/20/2021) 180 tablet 3    spironolactone (ALDACTONE) 25 MG tablet Take 0.5 tablets (12.5 mg total) by mouth daily. (Patient not taking: Reported on 06/20/2021) 60 tablet 2   No facility-administered medications prior to visit.     Allergies:   Patient has no known allergies.   Social History   Socioeconomic History   Marital status: Widowed    Spouse name: Not on file   Number of children: Not on file   Years of education: Not on file   Highest education level: Not on file  Occupational History   Not on file  Tobacco Use   Smoking status: Never   Smokeless tobacco: Never  Substance and Sexual Activity   Alcohol use: Not Currently   Drug use: Never   Sexual activity: Not on file  Other Topics Concern   Not on file  Social History Narrative   Not on file   Social Determinants of Health   Financial Resource Strain: Not on file  Food Insecurity: Not on file  Transportation Needs: Not on file  Physical Activity: Not on file  Stress: Not on file  Social Connections: Not on file     Family History:  The patient's family history is not on file.  Parents are deceased.  ROS General: Negative; No fevers, chills, or night sweats;  HEENT: Negative; No changes in vision or hearing, sinus congestion, difficulty swallowing Pulmonary: Negative; No cough, wheezing, shortness of breath, hemoptysis Cardiovascular: See HPI GI: Negative; No nausea, vomiting, diarrhea, or abdominal pain GU: Negative; No dysuria, hematuria, or difficulty voiding Musculoskeletal: Negative; no myalgias, joint pain, or weakness Hematologic/Oncology: Negative; no easy bruising, bleeding Endocrine: Negative; no heat/cold intolerance; no diabetes Neuro: Negative; no changes in balance, headaches Skin: Negative; No rashes or skin lesions Psychiatric: Negative; No behavioral problems, depression Sleep: Negative; No snoring, daytime sleepiness, hypersomnolence, bruxism, restless legs, hypnogognic hallucinations, no cataplexy Other  comprehensive 14 point system review is negative.   PHYSICAL EXAM:   VS:  BP 126/68    Pulse 73    Ht '5\' 11"'  (1.803 m)    Wt 155 lb 12.8 oz (70.7 kg)    SpO2 98%    BMI 21.73 kg/m     Repeat blood pressure by me was 128/70.  However blood pressures at home according to his daughters log typically are around 100 to less than 110, but on rare  occasions have gone into the 90s  Wt Readings from Last 3 Encounters:  06/20/21 155 lb 12.8 oz (70.7 kg)  03/18/21 152 lb (68.9 kg)  02/03/21 149 lb 3.2 oz (67.7 kg)    General: Alert, oriented, no distress.  Skin: normal turgor, no rashes, warm and dry HEENT: Normocephalic, atraumatic. Pupils equal round and reactive to light; sclera anicteric; extraocular muscles intact;  Nose without nasal septal hypertrophy Mouth/Parynx benign; Mallinpatti scale 2 Neck: No JVD, no carotid bruits; normal carotid upstroke Lungs: clear to ausculatation and percussion; no wheezing or rales Chest wall: without tenderness to palpitation Heart: PMI not displaced, RRR, s1 s2 normal, 1/6 systolic murmur, no diastolic murmur, no rubs, gallops, thrills, or heaves Abdomen: soft, nontender; no hepatosplenomehaly, BS+; abdominal aorta nontender and not dilated by palpation. Back: no CVA tenderness Pulses 2+ Musculoskeletal: full range of motion, normal strength, no joint deformities Extremities: no clubbing cyanosis or edema, Homan's sign negative  Neurologic: grossly nonfocal; Cranial nerves grossly wnl Psychologic: Normal mood and affect    Studies/Labs Reviewed:   June 20, 2021 ECG (independently read by me): NSR at 73; T wave inversion V4-6  March 18, 2021 ECG (independently read by me):  NSR at 63, QS V1-3, T inversion V1-5, QTc 446 msec  January 20, 2021 ECG (independently read by me): Normal sinus rhythm at 69 bpm.  Evidence for prior anteroseptal MI with QS complex V1 through V3.  Mild T wave abnormalities.  Normal intervals.  No ectopy  Recent Labs: BMP  Latest Ref Rng & Units 04/24/2021 01/28/2021 01/03/2021  Glucose 70 - 99 mg/dL 73 101(H) 96  BUN 8 - 27 mg/dL '22 15 26  ' Creatinine 0.76 - 1.27 mg/dL 1.24 1.20 1.49(H)  BUN/Creat Ratio 10 - '24 18 13 17  ' Sodium 134 - 144 mmol/L 141 140 141  Potassium 3.5 - 5.2 mmol/L 5.1 4.1 4.5  Chloride 96 - 106 mmol/L 100 99 100  CO2 20 - 29 mmol/L 26 31(H) 27  Calcium 8.6 - 10.2 mg/dL 9.5 9.2 9.3     Hepatic Function Latest Ref Rng & Units 01/28/2021 01/03/2021 09/01/2020  Total Protein 6.0 - 8.5 g/dL 6.9 7.4 6.7  Albumin 3.7 - 4.7 g/dL 4.0 3.9 3.2(L)  AST 0 - 40 IU/L '15 18 17  ' ALT 0 - 44 IU/L '12 14 14  ' Alk Phosphatase 44 - 121 IU/L 87 98 61  Total Bilirubin 0.0 - 1.2 mg/dL 0.8 0.6 1.3(H)    CBC Latest Ref Rng & Units 01/03/2021 09/11/2020 09/01/2020  WBC 3.4 - 10.8 x10E3/uL 7.1 6.9 14.8(H)  Hemoglobin 13.0 - 17.7 g/dL 13.4 10.4(L) 10.0(L)  Hematocrit 37.5 - 51.0 % 40.5 31.8(L) 32.2(L)  Platelets 150 - 450 x10E3/uL 238 409 412(H)   Lab Results  Component Value Date   MCV 87 01/03/2021   MCV 96 09/11/2020   MCV 104.2 (H) 09/01/2020   Lab Results  Component Value Date   TSH 3.790 01/03/2021   Lab Results  Component Value Date   HGBA1C 6.2 (H) 08/19/2020     BNP    Component Value Date/Time   BNP 1,109.1 (H) 08/19/2020 0358    ProBNP    Component Value Date/Time   PROBNP 1,302 (H) 04/24/2021 1155     Lipid Panel     Component Value Date/Time   CHOL 107 01/03/2021 0808   TRIG 98 01/03/2021 0808   HDL 42 01/03/2021 0808   CHOLHDL 2.5 01/03/2021 0808   CHOLHDL 2.6 08/19/2020 0358  VLDL 16 08/19/2020 0358   LDLCALC 46 01/03/2021 0808   LABVLDL 19 01/03/2021 0808     RADIOLOGY: No results found.   Additional studies/ records that were reviewed today include:    LHC/CA: 08/18/2020 Hemodynamics: Aortic pressure 91/46, LV pressure 91, LVEDP 79mHg LM: distal 50% stenosis LAD: proximal 75-80% stenosis. Mid LAD subtotal stenosis with faint filling, finally TIMI-2 filling of  distal LAD. - Diagonal 1 25-30% stenosis, not a large vessel - Diagonal 2 ostial 8% stenosis LCX: mid vessel with 99% stenosis with ulceration - OM1: proximal 75% stenosis - OM2: small vessel RCA: dominant vessel. Mid RCA with 99% stenosis, jump bridging collaterals which were right -right collaterals. RCA collateralized LAD  Echo 08/19/2020  1. Left ventricular ejection fraction, by estimation, is 25 to 30%. The  left ventricle has severely decreased function.   2. The left ventricle demonstrates regional wall motion abnormalities .  All mid-to-apical septal, mid-to-apical anterolateral, mid-to-apical  anterior, apical inferior and apex appear akinetic. The rest of the LV  walls are hypokinetic.   3. There is mild concentric left ventricular hypertrophy. Left  ventricular diastolic parameters are consistent with Grade II diastolic  dysfunction (pseudonormalization).   4. Swirling of contrast seen in the left ventricle consistent with low  flow state. No LV thrombus visualized.   5. Right ventricular systolic function is normal. The right ventricular  size is normal.   6. The mitral valve is abnormal. Trivial mitral valve regurgitation. No  evidence of mitral stenosis. Moderate mitral annular calcification.   7. The aortic valve has an indeterminant number of cusps. There is  moderate calcification of the aortic valve. There is moderate thickening  of the aortic valve. Aortic valve regurgitation is not visualized. Mild  aortic valve stenosis. Gradients low due  to low output state.   8. Wall motion concerning for multivessel coronary artery disease.    ECHO: 01/02/2021 IMPRESSIONS   1. Akinesis of the distal anterior, anteroseptal, inferior and apical  walls; overall severe LV dysfunction.   2. Left ventricular ejection fraction, by estimation, is 25 to 30%. The  left ventricle has severely decreased function. The left ventricle  demonstrates regional wall motion abnormalities (see  scoring  diagram/findings for description). Left ventricular  diastolic parameters are consistent with Grade III diastolic dysfunction  (restrictive). Elevated left atrial pressure.   3. Right ventricular systolic function is normal. The right ventricular  size is mildly enlarged. There is mildly elevated pulmonary artery  systolic pressure.   4. The mitral valve is normal in structure. Mild mitral valve  regurgitation. No evidence of mitral stenosis.   5. The aortic valve is tricuspid. Aortic valve regurgitation is trivial.  Mild to moderate aortic valve sclerosis/calcification is present, without  any evidence of aortic stenosis.   6. The inferior vena cava is normal in size with greater than 50%  respiratory variability, suggesting right atrial pressure of 3 mmHg.   ASSESSMENT:    1. HFrEF (heart failure with reduced ejection fraction) ([place]   2. Coronary artery disease involving native coronary artery of native heart without angina pectoris   3. S/P CABG x 3   4. Ischemic cardiomyopathy   5. Medication management   6. Hyperlipidemia with target LDL less than 70     PLAN:  Mr. WDamare Seranois a very pleasant 81year-old gentleman who had developed progressive exertional shortness of breath leading to hypoxia [facility] [place] [facility]  Initial BNP was 1255.  CTA of his chest was negative for PE but showed pulmonary edema with bilateral effusions.  Cardiac catheterization on August 18, 2020  showed severe multivessel CAD and ultimately underwent successful CABG revascularization surgery by Dr. [name] on August 23, 2020.  His postop course was complicated by atrial fibrillation with RVR for which he required amiodarone.  He also developed bilateral effusions and ultimately required subsequent thoracentesis in May 2022.  When I saw him for his initial evaluation with me on December 12, 2020 he was feeling significantly improved.  He has  not had recurrent anginal symptoms.  He has been successfully started on Entresto 24/26 mg twice a day and has tolerated the addition of Farxiga 10 mg in addition to metoprolol succinate 12.5 mg daily.  A proBNP in November remained elevated at 1302.  In November creatinine was 1.24.  With his blood pressure recording still low at home I do not believe at this time we should titrate Entresto to 49/59 mg.  However, I will rechallenge spironolactone at 12.5 mg every third day for additional aldosterone blockade.  He will need follow-up laboratory with a comprehensive metabolic panel, CBC and proBNP in approximately 1 to 2 weeks to make certain renal function and potassium are stable.  Clinically he appears euvolemic.  His ECG shows stable rhythm with sinus rhythm at 73 bpm.  He continues to be on atorvastatin 40 mg with LDL cholesterol at 46 in July 2022.  I will see him in 2 to 3 months for reevaluation or sooner as needed.   Medication Adjustments/Labs and Tests Ordered: Current medicines are reviewed at length with the patient today.  Concerns regarding medicines are outlined above.  Medication changes, Labs and Tests ordered today are listed in the Patient Instructions below. Patient Instructions  Medication Instructions:  START SPIRONOLACTONE 12.5MG EVERY 3 DAYS  *If you need a refill on your cardiac medications before your next appointment, please call your pharmacy*  Lab Work:        IN 2 WEEKS (07-04-21) CMET, CBC AND PRO-BNP-THIS IS NON-FASTING  Follow-Up: Your next appointment:  2-3 month(s) In Person with [name], MD   At [facility], you and your health needs are our priority.  As part of our continuing mission to provide you with exceptional heart care, we have created designated Provider Care Teams.  These Care Teams include your primary Cardiologist (physician) and Advanced Practice Providers (APPs -  Physician Assistants and Nurse Practitioners) who all work together to provide  you with the care you need, when you need it.  We recommend signing up for the patient portal called "MyChart".  Sign up information is provided on this After Visit Summary.  MyChart is used to connect with patients for Virtual Visits (Telemedicine).  Patients are able to view lab/test results, encounter notes, upcoming appointments, etc.  Non-urgent messages can be sent to your provider as well.   To learn more about what you can do with MyChart, go to [email or web address].       Signed, [name], MD  06/20/2021 1:03 PM    [facility] [address], [place], [place], [place]  [address] Phone: [phone]

## 2021-06-20 NOTE — Patient Instructions (Signed)
Medication Instructions:  START SPIRONOLACTONE 12.5MG  EVERY 3 DAYS  *If you need a refill on your cardiac medications before your next appointment, please call your pharmacy*  Lab Work:        IN 2 WEEKS (07-04-21) CMET, CBC AND PRO-BNP-THIS IS NON-FASTING  Follow-Up: Your next appointment:  2-3 month(s) In Person with Nicki Guadalajara, MD   At [facility], you and your health needs are our priority.  As part of our continuing mission to provide you with exceptional heart care, we have created designated Provider Care Teams.  These Care Teams include your primary Cardiologist (physician) and Advanced Practice Providers (APPs -  Physician Assistants and Nurse Practitioners) who all work together to provide you with the care you need, when you need it.  We recommend signing up for the patient portal called "MyChart".  Sign up information is provided on this After Visit Summary.  MyChart is used to connect with patients for Virtual Visits (Telemedicine).  Patients are able to view lab/test results, encounter notes, upcoming appointments, etc.  Non-urgent messages can be sent to your provider as well.   To learn more about what you can do with MyChart, go to [email or web address].

## 2021-06-24 NOTE — Telephone Encounter (Signed)
Called patient, advised that in regards to his patient assistance, I have the signed forms, however I am missing his POI- I need that information the first two pages of his Federal tax return.. patient aware, he will notify his daughter and will get this information to [place].

## 2021-06-26 ENCOUNTER — Telehealth: Payer: Self-pay | Admitting: Cardiovascular Disease

## 2021-06-26 NOTE — Telephone Encounter (Signed)
Pt c/o medication issue:  1. Name of Medication: spironolactone (ALDACTONE) 25 MG tablet  2. How are you currently taking this medication (dosage and times per day)? SCRIPT HAVE NOT YET BEEN FILLED  3. Are you having a reaction (difficulty breathing--STAT)?   4. What is your medication issue? PT'S DAUGHTER LAURA WOULD LIKE TO VERIFY HOW PT IS SUPPOSED TO BE TAKING THIS MEDICINE.

## 2021-06-26 NOTE — Telephone Encounter (Signed)
Spoke with daughter who had a question about the spironolactone order for patient. Order clarified with Dr. Claiborne Billings that patient is to take spironolactone 12.5 mg every third day (twice a week). Also discussed with daughter about limiting potasium-rich foods. Daughter verbalized understanding of this conversation.

## 2021-07-02 NOTE — Telephone Encounter (Signed)
I received POI information- I faxed over both applications, [employer] and [employer] with POI information today 01/18-  Will await response on this information.

## 2021-07-07 NOTE — Telephone Encounter (Signed)
Received information from [employer] that patient was approved from 07/03/2021-06/14/2022.  Will scan approval into chart.

## 2021-07-11 ENCOUNTER — Telehealth: Payer: Self-pay | Admitting: Cardiovascular Disease

## 2021-07-11 NOTE — Telephone Encounter (Signed)
Pt c/o medication issue:  1. Name of Medication: FARXIGA 10 MG TABS tablet  2. How are you currently taking this medication (dosage and times per day)? As directed  3. Are you having a reaction (difficulty breathing--STAT)? no  4. What is your medication issue? Cost  Patient paid $500 for the medication today. His Daughter faxed a w2 for the patient so he could get financial assistance. She was wondering what the status of that application is

## 2021-07-11 NOTE — Telephone Encounter (Signed)
Called patient daughter, advised that I had received notification from [employer] Marcelline Deist) that he was approved. However, I had not heard back from [place] at this time. I did give call back number to return call if needed.

## 2021-07-11 NOTE — Telephone Encounter (Signed)
Called pt's daughter, she states the pt faxed over the W-2 today. She states her father had to pay $500 today and it could have been done back in the fall but the papers were lost by the office. She just wants to know the status of his application. Will forward to [place] for further assistance. Needs to know about [place] and Entresto.

## 2021-07-12 LAB — COMPREHENSIVE METABOLIC PANEL
ALT: 14 IU/L (ref 0–44)
AST: 16 IU/L (ref 0–40)
Albumin/Globulin Ratio: 1.4 (ref 1.2–2.2)
Albumin: 4.2 g/dL (ref 3.7–4.7)
Alkaline Phosphatase: 73 IU/L (ref 44–121)
BUN/Creatinine Ratio: 13 (ref 10–24)
BUN: 16 mg/dL (ref 8–27)
Bilirubin Total: 0.9 mg/dL (ref 0.0–1.2)
CO2: 27 mmol/L (ref 20–29)
Calcium: 9.8 mg/dL (ref 8.6–10.2)
Chloride: 100 mmol/L (ref 96–106)
Creatinine, Ser: 1.21 mg/dL (ref 0.76–1.27)
Globulin, Total: 3.1 g/dL (ref 1.5–4.5)
Glucose: 91 mg/dL (ref 70–99)
Potassium: 4.7 mmol/L (ref 3.5–5.2)
Sodium: 147 mmol/L — ABNORMAL HIGH (ref 134–144)
Total Protein: 7.3 g/dL (ref 6.0–8.5)
eGFR: 61 mL/min/{1.73_m2} (ref >59)

## 2021-07-12 LAB — CBC
Hematocrit: 42.8 % (ref 37.5–51.0)
Hemoglobin: 15.3 g/dL (ref 13.0–17.7)
MCH: 34.8 pg — ABNORMAL HIGH (ref 26.6–33.0)
MCHC: 35.7 g/dL (ref 31.5–35.7)
MCV: 97 fL (ref 79–97)
Platelets: 253 10*3/uL (ref 150–450)
RBC: 4.4 x10E6/uL (ref 4.14–5.80)
RDW: 13.1 % (ref 11.6–15.4)
WBC: 7.6 10*3/uL (ref 3.4–10.8)

## 2021-07-12 LAB — PRO B NATRIURETIC PEPTIDE: NT-Pro BNP: 1299 pg/mL — ABNORMAL HIGH (ref 0–486)

## 2021-07-16 NOTE — Telephone Encounter (Signed)
ok 

## 2021-07-18 ENCOUNTER — Telehealth: Payer: Self-pay | Admitting: Cardiovascular Disease

## 2021-07-18 NOTE — Telephone Encounter (Signed)
Spoke to patient's daughter Vernona Rieger.Stated she wanted Raynelle Fanning to know father has not received [place] yet.She wanted her to email her the access code.Stated she would like to talk to [place].Advised I will send message to her.

## 2021-07-18 NOTE — Telephone Encounter (Signed)
Daughter is calling because patient hasnt received his FARXIGA 10 MG TABS tablet prescription through the program. Please advise

## 2021-07-18 NOTE — Telephone Encounter (Signed)
Patient states he is returning a call.

## 2021-07-28 NOTE — Telephone Encounter (Signed)
Patient's daughter is returning.  Her father states he received a call from [place] but was left a message.

## 2021-07-28 NOTE — Telephone Encounter (Signed)
Patient's daughter calling to speak to [place]. She was calling to see if there was an update on [place] assistance. She has been given the number to call [employer] to check on it as well.   She was also calling to see if she has any samples.   Medication Samples have been provided to the patient.  Drug name: Sherryll Burger       Strength: 24-26 mg        Qty: One bottle  LOT: [id number]  Exp.Date: 2/25

## 2021-07-29 ENCOUNTER — Telehealth: Payer: Self-pay | Admitting: Cardiovascular Disease

## 2021-07-29 NOTE — Telephone Encounter (Signed)
See phone note from 07/18/21  Patient's daughter spoke to [employer] regarding her father's entresto. She stated that the lady she spoke with stated that they were waiting on the doctor's application and prescription. She is faxing over another form as of 20 minutes ago. I advised that I would make Dr. Tresa Endo and his nurse aware of the fax coming in.

## 2021-07-30 ENCOUNTER — Other Ambulatory Visit: Payer: Self-pay

## 2021-07-30 MED ORDER — ENTRESTO 24-26 MG PO TABS: 1.0000 | ORAL_TABLET | Freq: Two times a day (BID) | ORAL | 3 refills | Status: DC

## 2021-07-30 NOTE — Telephone Encounter (Signed)
Called patient, spoke with daughter- she advised that they were needing the provider portion and RX sent back to them.  I advised I would pull the application, and check the box to see what was going on and get it resent to them.   Patient daughter verbalized understanding,. Thankful for call back.

## 2021-07-30 NOTE — Telephone Encounter (Signed)
I contacted patient, see previous message.  Refaxed information today. 02/15

## 2021-08-07 ENCOUNTER — Telehealth: Payer: Self-pay | Admitting: Cardiovascular Disease

## 2021-08-07 NOTE — Telephone Encounter (Signed)
Spoke with patient regarding the following results. Patient made aware and patient verbalized understanding.   Lennette Bihari, MD  07/17/2021  4:47 PM EST     CBC improved; NT proBNP remains elevated but slightly improved from previously.  There was medication adjustment at his last office visit; renal function stable.  Potassium is normal at 4.7 with addition of low-dose every third day spironolactone

## 2021-08-07 NOTE — Telephone Encounter (Signed)
Returned call to patient's daughter who states that she was calling in regarding patients Entresto patient assistance application. Patients daughter states that she just wanted to check on the status of these forms. Advised her I would forward message to Raynelle Fanning, LPN to check on this. Patient's daughter verbalized understanding.

## 2021-08-07 NOTE — Telephone Encounter (Signed)
Patient states he is returning a call from [place] from a few days ago. He says it was something about following up with Dr. Claiborne Billings.

## 2021-08-07 NOTE — Telephone Encounter (Signed)
Patient's daughter is calling about his medication entresto.  She states the company that was going to give financial assistance states haven't received information.  She is just follow up on it.

## 2021-08-08 NOTE — Telephone Encounter (Signed)
Contacted patient daughter, advised that I had not heard or seen anything in regards to the [facility], so I would call to check.   I contacted Sherryll Burger- they advised name misplacement on the application, I updated the forms and faxed back over today 08/08/2021.  Called daughter back, advised of this- she will continue to monitor and keep me updated.  She thanked me for the call and the assistance.

## 2021-08-28 ENCOUNTER — Other Ambulatory Visit: Payer: Self-pay | Admitting: Physician Assistant

## 2021-09-16 ENCOUNTER — Telehealth: Payer: Self-pay | Admitting: Cardiovascular Disease

## 2021-09-16 NOTE — Telephone Encounter (Signed)
Paul [name] was approved by the financial so he can get it for free. He is yet to receive it in the mail.  Daughter told him to call the place that approved him to find out what is going on. The answer that they gave him was they are waiting on the prescription from the doctor.   ?

## 2021-09-16 NOTE — Telephone Encounter (Signed)
Returned the call to the daughter. She stated the patient was approved for the [facility] but he has not received the medication. The daughter stated that the prescription was sent in but they stated that they did not receive it.  ?

## 2021-09-16 NOTE — Telephone Encounter (Signed)
Received information shipment was sent out 09/10/21 ? ?

## 2021-09-16 NOTE — Telephone Encounter (Signed)
Contacted patient, advised I received information that shipment should be there this week, however if not- call me back and let me know I would contact them.  ? ?Patient verbalized understanding, thankful for call back. ? ?

## 2021-09-23 NOTE — Telephone Encounter (Addendum)
Called [employer] ?Patient is approved but they do not have prescription  ? ?Transferred to pharmacy line and provided verbal for Rx ?(P) [phone] - opt 3 ?Spoke with pharmacist to provide verbal order ?Will have to be linked to approval, but said would be escalated  ? ?Was advised patient can call back on Thursday if he has not been contacted about shipment  ?Phone: [phone] ?

## 2021-09-23 NOTE — Telephone Encounter (Signed)
Notified daughter of this update. She said that this same situation occurred about 4 weeks ago and Raynelle Fanning called in a prescription to [employer] also.  ? ?Will route to [place] as FYI ?

## 2021-09-23 NOTE — Telephone Encounter (Signed)
? ?  Pt's daughter calling because pt has not receive the entresto. She is calling to f/u ?

## 2021-09-25 NOTE — Telephone Encounter (Signed)
Contacted patient daughter after receiving call they had not heard anything in regards to the medication.  ?I contacted [employer] who stated it was pending, we did ask to have this shipped out. They did approve this through their pharmacy- it will be shipped to the address on file, should arrive 04/18.  ? ?Contacted daughter back and advised of this. They are coming into office on 04/19.  ? ?Patient daughter appreciative for the assistance.  ? ?

## 2021-09-30 ENCOUNTER — Telehealth: Payer: Self-pay | Admitting: Cardiovascular Disease

## 2021-09-30 NOTE — Telephone Encounter (Signed)
Daughter called to say that the patient hasnt received his  sacubitril-valsartan (ENTRESTO) 24-26 MG. Was told to call our office to let us know if he didn't received. Patient does have appt on tomorrow Please advise ?

## 2021-10-01 ENCOUNTER — Encounter: Payer: Self-pay | Admitting: Cardiovascular Disease

## 2021-10-01 ENCOUNTER — Ambulatory Visit (INDEPENDENT_AMBULATORY_CARE_PROVIDER_SITE_OTHER): Payer: Medicare Other | Admitting: Cardiovascular Disease

## 2021-10-01 VITALS — BP 126/68 | HR 71 | Ht 71.0 in | Wt 163.2 lb

## 2021-10-01 DIAGNOSIS — Z951 Presence of aortocoronary bypass graft: Secondary | ICD-10-CM | POA: Diagnosis not present

## 2021-10-01 DIAGNOSIS — I255 Ischemic cardiomyopathy: Secondary | ICD-10-CM

## 2021-10-01 DIAGNOSIS — I2510 Atherosclerotic heart disease of native coronary artery without angina pectoris: Secondary | ICD-10-CM

## 2021-10-01 DIAGNOSIS — E785 Hyperlipidemia, unspecified: Secondary | ICD-10-CM

## 2021-10-01 NOTE — Patient Instructions (Signed)
Medication Instructions:  ?The current medical regimen is effective;  continue present plan and medications as directed. Please refer to the Current Medication list given to you today.  ? ?*If you need a refill on your cardiac medications before your next appointment, please call your pharmacy* ? ?Lab Work:   Testing/Procedures:  ?NONE    NONE ? ?Follow-Up: ?Your next appointment:  4-6 month(s) In Person with [name], MD    ? ?Please call our office 2 months in advance to schedule this appointment  ? ?At [facility], you and your health needs are our priority.  As part of our continuing mission to provide you with exceptional heart care, we have created designated Provider Care Teams.  These Care Teams include your primary Cardiologist (physician) and Advanced Practice Providers (APPs -  Physician Assistants and Nurse Practitioners) who all work together to provide you with the care you need, when you need it. ? ?We recommend signing up for the patient portal called "MyChart".  Sign up information is provided on this After Visit Summary.  MyChart is used to connect with patients for Virtual Visits (Telemedicine).  Patients are able to view lab/test results, encounter notes, upcoming appointments, etc.  Non-urgent messages can be sent to your provider as well.   ?To learn more about what you can do with MyChart, go to [email or web address].   ? ? ? ?Important Information About [place] ? ? ? ? ? ? ? ?  ? ?

## 2021-10-01 NOTE — Telephone Encounter (Signed)
I contacted PAF- they gave tracking number for Entresto shipment- [phone]  ?I looked up on [employer]- tracking states it was left yesterday 04/18 at 2:10 at side door. Patient and daughter were in office today to see Dr.Kelly. printed out information, they will check at home when they get back.  ? ?Patient and daughter verbalized understanding, thankful for call back.  ? ?They also mentioned receiving a bill from [facility] in regards to a dx- I advised them to contact [facility] and have them send [place] any information so we could assist.  ? ?Daughter will do this.  ? ?

## 2021-10-01 NOTE — Progress Notes (Signed)
? ?Cardiology Office Note   ? ?Date:  10/01/2021  ? ?ID:  Paul [name], DOB 05/21/1941, MRN [id number] ? ?PCP:  Alinda Deem, MD  ?Cardiologist:  Nicki Guadalajara, MD  ? ?3 month follow-up office visit ? ? ?History of Present Illness:  ?Paul [name] is a 81 y.o. male who is from [place].  I saw him when he was admitted to [facility] on August 18, 2020.  At that time he had a 69-month history of episodic exertionally precipitated chest discomfort.  Initially he did not seek medical attention and [name] recently had seen his primary care provider and was hypoxic prompting hospitalization at [facility] in [place].  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  He was heparinized and transferred to [facility].  I recommended he undergo a cardiac MRI to assess for LV viability.  A 2D echo Doppler study showed reduced LV function with EF at 30 to 35%.  He was found to have grade 3 diastolic dysfunction and significant increased pulmonary artery systolic pressure at 70 mm with moderate tricuspid regurgitation.  He ultimately underwent CABG revascularization surgery x3 by Dr. Evelene Croon and had a LIMA graft placed to the OM1, SVG to OM 2, and SVG to the posterolateral vessel on August 23, 2021.  He returned to the OR the following day for expectation of being studied for bleeding.  Postoperative course was complicated by volume overload and PAF with RVR for which she was placed on amiodarone therapy.  He also required midodrine for hypotension.  He was subsequently seen for his initial hospital visit by Azalee Course, PA on September 11, 2020.  The patient has been seen by Dr. Laneta Simmers and had  developed a left pleural effusion and required left thoracentesis on Nov 05, 2020.  575 cc of dark red fluid was removed.  Follow-up chest x-ray did not reveal any pneumothorax.  There was a small residual effusion which remained.  It was recommended he continue Lasix 40 mg daily. ? ?I saw him for my initial evaluation with me follow his hospitalization on December 12, 2020.  At that time he was here with his daughter who lives in [place] and is a Midwife at [employer].  He was scheduled to undergo a follow-up chest x-ray today but since he was running behind he did not get this done prior to his office visit.  He denied any recurrence of chest pain or shortness of breath and has been participating in cardiac rehab in [place].  At times he had experienced lower extremity edema around his ankles.  He was unaware of palpitations.  His breathing has improved.  His ECG showed sinus rhythm with QS complex consistent with his anteroseptal myocardial infarction.  With his leg edema I suggested he continue furosemide 20 mg twice a day.  I recommended he undergo a follow-up echo Doppler study to reassess LV systolic and diastolic function as well as pulmonary pressures.  He had stage III CKD with creatinine 1.45 and follow-up laboratory was recommended. ? ?He underwent a follow-up chest x-ray following his office visit which showed improvement in his left pleural effusion compared to his prior November 15, 2019 evaluation. ? ?On January 02, 2021 he underwent a follow-up  echo Doppler study.  EF continues to be severely reduced at 25 to 30% with grade 3 diastolic dysfunction consistent with restrictive physiology.  He had elevated left atrial pressure.  There was mild mitral regurgitation and mild to moderate aortic sclerosis without stenosis.  Estimated RV systolic pressure was 42.4 mm.  He continues to participate in cardiac rehab.  He denies any chest pain.   ? ?I saw him for an office evaluation on January 20, 2021.  At that time, he was breathing better and since his edema was significantly improved I recommended he reduce Lasix from 20 mg twice a day to 20 mg daily.  At that time I initiated Entresto at 24/26 mg twice a day and provided him with samples.  Recommended he discontinue his potassium supplementation.  With his resting pulse of 76 I added low-dose metoprolol succinate at 12.5 mg.  I recommended he reduce his aspirin to 81 mg.  He continues to be on atorvastatin 40 mg for hyperlipidemia. ? ?I recommended that he follow-up with our pharmacist in 3 to 4 weeks after initiation of Entresto.  As result, he was subsequently evaluated on February 03, 2021.  Evaluation due to low blood pressure readings his Sherryll Burger was not increased.  He was started on SGLT2 inhibition was started on Farxiga 10 mg. ? ?I saw him on March 18, 2021.  He participated in cardiac rehab and derived significant benefit.  He states his blood pressures at home typically run around 100-108 systolically.  He denied recurrent chest pain or edema or shortness of breath.  During that evaluation, his blood pressure remained low and he did not have significant edema.  I recommended he discontinue furosemide in addition to potassium chloride.  I suggested the addition of low-dose spironolactone 12.5 mg.  Creatinine was 1.24.  Potassium was 5.1. ? ?I last saw him on June 20, 2021.  He underwent laboratory in April 24, 2021.  NT proBNP was 06/17/2000.  He has participated in cardiac rehab.  He is here with his daughter today.  He has a record log of his blood pressures and his blood pressures at home typically run in the 100-1 05 range.  He apparently had a urinary tract infection requiring antibiotic therapy in November.  He denies any chest pain.  Presently, he is on aspirin 81 mg, Entresto 24/26 twice a day, Farxiga 10 mg daily, metoprolol 12.5 mg daily.  He does not have any significant leg swelling.  ? ?Over the last several months, Mr. Debow  has continued to be asymptomatic.  He specifically denies any shortness of breath or chest pain.  He is walking at least 30 minutes every day.  He continues to be on Entresto 24/26 mg twice a day, metoprolol succinate 12.5 mg daily, Farxiga 10 mg daily, and now takes spironolactone 12.5 mg every third day.  Remotely he had a history of elevated potassium.  Most recent laboratory done on September 01, 2021 by Drs. Alinda Deem showed a creatinine of 1.18.  BUN 21.  Potassium 4.5.  LFTs are normal.  Lipid studies reveal total cholesterol 124, triglycerides 111, HDL 45, and LDL 59.  Hemoglobin 15.7 hematocrit 46.0. He presents for evaluation. ? ?Past Medical History:  ?Diagnosis Date  ? Glaucoma   ? High blood pressure   ? High cholesterol   ? ? ?Past Surgical History:  ?Procedure Laterality Date  ? CARDIAC CATHETERIZATION  08/19/2020  ? CHEST EXPLORATION N/A 08/23/2020  ? Procedure: RE- EXPLORATION POST  CABG;  Surgeon: Alleen BorneBartle, Bryan K, MD;  [facility];  Service: Thoracic;  Laterality: N/A;  ? CORONARY ARTERY BYPASS GRAFT N/A 08/23/2020  ? Procedure: CORONARY ARTERY BYPASS GRAFTING (CABG) X   THREE , LEFT INTERNAL MAMMARY HARVEST, RIGHT LEG ENDOSCOPIC SAPHENOUS VEIN HARVESTING, LEFT ENDOSCOPIC SAPHENOUS VEIN HARVEST;  Surgeon: Alleen BorneBartle, Bryan K, MD;  Location: [facility] OR;  Service: Open Heart Surgery;  Laterality: N/A;  ? IR THORACENTESIS ASP PLEURAL SPACE W/IMG GUIDE  11/05/2020  ? TEE WITHOUT CARDIOVERSION N/A 08/23/2020  ? Procedure: TRANSESOPHAGEAL ECHOCARDIOGRAM (TEE);  Surgeon: Alleen BorneBartle, Bryan K, MD;  [facility];  Service: Open Heart Surgery;  Laterality: N/A;  ? WRIST FRACTURE SURGERY Right   ? "years ago"  ? ? ?Current Medications: ?Outpatient Medications Prior to Visit  ?Medication Sig Dispense Refill  ? aspirin EC 81 MG tablet Take 1 tablet (81 mg total) by mouth daily. Swallow whole. 90 tablet 3  ? atorvastatin (LIPITOR) 40 MG tablet TAKE 1 TABLET BY MOUTH EVERY DAY 90 tablet 3  ? FARXIGA 10 MG TABS tablet TAKE  1 TABLET BY MOUTH DAILY BEFORE BREAKFAST. 30 tablet 3  ? finasteride (PROSCAR) 5 MG tablet Take 1 tablet (5 mg total) by mouth daily. 30 tablet 1  ? latanoprost (XALATAN) 0.005 % ophthalmic solution Place 1 dr

## 2021-10-02 ENCOUNTER — Telehealth: Payer: Self-pay | Admitting: Cardiovascular Disease

## 2021-10-02 NOTE — Telephone Encounter (Signed)
Contacted [facility]- they advised the labs were not seen as medical necessity. I advised we should change dx code as those labs were used to dose medication Delene Loll), we updated dx code to heart failure code, called patient daughter back advised that it could take 30-40 business days before they heard anything different or a new invoice was sent to them.  ?Patient daughter thankful for assistance and call back. ? ?

## 2021-10-02 NOTE — Telephone Encounter (Signed)
Patient's daughter called wanting to speak with Raynelle Fanning.  ?

## 2021-10-02 NOTE — Telephone Encounter (Signed)
Contacted daughter back.  ?We spoke about a bill in regards to [facility]- I advised them to contact [facility] and send [place] the information that was needed.  ?They advised that we had to call them. She provided the number to contact [phone].  ?  ?

## 2021-11-03 IMAGING — DX DG ABDOMEN 1V
1 series · 1 of 1 positions shown · non-contrast
Comparison: None.

CLINICAL DATA: Vomiting

EXAM:
ABDOMEN - 1 VIEW

[abdomen supine]
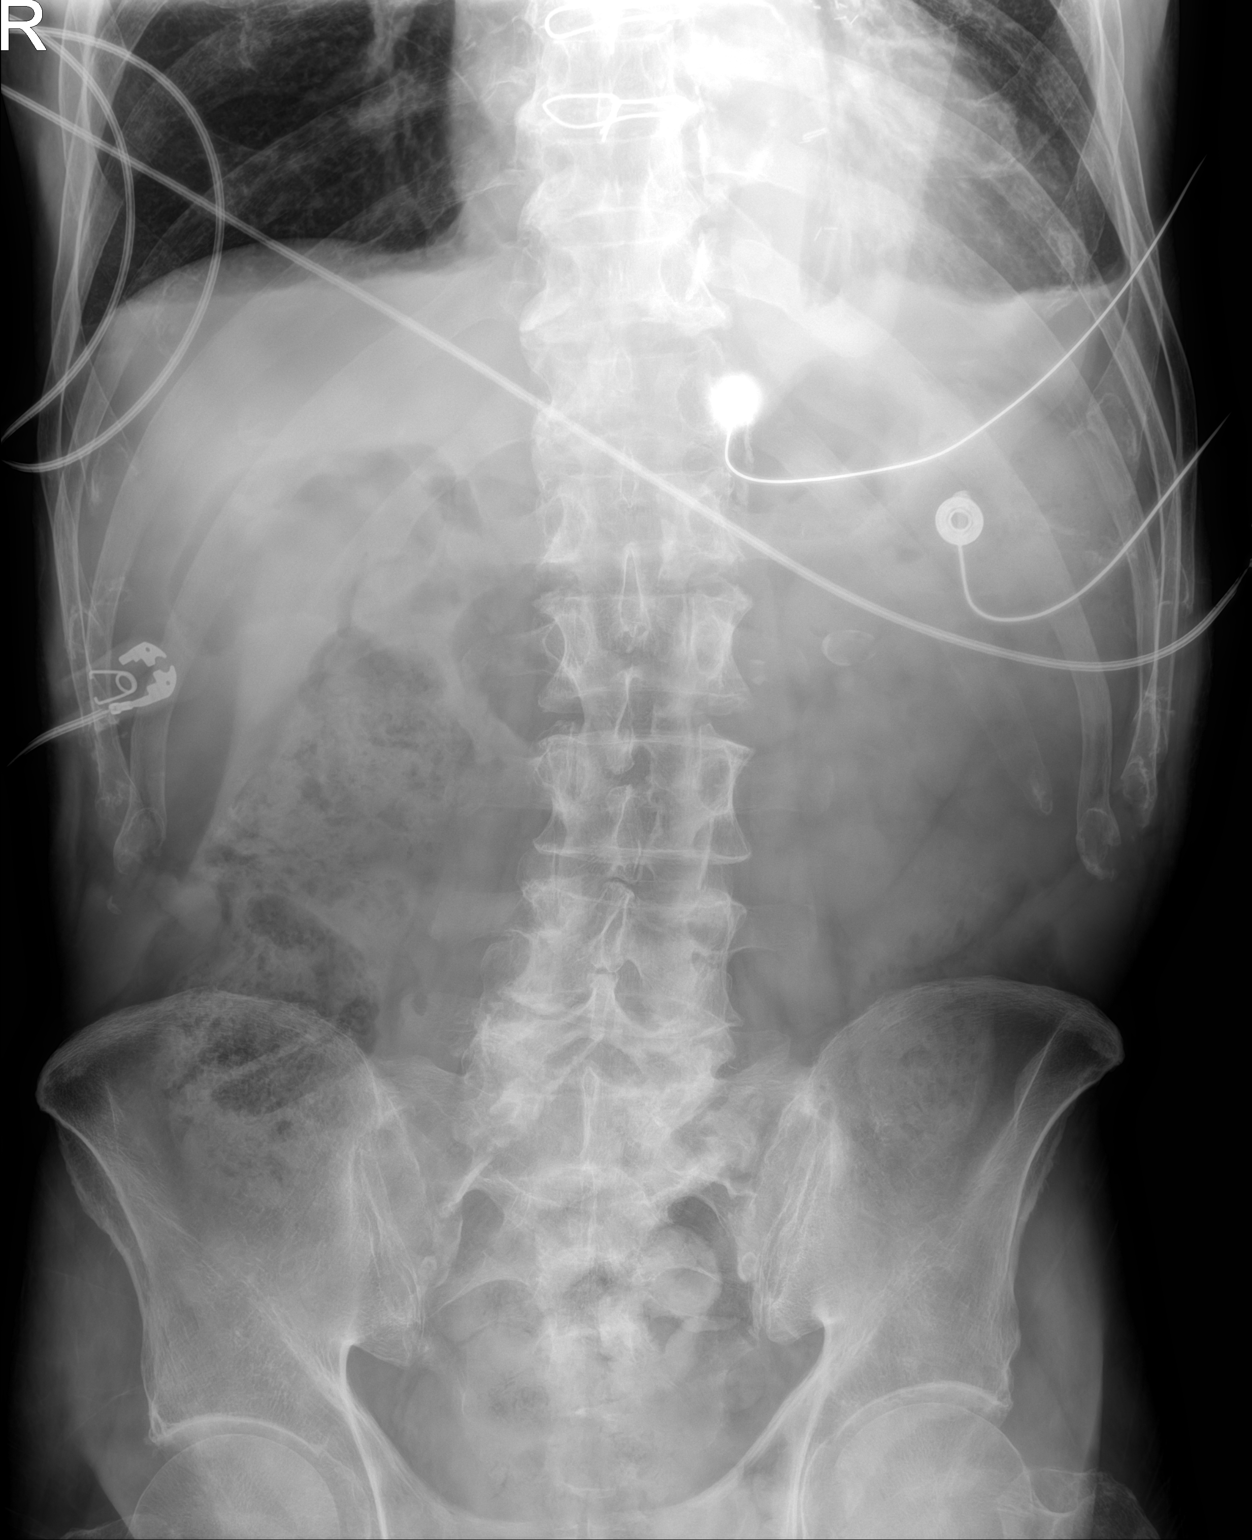

[1 of 1 positions shown; findings below may reference images not displayed]

FINDINGS: Nonobstructive bowel gas pattern. No evidence of ileus. No
organomegaly, free air or suspicious calcification.
IMPRESSION: No acute findings.

## 2021-11-28 IMAGING — DX DG CHEST 2V
2 series · 2 of 2 positions shown · non-contrast
Comparison: August 28, 2020

CLINICAL DATA: Status post coronary artery bypass grafting

EXAM:
CHEST - 2 VIEW

[dg chest 2 view (1 of 2)]
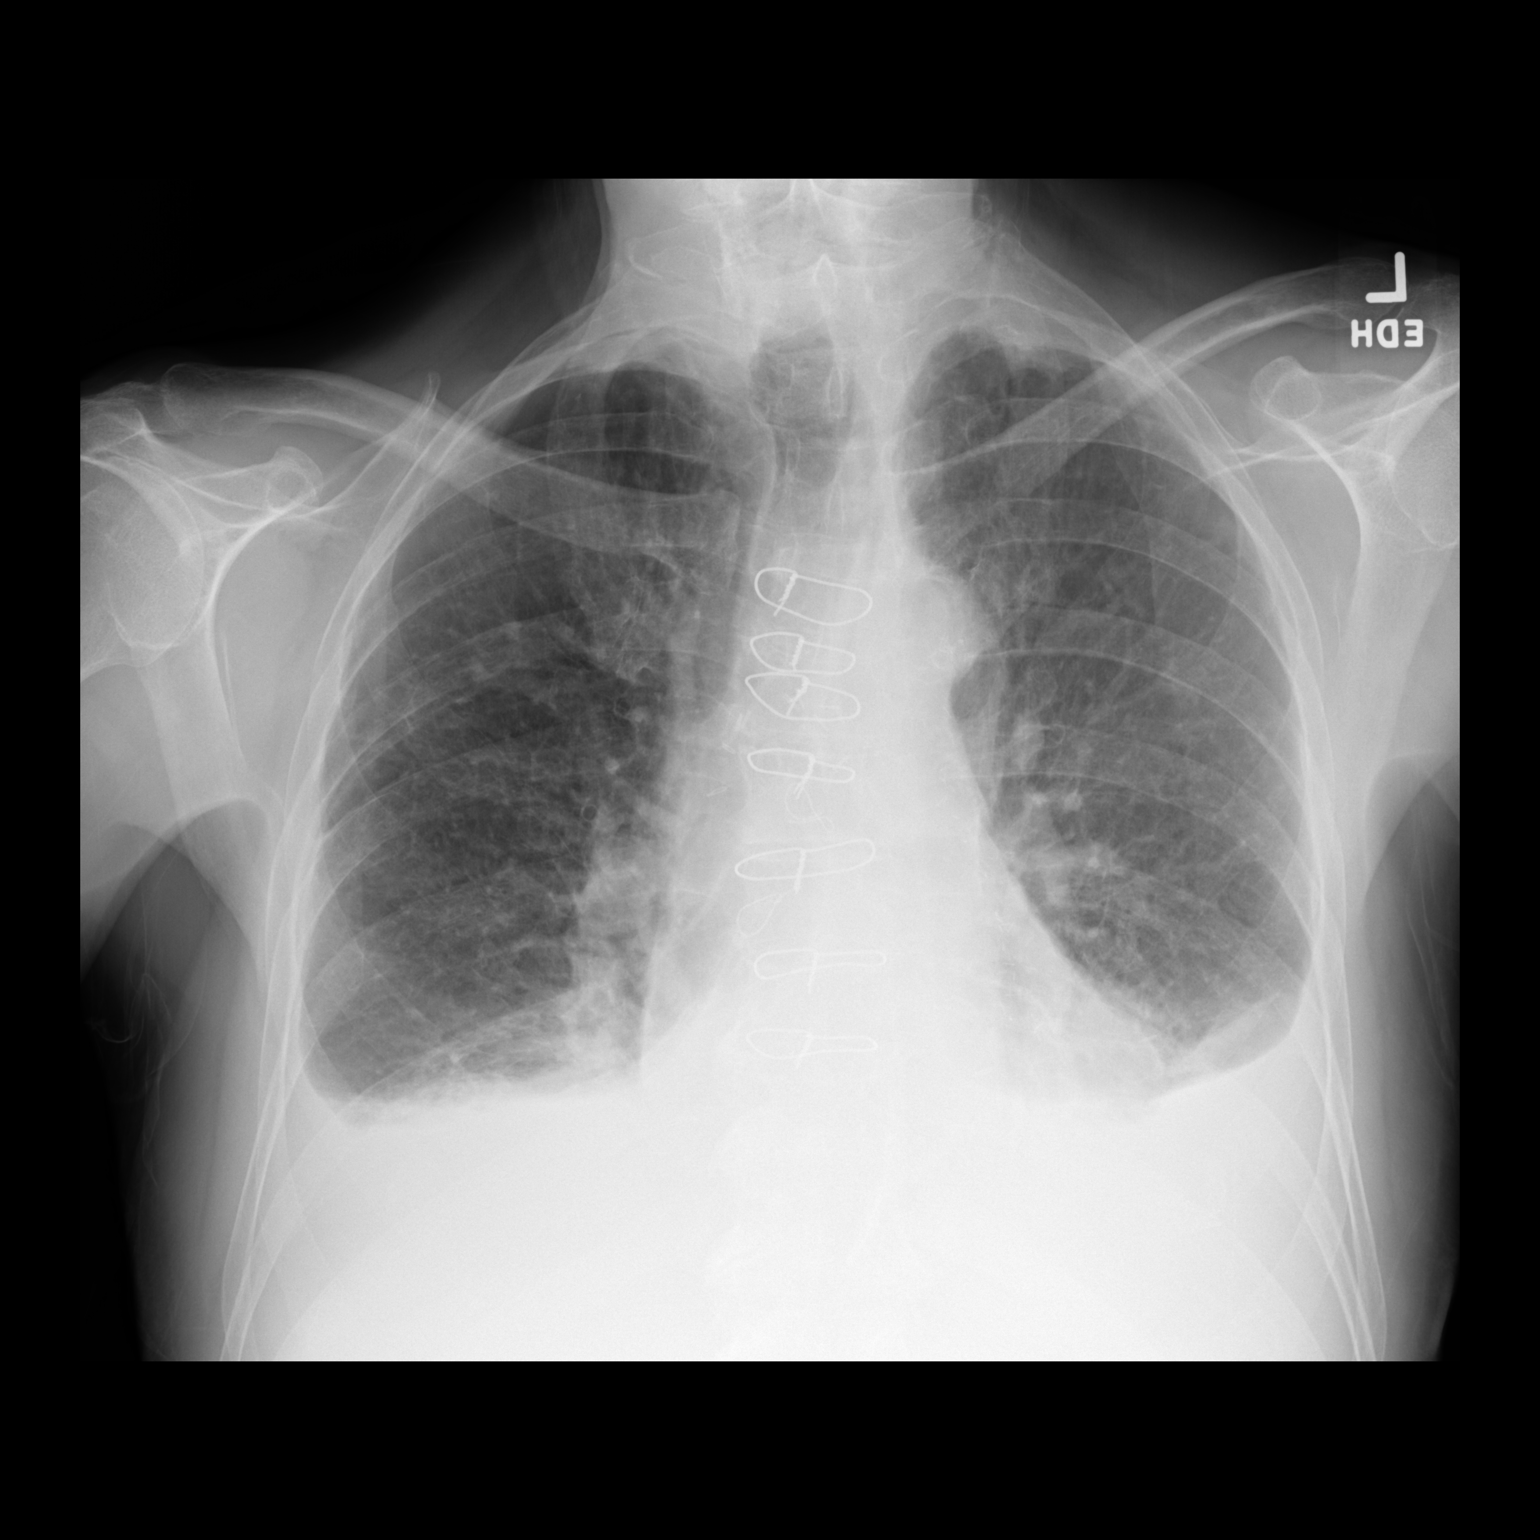

[dg chest 2 view (2 of 2)]
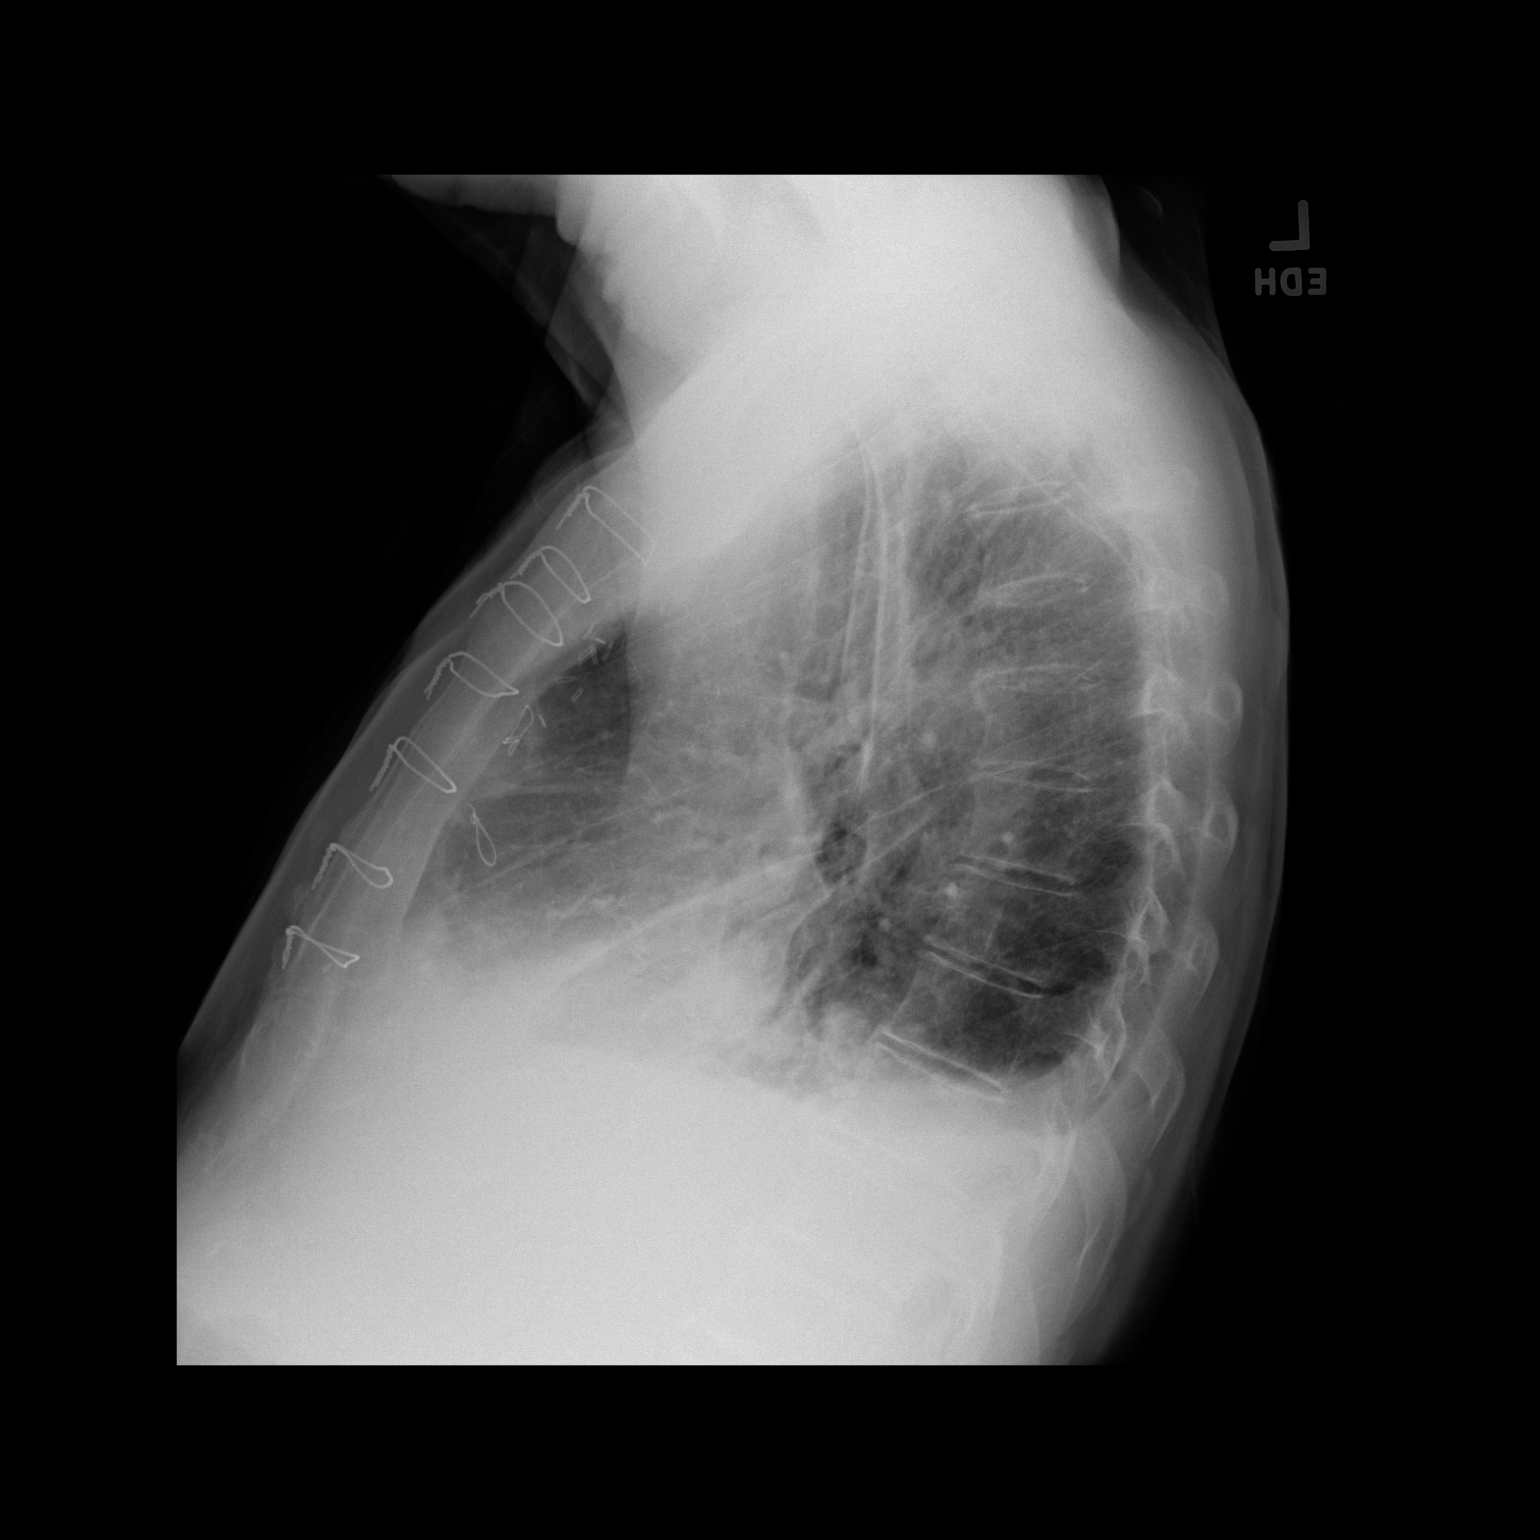

[2 of 2 positions shown; findings below may reference images not displayed]

FINDINGS: There are small pleural effusions bilaterally with bibasilar
atelectatic change. There is mild asymmetric pleural thickening on
the left. No pneumothorax evident. Heart size and pulmonary
vascularity are normal. Patient is status post coronary artery
bypass grafting. There is aortic atherosclerosis. No adenopathy. No
bone lesions.
IMPRESSION: No pneumothorax. Small pleural effusions bilaterally with bibasilar
atelectasis. Status post coronary artery bypass grafting. Heart size
normal.

Aortic Atherosclerosis ([id number].C).

## 2022-01-05 ENCOUNTER — Other Ambulatory Visit: Payer: Self-pay | Admitting: Cardiovascular Disease

## 2022-01-07 IMAGING — DX DG CHEST 1V PORT
1 series · 1 of 1 positions shown · non-contrast
Comparison: 10/31/2020

CLINICAL DATA: Status post thoracentesis on the left

EXAM:
PORTABLE CHEST 1 VIEW

[chest]
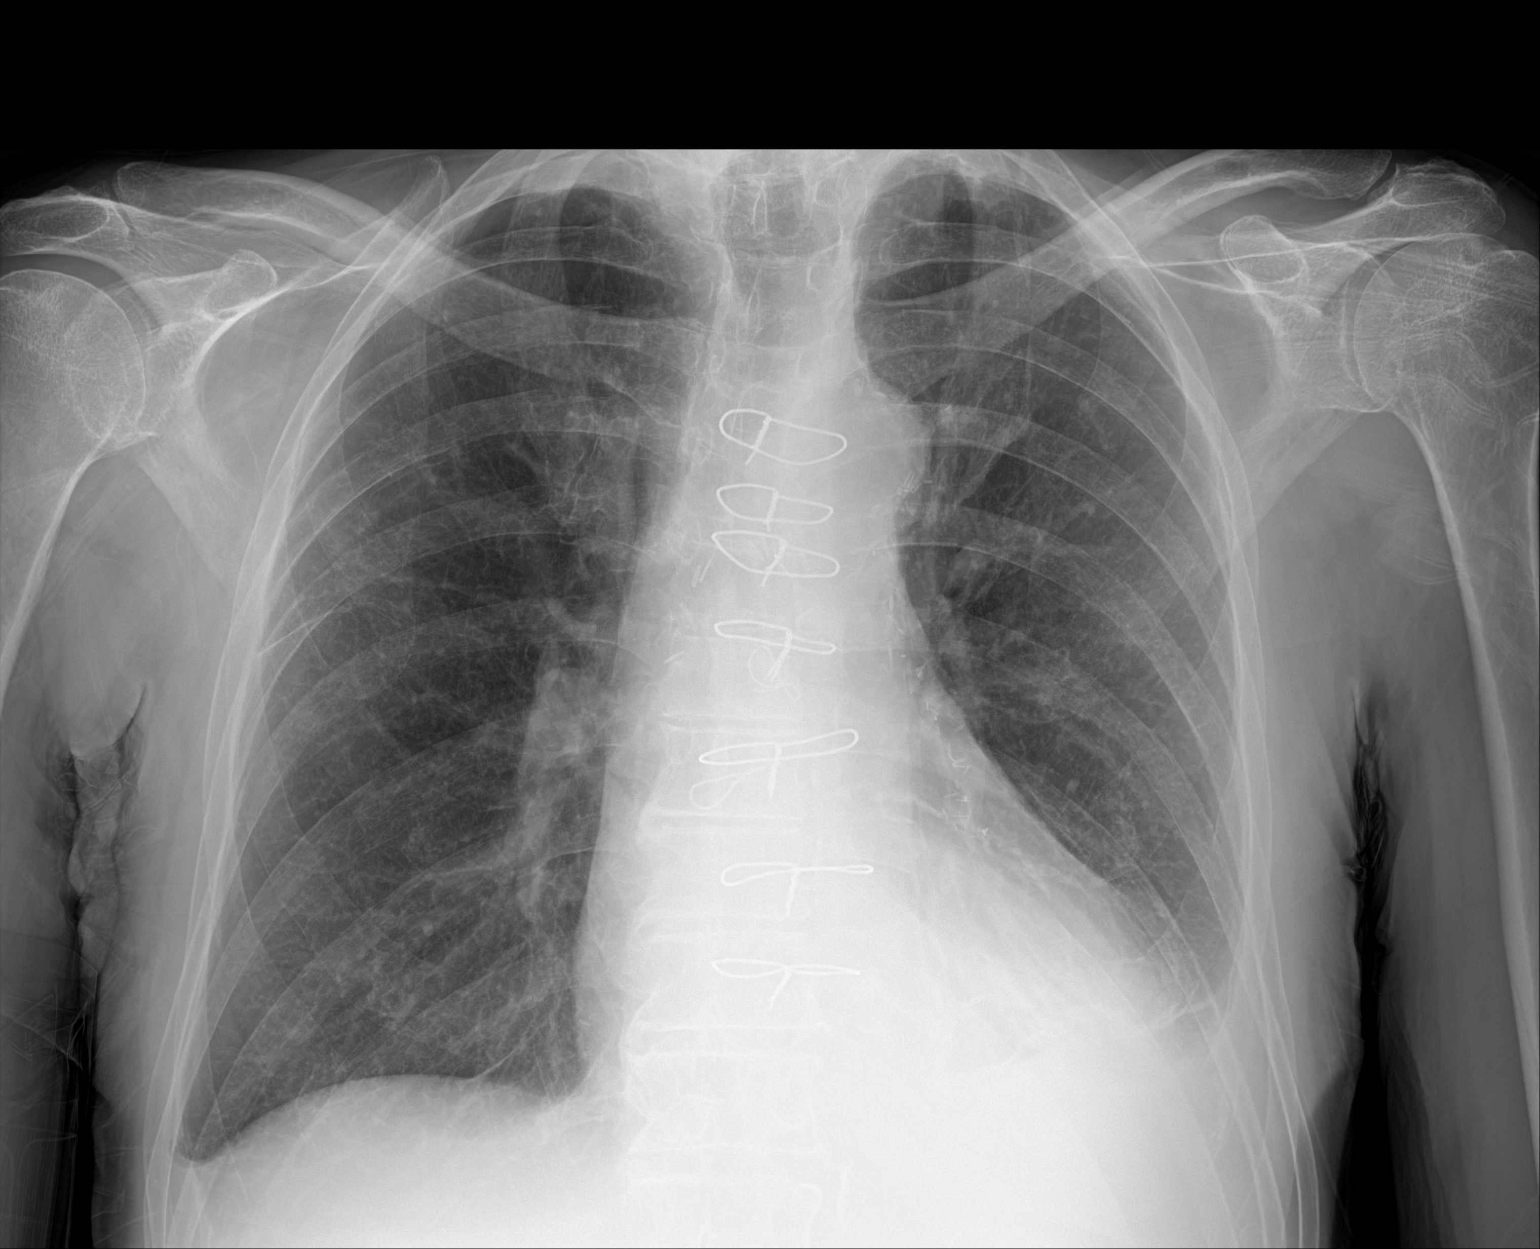

[1 of 1 positions shown; findings below may reference images not displayed]

FINDINGS: Cardiac shadow is at the upper limits of normal in size but stable.
Postsurgical changes are noted. Significant reduction in left-sided
pleural effusion is noted following thoracentesis. Minimal residual
fluid remains. No pneumothorax is noted. Right lung is clear. No
bony abnormality is noted.
IMPRESSION: No pneumothorax following thoracentesis on the left. Small residual
effusion remains.

## 2022-02-26 ENCOUNTER — Other Ambulatory Visit: Payer: Self-pay | Admitting: Cardiovascular Disease

## 2022-05-13 ENCOUNTER — Telehealth: Payer: Self-pay

## 2022-05-13 ENCOUNTER — Encounter: Payer: Self-pay | Admitting: Cardiovascular Disease

## 2022-05-13 ENCOUNTER — Ambulatory Visit: Payer: Medicare Other | Attending: Cardiovascular Disease | Admitting: Cardiovascular Disease

## 2022-05-13 VITALS — BP 136/82 | HR 66 | Ht 71.0 in | Wt 166.6 lb

## 2022-05-13 DIAGNOSIS — I2510 Atherosclerotic heart disease of native coronary artery without angina pectoris: Secondary | ICD-10-CM

## 2022-05-13 DIAGNOSIS — E785 Hyperlipidemia, unspecified: Secondary | ICD-10-CM | POA: Diagnosis present

## 2022-05-13 DIAGNOSIS — I5020 Unspecified systolic (congestive) heart failure: Secondary | ICD-10-CM

## 2022-05-13 DIAGNOSIS — I255 Ischemic cardiomyopathy: Secondary | ICD-10-CM | POA: Diagnosis not present

## 2022-05-13 DIAGNOSIS — Z951 Presence of aortocoronary bypass graft: Secondary | ICD-10-CM | POA: Diagnosis not present

## 2022-05-13 MED ORDER — SACUBITRIL-VALSARTAN 49-51 MG PO TABS
1.0000 | ORAL_TABLET | Freq: Two times a day (BID) | ORAL | 3 refills | Status: DC
Start: 2022-05-13 — End: 2023-05-25

## 2022-05-13 NOTE — Progress Notes (Unsigned)
Cardiology Office Note    Date:  05/14/2022   ID:  Paul [name], DOB [birth year], MRN [id number]  PCP:  [name], MD  Cardiologist:  [name], MD   7 month follow-up office visit   History of Present Illness:  Paul [name] is a 81 y.o. male who is from [place].  I saw him when he was admitted to [facility] on August 18, 2020.  At that time he had a 6-monthhistory of episodic exertionally precipitated chest discomfort.  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  [facility]  I recommended he undergo a cardiac MRI to assess for LV viability.  A 2D echo Doppler study showed reduced LV function with EF at 30 to 35%.  He was found to have grade 3 diastolic dysfunction and significant increased pulmonary artery systolic pressure at 70 mm with moderate tricuspid regurgitation.  He ultimately underwent CABG revascularization surgery x3 by Dr. BGilford Raidand had a LIMA graft placed to the OM1, SVG to OM 2, and SVG to the posterolateral vessel on August 23, 2021.  He returned to the OR the following day for expectation of being studied for bleeding.  Postoperative course was complicated by volume overload and PAF with RVR for which she was placed on amiodarone therapy.  He also required midodrine for hypotension.  He was subsequently seen for his initial hospital visit by [name] PA on September 11, 2020.  The patient has been seen by Dr. BCyndia Bentand had  developed a left pleural effusion and required left thoracentesis on Nov 05, 2020.  575 cc of dark red fluid was removed.  Follow-up chest x-ray did not reveal any pneumothorax.  There was a small residual effusion which remained.  It was recommended he continue Lasix 40 mg daily.  I saw him for my initial evaluation with me follow his hospitalization on December 12, 2020.  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  He denied any recurrence of chest pain or shortness of breath and [place]  At times he had experienced lower extremity edema around his ankles.  He was unaware of palpitations.  His breathing has improved.  His ECG showed sinus rhythm with QS complex consistent with his anteroseptal myocardial infarction.  With his leg edema I suggested he continue furosemide 20 mg twice a day.  I recommended he undergo a follow-up echo Doppler study to reassess LV systolic and diastolic function as well as pulmonary pressures.  He had stage III CKD with creatinine 1.45 and follow-up laboratory was recommended.  He underwent a follow-up chest x-ray following his office visit which showed improvement in his left pleural effusion compared to his prior November 15, 2019 evaluation.  On January 02, 2021 he underwent a follow-up echo Doppler study.  EF continues to be severely reduced at 25 to 30% with grade 3 diastolic dysfunction consistent with restrictive physiology.  He had elevated left atrial pressure.  There was mild mitral regurgitation and mild to moderate aortic sclerosis without stenosis.  Estimated RV systolic pressure was 02.1 mm.  He continues to participate in cardiac rehab.  He denies any chest pain.    I saw him for an office evaluation on January 20, 2021.  At that time, he was breathing better and since his edema was significantly improved I recommended he reduce Lasix from 20 mg twice a day to 20 mg daily.  At that time I initiated Entresto at 24/26 mg twice a day and provided him with samples.  Recommended he discontinue his potassium supplementation.  With his resting pulse of 76 I added low-dose metoprolol succinate at 12.5 mg.  I recommended he reduce his aspirin to 81 mg.  He continues to be on atorvastatin 40 mg for hyperlipidemia.  I recommended that he follow-up with our pharmacist in 3 to 4 weeks after initiation of Entresto.  As result, he was subsequently evaluated on February 03, 2021.  Evaluation due to low blood pressure readings his Delene Loll was not increased.  He was started on SGLT2 inhibition was started on Farxiga 10 mg.  I saw him on March 18, 2021.  He participated in cardiac rehab and derived significant benefit.  He states his blood pressures at home typically run around 115-520 systolically.  He denied recurrent chest pain or edema or shortness of breath.  During that evaluation, his blood pressure remained low and he did not have significant edema.  I recommended he discontinue furosemide in addition to potassium chloride.  I suggested the addition of low-dose spironolactone 12.5 mg.  Creatinine was 1.24.  Potassium was 5.1.  I saw him on June 20, 2021.  He underwent laboratory in April 24, 2021.  NT proBNP was 06/17/2000.  He has participated in cardiac rehab.  He is here with his daughter today.  He has a record log of his blood pressures and his blood pressures at home typically run in the 100-1 05 range.  He apparently had a urinary tract infection requiring antibiotic therapy in November.  He denies any chest pain.  Presently, he is on aspirin 81 mg, Entresto 24/26 twice a day, Farxiga 10 mg daily, metoprolol 12.5 mg daily.  He does not have any significant leg swelling.   When I last saw him on October 01, 2021 he  remained asymptomatic.  He specifically denied any shortness of breath or chest pain.  He was walking at least 30 minutes every day.  He continued to be on Entresto 24/26 mg twice a day, metoprolol succinate 12.5 mg daily, Farxiga 10 mg daily, and now takes spironolactone 12.5 mg every third day.  Remotely he had a history of elevated potassium.  Most recent laboratory done on September 01, 2021 by Drs. [name] showed a creatinine of 1.18.  BUN 21.  Potassium 4.5.  LFTs are normal.  Lipid studies reveal total cholesterol 124, triglycerides 111, HDL 45, and LDL 59.  Hemoglobin 15.7 hematocrit 46.0.   Since I last saw him, he has felt well.  He denies any dizziness or significant shortness of breath.  His blood pressure has become irregular and not low and typically may run around 1 80-2 23V systolically with pulse in the 70s.  Previously, he continued to  have elevated pro BNP blood pressure had been too low to further titrate Entresto.  He is here with his daughter.  He is unaware of any palpitations.  He uses support stockings daily.  At times there is trivial residual edema.  He continues to be on Entresto 24/26 mg twice a day, Farxiga 10 mg daily, metoprolol succinate 12.5 mg in addition to spironolactone 12.5 mg every third day.  He presents for evaluation.  Past Medical History:  Diagnosis Date   Glaucoma    High blood pressure    High cholesterol     Past Surgical History:  Procedure Laterality Date   CARDIAC CATHETERIZATION  08/19/2020   CHEST EXPLORATION N/A 08/23/2020   Procedure: RE- EXPLORATION POST CABG;  Surgeon: Gaye Pollack, MD;  Location: [facility];  Service: Thoracic;  Laterality: N/A;   CORONARY ARTERY BYPASS GRAFT N/A 08/23/2020   Procedure: CORONARY ARTERY BYPASS GRAFTING (CABG) X   THREE , LEFT INTERNAL MAMMARY HARVEST, RIGHT LEG ENDOSCOPIC SAPHENOUS VEIN HARVESTING, LEFT ENDOSCOPIC SAPHENOUS VEIN HARVEST;  Surgeon: Gaye Pollack, MD;  Location: [place];  Service: Open Heart  Surgery;  Laterality: N/A;   IR THORACENTESIS ASP PLEURAL SPACE W/IMG GUIDE  11/05/2020   TEE WITHOUT CARDIOVERSION N/A 08/23/2020   Procedure: TRANSESOPHAGEAL ECHOCARDIOGRAM (TEE);  Surgeon: Gaye Pollack, MD;  Location: [place];  Service: Open Heart Surgery;  Laterality: N/A;   WRIST FRACTURE SURGERY Right    "years ago"    Current Medications: Outpatient Medications Prior to Visit  Medication Sig Dispense Refill   aspirin EC 81 MG tablet Take 1 tablet (81 mg total) by mouth daily. Swallow whole. 90 tablet 3   atorvastatin (LIPITOR) 40 MG tablet TAKE 1 TABLET BY MOUTH EVERY DAY 90 tablet 3   FARXIGA 10 MG TABS tablet TAKE 1 TABLET BY MOUTH DAILY BEFORE BREAKFAST. 30 tablet 3   finasteride (PROSCAR) 5 MG tablet Take 1 tablet (5 mg total) by mouth daily. 30 tablet 1   latanoprost (XALATAN) 0.005 % ophthalmic solution Place 1 drop into both eyes at bedtime.     metoprolol succinate (TOPROL-XL) 25 MG 24 hr tablet TAKE 1/2 TABLET BY MOUTH EVERY DAY 45 tablet 3   Multiple Vitamin (MULTIVITAMIN WITH MINERALS) TABS tablet Take 1 tablet by mouth daily.     sacubitril-valsartan (ENTRESTO) 24-26 MG Take 1 tablet by mouth 2 (two) times daily. 180 tablet 3   spironolactone (ALDACTONE) 25 MG tablet Take 0.5 tablets (12.5 mg total) by mouth 2 (two) times a week. TAKE EVERY 3 DAYS 13 tablet 3   No facility-administered medications prior to visit.     Allergies:   Patient has no known allergies.   Social History   Socioeconomic History   Marital status: Widowed    Spouse name: Not on file   Number of children: Not on file   Years of education: Not on file   Highest education level: Not on file  Occupational History   Not on file  Tobacco Use   Smoking status: Never   Smokeless tobacco: Never  Substance and Sexual Activity   Alcohol use: Not Currently   Drug use: Never   Sexual activity: Not on file  Other Topics Concern   Not on file  Social History Narrative   Not on file   Social  Determinants of Health   Financial Resource Strain: Not on file  Food Insecurity: Not on file  Transportation Needs: Not on file  Physical Activity: Not on  file  Stress: Not on file  Social Connections: Not on file     Family History:  The patient's family history is not on file.  Parents are deceased.  ROS General: Negative; No fevers, chills, or night sweats;  HEENT: Negative; No changes in vision or hearing, sinus congestion, difficulty swallowing Pulmonary: Negative; No cough, wheezing, shortness of breath, hemoptysis Cardiovascular: See HPI GI: Negative; No nausea, vomiting, diarrhea, or abdominal pain GU: Negative; No dysuria, hematuria, or difficulty voiding Musculoskeletal: Negative; no myalgias, joint pain, or weakness Hematologic/Oncology: Negative; no easy bruising, bleeding Endocrine: Negative; no heat/cold intolerance; no diabetes Neuro: Negative; no changes in balance, headaches Skin: Negative; No rashes or skin lesions Psychiatric: Negative; No behavioral problems, depression Sleep: Negative; No snoring, daytime sleepiness, hypersomnolence, bruxism, restless legs, hypnogognic hallucinations, no cataplexy Other comprehensive 14 point system review is negative.   PHYSICAL EXAM:   VS:  BP 136/82   Pulse 66   Ht _0  (1.803 m)   Wt 166 lb 9.6 oz (75.6 kg)   SpO2 98%   BMI 23.24 kg/m     Repeat blood pressure by me was 132/80 supine and 136/80 standing.  Wt Readings from Last 3 Encounters:  05/13/22 166 lb 9.6 oz (75.6 kg)  10/01/21 163 lb 3.2 oz (74 kg)  06/20/21 155 lb 12.8 oz (70.7 kg)    General: Alert, oriented, no distress.  Skin: normal turgor, no rashes, warm and dry HEENT: Normocephalic, atraumatic. Pupils equal round and reactive to light; sclera anicteric; extraocular muscles intact;  Nose without nasal septal hypertrophy Mouth/Parynx benign; Mallinpatti scale 2/3 Neck: No JVD, no carotid bruits; normal carotid upstroke Lungs: clear to  ausculatation and percussion; no wheezing or rales Chest wall: without tenderness to palpitation Heart: PMI not displaced, RRR, s1 s2 normal, 1/6 systolic murmur, no diastolic murmur, no rubs, gallops, thrills, or heaves Abdomen: soft, nontender; no hepatosplenomehaly, BS+; abdominal aorta nontender and not dilated by palpation. Back: no CVA tenderness Pulses 2+ Musculoskeletal: full range of motion, normal strength, no joint deformities Extremities: no clubbing cyanosis or edema, Homan's sign negative  Neurologic: grossly nonfocal; Cranial nerves grossly wnl Psychologic: Normal mood and affect   Studies/Labs Reviewed:   May 13, 2022 ECG (independently read by me): NSR at 66, QS V1-3  October 01, 2021 ECG (independently read by me):  NSR at 71; mild T wave abnormality V2-4  June 20, 2021 ECG (independently read by me): NSR at 73; T wave inversion V4-6  March 18, 2021 ECG (independently read by me):  NSR at 63, QS V1-3, T inversion V1-5, QTc 446 msec  January 20, 2021 ECG (independently read by me): Normal sinus rhythm at 69 bpm.  Evidence for prior anteroseptal MI with QS complex V1 through V3.  Mild T wave abnormalities.  Normal intervals.  No ectopy  Recent Labs:    Latest Ref Rng & Units 07/11/2021   10:59 AM 04/24/2021   11:55 AM 01/28/2021   10:32 AM  BMP  Glucose 70 - 99 mg/dL 91  73  101   BUN 8 - 27 mg/dL _1 Creatinine 0.76 - 1.27 mg/dL 1.21  1.24  1.20   BUN/Creat Ratio 10 - _2 Sodium 134 - 144 mmol/L 147  141  140   Potassium 3.5 - 5.2 mmol/L 4.7  5.1  4.1   Chloride 96 - 106 mmol/L 100  100  99   CO2 20 -  29 mmol/L _0 Calcium 8.6 - 10.2 mg/dL 9.8  9.5  9.2         Latest Ref Rng & Units 07/11/2021   10:59 AM 01/28/2021   10:32 AM 01/03/2021    8:08 AM  Hepatic Function  Total Protein 6.0 - 8.5 g/dL 7.3  6.9  7.4   Albumin 3.7 - 4.7 g/dL 4.2  4.0  3.9   AST 0 - 40 IU/L _1 ALT 0 - 44 IU/L _2 Alk  Phosphatase 44 - 121 IU/L 73  87  98   Total Bilirubin 0.0 - 1.2 mg/dL 0.9  0.8  0.6        Latest Ref Rng & Units 07/11/2021   10:59 AM 01/03/2021    8:08 AM 09/11/2020    3:22 PM  CBC  WBC 3.4 - 10.8 x10E3/uL 7.6  7.1  6.9   Hemoglobin 13.0 - 17.7 g/dL 15.3  13.4  10.4   Hematocrit 37.5 - 51.0 % 42.8  40.5  31.8   Platelets 150 - 450 x10E3/uL 253  238  409    Lab Results  Component Value Date   MCV 97 07/11/2021   MCV 87 01/03/2021   MCV 96 09/11/2020   Lab Results  Component Value Date   TSH 3.790 01/03/2021   Lab Results  Component Value Date   HGBA1C 6.2 (H) 08/19/2020     BNP    Component Value Date/Time   BNP 1,109.1 (H) 08/19/2020 0358    ProBNP    Component Value Date/Time   PROBNP 1,299 (H) 07/11/2021 1059     Lipid Panel     Component Value Date/Time   CHOL 107 01/03/2021 0808   TRIG 98 01/03/2021 0808   HDL 42 01/03/2021 0808   CHOLHDL 2.5 01/03/2021 0808   CHOLHDL 2.6 08/19/2020 0358   VLDL 16 08/19/2020 0358   LDLCALC 46 01/03/2021 0808   LABVLDL 19 01/03/2021 0808     RADIOLOGY: No results found.   Additional studies/ records that were reviewed today include:    LHC: 08/18/2020 Hemodynamics: Aortic pressure 91/46, LV pressure 91, LVEDP 42mHg LM: distal 50% stenosis LAD: proximal 75-80% stenosis. Mid LAD subtotal stenosis with faint filling, finally TIMI-2 filling of distal LAD. - Diagonal 1 25-30% stenosis, not a large vessel - Diagonal 2 ostial 8% stenosis LCX: mid vessel with 99% stenosis with ulceration - OM1: proximal 75% stenosis - OM2: small vessel RCA: dominant vessel. Mid RCA with 99% stenosis, jump bridging collaterals which were right -right collaterals. RCA collateralized LAD  Echo 08/19/2020  1. Left ventricular ejection fraction, by estimation, is 25 to 30%. The  left ventricle has severely decreased function.   2. The left ventricle demonstrates regional wall motion abnormalities .  All mid-to-apical septal,  mid-to-apical anterolateral, mid-to-apical  anterior, apical inferior and apex appear akinetic. The rest of the LV  walls are hypokinetic.   3. There is mild concentric left ventricular hypertrophy. Left  ventricular diastolic parameters are consistent with Grade II diastolic  dysfunction (pseudonormalization).   4. Swirling of contrast seen in the left ventricle consistent with low  flow state. No LV thrombus visualized.   5. Right ventricular systolic function is normal. The right ventricular  size is normal.   6. The mitral valve is abnormal. Trivial mitral valve regurgitation. No  evidence of mitral stenosis. Moderate mitral annular calcification.  7. The aortic valve has an indeterminant number of cusps. There is  moderate calcification of the aortic valve. There is moderate thickening  of the aortic valve. Aortic valve regurgitation is not visualized. Mild  aortic valve stenosis. Gradients low due  to low output state.   8. Wall motion concerning for multivessel coronary artery disease.    ECHO: 01/02/2021 IMPRESSIONS   1. Akinesis of the distal anterior, anteroseptal, inferior and apical  walls; overall severe LV dysfunction.   2. Left ventricular ejection fraction, by estimation, is 25 to 30%. The  left ventricle has severely decreased function. The left ventricle  demonstrates regional wall motion abnormalities (see scoring  diagram/findings for description). Left ventricular  diastolic parameters are consistent with Grade III diastolic dysfunction  (restrictive). Elevated left atrial pressure.   3. Right ventricular systolic function is normal. The right ventricular  size is mildly enlarged. There is mildly elevated pulmonary artery  systolic pressure.   4. The mitral valve is normal in structure. Mild mitral valve  regurgitation. No evidence of mitral stenosis.   5. The aortic valve is tricuspid. Aortic valve regurgitation is trivial.  Mild to moderate aortic valve  sclerosis/calcification is present, without  any evidence of aortic stenosis.   6. The inferior vena cava is normal in size with greater than 50%  respiratory variability, suggesting right atrial pressure of 3 mmHg.   ASSESSMENT:    1. HFrEF (heart failure with reduced ejection fraction) ([place])   2. Coronary artery disease involving native coronary artery of native heart without angina pectoris   3. S/P CABG x 3   4. Ischemic cardiomyopathy   5. Hyperlipidemia, unspecified hyperlipidemia type     PLAN:  Mr. Jovahn Breit is a very pleasant 81 year-old gentleman who had developed progressive exertional shortness of breath leading to hypoxia requiring prompt hospital admission to [facility] in [place] in March 2022.  Initial BNP was 1255.  CTA of his chest was negative for PE but showed pulmonary edema with bilateral effusions.  Cardiac catheterization on August 18, 2020  showed severe multivessel CAD and ultimately underwent successful CABG revascularization surgery by Dr. [name] on August 23, 2020.  His postop course was complicated by atrial fibrillation with RVR for which he required amiodarone.  He also developed bilateral effusions and ultimately required subsequent thoracentesis in May 2022.  When I saw him for his initial evaluation with me on December 12, 2020 he was feeling significantly improved.  He has not had recurrent anginal symptoms.  He has been successfully started on Entresto 24/26 mg twice a day and has tolerated the addition of Farxiga 10 mg in addition to metoprolol succinate 12.5 mg daily.  A proBNP in November 2022 remained elevated at 1302.  In November creatinine was 1.24.  At his  office visit in January 2023 blood pressure remained low and for this reason I did not further titrate Entresto.  proBNP remains elevated but slightly improved at 1299.  At that time  I rechallenge him with low-dose spironolactone 12.5 for aldosterone blockade and recommended he take this every  third day.  Laboratory from his primary provider from September 01, 2021 showed a potassium of 4.5.  BUN 21 creatinine 1.18.  Over the last 6 months, he has remained stable.  His blood pressure today is excellent and actually now in the 932I systolically.  As result, I will now attempt to titrate Entresto to 49/51 mg twice a day and try to provide him with  some additional samples at this dosing.  I have recommended in several weeks she undergo a comprehensive metabolic panel and proBNP.  He continues to be on Farxiga 10 mg daily, metoprolol succinate 12.5 mg as well as spironolactone 12.5 mg every third day.  He continues to be on atorvastatin 40 mg for hyperlipidemia.  He had recent laboratory in September 2023 by his primary physician which I have reviewed.  Creatinine was 1.14.  Potassium 4.3.  LFTs were normal.  LDL cholesterol was excellent at 47 with total cholesterol 120 and triglycerides 88.  Hemoglobin A1c was slightly increased at 6.2.  In 3 months I am scheduling him for follow-up echo Doppler study for reassessment of LV function on his increased guideline directed medical regimen.  I will see him in follow-up and further recommendations will be made at that time.   Medication Adjustments/Labs and Tests Ordered: Current medicines are reviewed at length with the patient today.  Concerns regarding medicines are outlined above.  Medication changes, Labs and Tests ordered today are listed in the Patient Instructions below. Patient Instructions  Medication Instructions:  Your physician has recommended you make the following change in your medication:   -Increase Entresto to 49/32m twice daily.  *If you need a refill on your cardiac medications before your next appointment, please call your pharmacy*   Lab Work: Your physician recommends that you return for lab work in: 2 weeks CMET & BNP  If you have labs (blood work) drawn today and your tests are completely normal, you will receive your  results only by: [place](if you have MyChart) OR A paper copy in the mail If you have any lab test that is abnormal or we need to change your treatment, we will call you to review the results.   Testing/Procedures: Your physician has requested that you have an echocardiogram. Echocardiography is a painless test that uses sound waves to create images of your heart. It provides your doctor with information about the size and shape of your heart and how well your heart's chambers and valves are working. This procedure takes approximately one hour. There are no restrictions for this procedure. Please do NOT wear cologne, perfume, aftershave, or lotions (deodorant is allowed). Please arrive 15 minutes prior to your appointment time. To be done in March 2024. This procedure [address] done [id number]   Follow-Up: [facility] and your [facility] needs are our priority.  As part of our continuing mission to provide you with exceptional heart care, we have created designated Provider Care Teams.  These Care Teams include your primary Cardiologist (physician) and Advanced Practice Providers (APPs -  Physician Assistants and Nurse Practitioners) who all work together to provide you with the care you need, when you need it.  We recommend signing up for the patient portal called "MyChart".  Sign up information is provided on this After Visit Summary.  MyChart is used to connect with patients for Virtual Visits (Telemedicine).  Patients are able to view lab/test results, encounter notes, upcoming appointments, etc.  Non-urgent messages can be sent to your provider as well.   To learn more about what you can do with MyChart, go to [email or web address]    Your next appointment:   3 month(s)  The format for your next appointment:   In Person  Provider:   TShelva Majestic MD      Signed, TShelva Majestic MD  05/14/2022 3:24 PM  [facility] [address] [place], [place], [place]  [address] Phone: [phone]

## 2022-05-13 NOTE — Progress Notes (Unsigned)
Medication samples have been provided to the patient at office visit.  Drug name: Sherryll Burger 49/51mg   Qty: 1 bottle  LOT: [id number] Exp.Date: 01/2024

## 2022-05-13 NOTE — Patient Instructions (Signed)
Medication Instructions:  Your physician has recommended you make the following change in your medication:   -Increase Entresto to 49/51mg  twice daily.  *If you need a refill on your cardiac medications before your next appointment, please call your pharmacy*   Lab Work: Your physician recommends that you return for lab work in: 2 weeks CMET & BNP  If you have labs (blood work) drawn today and your tests are completely normal, you will receive your results only by: MyChart Message (if you have MyChart) OR A paper copy in the mail If you have any lab test that is abnormal or we need to change your treatment, we will call you to review the results.   Testing/Procedures: Your physician has requested that you have an echocardiogram. Echocardiography is a painless test that uses sound waves to create images of your heart. It provides your doctor with information about the size and shape of your heart and how well your heart's chambers and valves are working. This procedure takes approximately one hour. There are no restrictions for this procedure. Please do NOT wear cologne, perfume, aftershave, or lotions (deodorant is allowed). Please arrive 15 minutes prior to your appointment time. To be done in March 2024. This procedure will be done at [address] [place]   Follow-Up: At [facility], you and your health needs are our priority.  As part of our continuing mission to provide you with exceptional heart care, we have created designated Provider Care Teams.  These Care Teams include your primary Cardiologist (physician) and Advanced Practice Providers (APPs -  Physician Assistants and Nurse Practitioners) who all work together to provide you with the care you need, when you need it.  We recommend signing up for the patient portal called "MyChart".  Sign up information is provided on this After Visit Summary.  MyChart is used to connect with patients for Virtual Visits  (Telemedicine).  Patients are able to view lab/test results, encounter notes, upcoming appointments, etc.  Non-urgent messages can be sent to your provider as well.   To learn more about what you can do with MyChart, go to [email or web address].    Your next appointment:   3 month(s)  The format for your next appointment:   In Person  Provider:   Nicki Guadalajara, MD

## 2022-05-13 NOTE — Telephone Encounter (Signed)
Called [employer] regarding change in entresto prescription per Dr. Tresa Endo. Gave verbal order to increase entresto to 49/51mg . Per [employer] representative pt will be contacted early next week with shipping information.   Called pt's daughter (ok per DPR) and let her know this. Pt's daughter verbalizes understanding.

## 2022-05-14 ENCOUNTER — Encounter: Payer: Self-pay | Admitting: Cardiovascular Disease

## 2022-05-18 ENCOUNTER — Telehealth: Payer: Self-pay | Admitting: Cardiovascular Disease

## 2022-05-18 NOTE — Telephone Encounter (Signed)
Patient's daughter called and would like for Dr. Tresa Endo or nurse to give her a call regarding patient's medication entresto

## 2022-05-18 NOTE — Telephone Encounter (Signed)
Daughter stated that since Entresto dose was increased from 24-24 to 29-51, [employer] wants a new application. New application mailed to patient's home. Daughter will bring it in for Dr. Tresa Endo to sigh. Daughter wants to know if patient can double his 24-26 dose until tablets are finished. Please advise.

## 2022-05-19 NOTE — Telephone Encounter (Signed)
Spoke with daughter and informed her it is ok for patient to take two of his entresto 24-26 twice daily. She stated she will let patient know.

## 2022-05-19 NOTE — Telephone Encounter (Signed)
Okay to double up on the 24/26 mg tablets until new prescription arrives.

## 2022-05-27 ENCOUNTER — Other Ambulatory Visit: Payer: Self-pay | Admitting: Cardiovascular Disease

## 2022-05-27 NOTE — Telephone Encounter (Signed)
Received patient assistance forms for [employer], faxed over information today 12/13.

## 2022-05-30 LAB — COMPREHENSIVE METABOLIC PANEL
ALT: 15 IU/L (ref 0–44)
AST: 16 IU/L (ref 0–40)
Albumin/Globulin Ratio: 1.5 (ref 1.2–2.2)
Albumin: 4.2 g/dL (ref 3.7–4.7)
Alkaline Phosphatase: 65 IU/L (ref 44–121)
BUN/Creatinine Ratio: 15 (ref 10–24)
BUN: 16 mg/dL (ref 8–27)
Bilirubin Total: 0.7 mg/dL (ref 0.0–1.2)
CO2: 24 mmol/L (ref 20–29)
Calcium: 9.4 mg/dL (ref 8.6–10.2)
Chloride: 100 mmol/L (ref 96–106)
Creatinine, Ser: 1.08 mg/dL (ref 0.76–1.27)
Globulin, Total: 2.8 g/dL (ref 1.5–4.5)
Glucose: 93 mg/dL (ref 70–99)
Potassium: 4.7 mmol/L (ref 3.5–5.2)
Sodium: 139 mmol/L (ref 134–144)
Total Protein: 7 g/dL (ref 6.0–8.5)
eGFR: 69 mL/min/{1.73_m2} (ref >59)

## 2022-05-30 LAB — PRO B NATRIURETIC PEPTIDE: NT-Pro BNP: 651 pg/mL — ABNORMAL HIGH (ref 0–486)

## 2022-06-01 ENCOUNTER — Telehealth: Payer: Self-pay | Admitting: Cardiovascular Disease

## 2022-06-01 NOTE — Telephone Encounter (Signed)
Spoke with daughter to inform her that sample of entresto 49-51 twice daily was set aside for patient. Lot #" [id number]; Exp: 11/24.

## 2022-06-01 NOTE — Telephone Encounter (Signed)
Patient calling the office for samples of medication:   1.  What medication and dosage are you requesting samples for?   sacubitril-valsartan (ENTRESTO) 49-51 MG    2.  Are you currently out of this medication?  Yes   

## 2022-06-09 ENCOUNTER — Telehealth: Payer: Self-pay | Admitting: Cardiovascular Disease

## 2022-06-09 NOTE — Telephone Encounter (Signed)
Returned call to patients daughter who states that she was calling into check on her father application for patient assistance. Per patient he received a letter stating that they have not received patient portion of forms. Per patients daughter patient has re faxed the forms to the company. I advised patients daughter that per chart review nurse had faxed forms over previously on 12/13- advised her I will send message to Raynelle Fanning, LPN for follow up. Patients daughter verbalized understanding.

## 2022-06-09 NOTE — Telephone Encounter (Signed)
Patient's daughter is calling stating the assistance program for entresto sent them a letter stating they need pt's proof of income and insurance information. Paul [name] states this information was dropped off with the filled out forms to the office. She is requesting to speak with Paul [name] regarding this. Please advise.

## 2022-06-10 NOTE — Telephone Encounter (Signed)
Contacted patient daughter. I faxed over paperwork on 05/27/22, however they may not have received it all. I did refax all the information that was given to me that I had. I advised I had resent it to them today, and to let me know if she heard anything.   She advised she would keep me updated.   Daughter thankful for call back.

## 2022-07-01 ENCOUNTER — Telehealth: Payer: Self-pay | Admitting: Cardiovascular Disease

## 2022-07-01 NOTE — Telephone Encounter (Signed)
Called daughter, advised of forms needed.   They will have these filled out and given to me to fax over.   Patient daughter verbalized understanding

## 2022-07-01 NOTE — Telephone Encounter (Signed)
Caller stated the patient wants advice on re-applying for the program to get his Farxiga medication.

## 2022-07-09 ENCOUNTER — Telehealth: Payer: Self-pay

## 2022-07-09 NOTE — Telephone Encounter (Signed)
Received documents from [employer]. [employer] forms to be faxed for Farxiga 10 mg.  Forms were completed, signed, and faxed today 07/09/2022.

## 2022-07-17 NOTE — Telephone Encounter (Signed)
Received forms that patient [employer] that shipment was sent via mail on 07/13/2022 with tracking number [id number] sent to [address] [place] [address]  Called patient advised that medication was sent. Patient verbalized understanding

## 2022-08-14 ENCOUNTER — Ambulatory Visit (HOSPITAL_COMMUNITY): Payer: Medicare Other | Attending: Cardiovascular Disease

## 2022-08-14 DIAGNOSIS — I5020 Unspecified systolic (congestive) heart failure: Secondary | ICD-10-CM | POA: Diagnosis not present

## 2022-08-14 DIAGNOSIS — I2510 Atherosclerotic heart disease of native coronary artery without angina pectoris: Secondary | ICD-10-CM

## 2022-08-14 LAB — ECHOCARDIOGRAM COMPLETE
Area-P 1/2: 2.66 cm2
S' Lateral: 3.1 cm

## 2022-08-20 ENCOUNTER — Other Ambulatory Visit: Payer: Self-pay | Admitting: Cardiovascular Disease

## 2022-08-24 ENCOUNTER — Other Ambulatory Visit: Payer: Self-pay | Admitting: Cardiovascular Disease

## 2022-08-24 ENCOUNTER — Ambulatory Visit: Payer: Medicare Other | Admitting: Cardiovascular Disease

## 2022-09-09 LAB — LAB REPORT - SCANNED
A1c: 6.3
EGFR: 63

## 2022-10-19 ENCOUNTER — Ambulatory Visit: Payer: Medicare Other | Attending: Cardiovascular Disease | Admitting: Cardiovascular Disease

## 2022-10-19 ENCOUNTER — Encounter: Payer: Self-pay | Admitting: Cardiovascular Disease

## 2022-10-19 VITALS — BP 136/86 | HR 70 | Ht 71.0 in | Wt 169.4 lb

## 2022-10-19 DIAGNOSIS — I5020 Unspecified systolic (congestive) heart failure: Secondary | ICD-10-CM | POA: Diagnosis not present

## 2022-10-19 DIAGNOSIS — E785 Hyperlipidemia, unspecified: Secondary | ICD-10-CM | POA: Diagnosis not present

## 2022-10-19 DIAGNOSIS — Z951 Presence of aortocoronary bypass graft: Secondary | ICD-10-CM | POA: Insufficient documentation

## 2022-10-19 DIAGNOSIS — I255 Ischemic cardiomyopathy: Secondary | ICD-10-CM | POA: Insufficient documentation

## 2022-10-19 NOTE — Progress Notes (Unsigned)
Cardiology Office Note    Date:  10/19/2022   ID:  Paul [name], DOB Oct 24, 1940, MRN [id number]  PCP:  Alinda Deem, MD  Cardiologist:  Nicki Guadalajara, MD   7 month follow-up office visit   History of Present Illness:  Paul [name] is a 82 y.o. male who is from [place].  I saw him when he was admitted to [facility] on August 18, 2020.  At that time he had a 81-month history of episodic exertionally precipitated chest discomfort.  Initially he did not seek medical attention and more recently had seen his primary care provider and was hypoxic prompting hospitalization at [facility] in [place].  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  He was heparinized and transferred to [facility].  I recommended he undergo a cardiac MRI to assess for LV viability.  A 2D echo Doppler study showed reduced LV function with EF at 30 to 35%.  He was found to have grade 3 diastolic dysfunction and significant increased pulmonary artery systolic pressure at 70 mm with moderate tricuspid regurgitation.  He ultimately underwent CABG revascularization surgery x3 by Dr. Evelene Croon and had a LIMA graft placed to the OM1, SVG to OM 2, and SVG to the posterolateral vessel on August 23, 2021.  He returned to the OR the following day for expectation of being studied for bleeding.  Postoperative course was complicated by volume overload and PAF with RVR for which she was placed on amiodarone therapy.  He also required midodrine for hypotension.  He was subsequently seen for his initial hospital visit by Azalee Course, PA on September 11, 2020.  The patient has been seen by Dr. Laneta Simmers and had  developed a left pleural effusion and required left thoracentesis on Nov 05, 2020.  575 cc of dark red fluid was removed.  Follow-up chest x-ray did not reveal any pneumothorax.  There was a small residual effusion which remained.  It was recommended he continue Lasix 40 mg daily.  I saw him for my initial evaluation with me follow his hospitalization on December 12, 2020.  At that time he was here with his daughter who lives in [place] and is a Midwife at [employer].  He was scheduled to undergo a follow-up chest x-ray today but since he was running behind he did not get this done prior to his office visit.  He denied any recurrence of chest pain or shortness of breath and has been participating in cardiac rehab in [place].  At times he had experienced lower extremity edema around his ankles.  He was unaware of palpitations.  His breathing has improved.  His ECG showed sinus rhythm with QS complex consistent with his anteroseptal myocardial infarction.  With his leg edema I suggested he continue furosemide 20 mg twice a day.  I recommended he undergo a follow-up echo Doppler study to reassess LV systolic and diastolic function as well as pulmonary pressures.  He had stage III CKD with creatinine 1.45 and follow-up laboratory was recommended.  He underwent a follow-up chest x-ray following his office visit which showed improvement in his left pleural effusion compared to his prior November 15, 2019 evaluation.  On January 02, 2021 he underwent a follow-up echo Doppler study.  EF continues to be severely reduced at 25 to 30% with grade 3 diastolic dysfunction consistent with restrictive physiology.  He had elevated left atrial pressure.  There was mild mitral regurgitation and mild to moderate aortic sclerosis without stenosis.  Estimated RV systolic pressure was 02.1 mm.  He continues to participate in cardiac rehab.  He denies any chest pain.    I saw him for an office evaluation on January 20, 2021.  At that time, he was breathing better and since his edema was significantly improved I recommended he reduce Lasix from 20 mg twice a day to 20 mg daily.  At that time I initiated Entresto at 24/26 mg twice a day and provided him with samples.  Recommended he discontinue his potassium supplementation.  With his resting pulse of 76 I added low-dose metoprolol succinate at 12.5 mg.  I recommended he reduce his aspirin to 81 mg.  He continues to be on atorvastatin 40 mg for hyperlipidemia.  I recommended that he follow-up with our pharmacist in 3 to 4 weeks after initiation of Entresto.  As result, he was subsequently evaluated on February 03, 2021.  Evaluation due to low blood pressure readings his Delene Loll was not increased.  He was started on SGLT2 inhibition was started on Farxiga 10 mg.  I saw him on March 18, 2021.  He participated in cardiac rehab and derived significant benefit.  He states his blood pressures at home typically run around 115-520 systolically.  He denied recurrent chest pain or edema or shortness of breath.  During that evaluation, his blood pressure remained low and he did not have significant edema.  I recommended he discontinue furosemide in addition to potassium chloride.  I suggested the addition of low-dose spironolactone 12.5 mg.  Creatinine was 1.24.  Potassium was 5.1.  I saw him on June 20, 2021.  He underwent laboratory in April 24, 2021.  NT proBNP was 06/17/2000.  He has participated in cardiac rehab.  He is here with his daughter today.  He has a record log of his blood pressures and his blood pressures at home typically run in the 100-1 05 range.  He apparently had a urinary tract infection requiring antibiotic therapy in November.  He denies any chest pain.  Presently, he is on aspirin 81 mg, Entresto 24/26 twice a day, Farxiga 10 mg daily, metoprolol 12.5 mg daily.  He does not have any significant leg swelling.   When I last saw him on October 01, 2021 he  remained asymptomatic.  He specifically denied any shortness of breath or chest pain.  He was walking at least 30 minutes every day.  He continued to be on Entresto 24/26 mg twice a day, metoprolol succinate 12.5 mg daily, Farxiga 10 mg daily, and now takes spironolactone 12.5 mg every third day.  Remotely he had a history of elevated potassium.  Most recent laboratory done on September 01, 2021 by Drs. [name] showed a creatinine of 1.18.  BUN 21.  Potassium 4.5.  LFTs are normal.  Lipid studies reveal total cholesterol 124, triglycerides 111, HDL 45, and LDL 59.  Hemoglobin 15.7 hematocrit 46.0.   Since I last saw him, he has felt well.  He denies any dizziness or significant shortness of breath.  His blood pressure has become irregular and not low and typically may run around 1 80-2 23V systolically with pulse in the 70s.  Previously, he continued to  have elevated pro BNP blood pressure had been too low to further titrate Entresto.  He is here with his daughter.  He is unaware of any palpitations.  He uses support stockings daily.  At times there is trivial residual edema.  He continues to be on Entresto 24/26 mg twice a day, Farxiga 10 mg daily, metoprolol succinate 12.5 mg in addition to spironolactone 12.5 mg every third day.  He presents for evaluation.  Past Medical History:  Diagnosis Date   Glaucoma    High blood pressure    High cholesterol     Past Surgical History:  Procedure Laterality Date   CARDIAC CATHETERIZATION  08/19/2020   CHEST EXPLORATION N/A 08/23/2020   Procedure: RE- EXPLORATION POST CABG;  Surgeon: Alleen Borne, MD;  Location: [facility] OR;  Service: Thoracic;  Laterality: N/A;   CORONARY ARTERY BYPASS GRAFT N/A 08/23/2020   Procedure: CORONARY ARTERY BYPASS GRAFTING (CABG) X   THREE , LEFT INTERNAL MAMMARY HARVEST, RIGHT LEG ENDOSCOPIC SAPHENOUS VEIN HARVESTING, LEFT ENDOSCOPIC SAPHENOUS VEIN HARVEST;  Surgeon: Alleen Borne, MD;  Location: [facility] OR;  Service: Open Heart  Surgery;  Laterality: N/A;   IR THORACENTESIS ASP PLEURAL SPACE W/IMG GUIDE  11/05/2020   TEE WITHOUT CARDIOVERSION N/A 08/23/2020   Procedure: TRANSESOPHAGEAL ECHOCARDIOGRAM (TEE);  Surgeon: Alleen Borne, MD;  Location[facility] OR;  Service: Open Heart Surgery;  Laterality: N/A;   WRIST FRACTURE SURGERY Right    "years ago"    Current Medications: Outpatient Medications Prior to Visit  Medication Sig Dispense Refill   aspirin EC 81 MG tablet Take 1 tablet (81 mg total) by mouth daily. Swallow whole. 90 tablet 3   atorvastatin (LIPITOR) 40 MG tablet TAKE 1 TABLET BY MOUTH EVERY DAY 90 tablet 2   FARXIGA 10 MG TABS tablet TAKE 1 TABLET BY MOUTH DAILY BEFORE BREAKFAST. 30 tablet 3   finasteride (PROSCAR) 5 MG tablet Take 1 tablet (5 mg total) by mouth daily. 30 tablet 1   latanoprost (XALATAN) 0.005 % ophthalmic solution Place 1 drop into both eyes at bedtime.     metoprolol succinate (TOPROL-XL) 25 MG 24 hr tablet TAKE 1/2 TABLET BY MOUTH EVERY DAY 45 tablet 3   Multiple Vitamin (MULTIVITAMIN WITH MINERALS) TABS tablet Take 1 tablet by mouth daily.     sacubitril-valsartan (ENTRESTO) 49-51 MG Take 1 tablet by mouth 2 (two) times daily. 180 tablet 3   spironolactone (ALDACTONE) 25 MG tablet TAKE 0.5 TABLETS (12.5 MG TOTAL) BY MOUTH 2 (TWO) TIMES A WEEK. TAKE EVERY 3 DAYS 13 tablet 6   No facility-administered medications prior to visit.     Allergies:   Patient has no known allergies.   Social History   Socioeconomic History   Marital status: Widowed    Spouse name: Not on file   Number of children: Not on file   Years of education: Not on file   Highest education level: Not on file  Occupational History   Not on file  Tobacco Use   Smoking status: Never   Smokeless tobacco: Never  Substance and Sexual Activity   Alcohol use: Not Currently   Drug use: Never   Sexual activity: Not on file  Other Topics Concern   Not on file  Social History Narrative   Not on file   Social  Determinants of Health   Financial Resource Strain: Not on file  Food Insecurity: Not on file  Transportation Needs: Not on file  Physical Activity: Not on  file  Stress: Not on file  Social Connections: Not on file     Family History:  The patient's family history is not on file.  Parents are deceased.  ROS General: Negative; No fevers, chills, or night sweats;  HEENT: Negative; No changes in vision or hearing, sinus congestion, difficulty swallowing Pulmonary: Negative; No cough, wheezing, shortness of breath, hemoptysis Cardiovascular: See HPI GI: Negative; No nausea, vomiting, diarrhea, or abdominal pain GU: Negative; No dysuria, hematuria, or difficulty voiding Musculoskeletal: Negative; no myalgias, joint pain, or weakness Hematologic/Oncology: Negative; no easy bruising, bleeding Endocrine: Negative; no heat/cold intolerance; no diabetes Neuro: Negative; no changes in balance, headaches Skin: Negative; No rashes or skin lesions Psychiatric: Negative; No behavioral problems, depression Sleep: Negative; No snoring, daytime sleepiness, hypersomnolence, bruxism, restless legs, hypnogognic hallucinations, no cataplexy Other comprehensive 14 point system review is negative.   PHYSICAL EXAM:   VS:  BP 136/86   Pulse 70   Ht 5\' 11"  (1.803 m)   Wt 169 lb 6.4 oz (76.8 kg)   SpO2 97%   BMI 23.63 kg/m     Repeat blood pressure by me was 132/80 supine and 136/80 standing.  Wt Readings from Last 3 Encounters:  10/19/22 169 lb 6.4 oz (76.8 kg)  05/13/22 166 lb 9.6 oz (75.6 kg)  10/01/21 163 lb 3.2 oz (74 kg)    General: Alert, oriented, no distress.  Skin: normal turgor, no rashes, warm and dry HEENT: Normocephalic, atraumatic. Pupils equal round and reactive to light; sclera anicteric; extraocular muscles intact;  Nose without nasal septal hypertrophy Mouth/Parynx benign; Mallinpatti scale 2/3 Neck: No JVD, no carotid bruits; normal carotid upstroke Lungs: clear to  ausculatation and percussion; no wheezing or rales Chest wall: without tenderness to palpitation Heart: PMI not displaced, RRR, s1 s2 normal, 1/6 systolic murmur, no diastolic murmur, no rubs, gallops, thrills, or heaves Abdomen: soft, nontender; no hepatosplenomehaly, BS+; abdominal aorta nontender and not dilated by palpation. Back: no CVA tenderness Pulses 2+ Musculoskeletal: full range of motion, normal strength, no joint deformities Extremities: no clubbing cyanosis or edema, Homan's sign negative  Neurologic: grossly nonfocal; Cranial nerves grossly wnl Psychologic: Normal mood and affect   Studies/Labs Reviewed:   Oct 19, 2022 ECG (independently read by me): NSR at 70; nonspecific T wave abnormality  May 13, 2022 ECG (independently read by me): NSR at 66, QS V1-3  October 01, 2021 ECG (independently read by me):  NSR at 71; mild T wave abnormality V2-4  June 20, 2021 ECG (independently read by me): NSR at 73; T wave inversion V4-6  March 18, 2021 ECG (independently read by me):  NSR at 63, QS V1-3, T inversion V1-5, QTc 446 msec  January 20, 2021 ECG (independently read by me): Normal sinus rhythm at 69 bpm.  Evidence for prior anteroseptal MI with QS complex V1 through V3.  Mild T wave abnormalities.  Normal intervals.  No ectopy  Recent Labs:    Latest Ref Rng & Units 05/29/2022   11:48 AM 07/11/2021   10:59 AM 04/24/2021   11:55 AM  BMP  Glucose 70 - 99 mg/dL 93  91  73   BUN 8 - 27 mg/dL 16  16  22    Creatinine 0.76 - 1.27 mg/dL 4.78  2.95  6.21   BUN/Creat Ratio 10 - 24 15  13  18    Sodium 134 - 144 mmol/L 139  147  141   Potassium 3.5 - 5.2 mmol/L 4.7  4.7  5.1  Chloride 96 - 106 mmol/L 100  100  100   CO2 20 - 29 mmol/L 24  27  26    Calcium 8.6 - 10.2 mg/dL 9.4  9.8  9.5         Latest Ref Rng & Units 05/29/2022   11:48 AM 07/11/2021   10:59 AM 01/28/2021   10:32 AM  Hepatic Function  Total Protein 6.0 - 8.5 g/dL 7.0  7.3  6.9   Albumin 3.7 - 4.7  g/dL 4.2  4.2  4.0   AST 0 - 40 IU/L 16  16  15    ALT 0 - 44 IU/L 15  14  12    Alk Phosphatase 44 - 121 IU/L 65  73  87   Total Bilirubin 0.0 - 1.2 mg/dL 0.7  0.9  0.8        Latest Ref Rng & Units 07/11/2021   10:59 AM 01/03/2021    8:08 AM 09/11/2020    3:22 PM  CBC  WBC 3.4 - 10.8 x10E3/uL 7.6  7.1  6.9   Hemoglobin 13.0 - 17.7 g/dL 16.1  09.6  04.5   Hematocrit 37.5 - 51.0 % 42.8  40.5  31.8   Platelets 150 - 450 x10E3/uL 253  238  409    Lab Results  Component Value Date   MCV 97 07/11/2021   MCV 87 01/03/2021   MCV 96 09/11/2020   Lab Results  Component Value Date   TSH 3.790 01/03/2021   Lab Results  Component Value Date   HGBA1C 6.2 (H) 08/19/2020     BNP    Component Value Date/Time   BNP 1,109.1 (H) 08/19/2020 0358    ProBNP    Component Value Date/Time   PROBNP 651 (H) 05/29/2022 1148     Lipid Panel     Component Value Date/Time   CHOL 107 01/03/2021 0808   TRIG 98 01/03/2021 0808   HDL 42 01/03/2021 0808   CHOLHDL 2.5 01/03/2021 0808   CHOLHDL 2.6 08/19/2020 0358   VLDL 16 08/19/2020 0358   LDLCALC 46 01/03/2021 0808   LABVLDL 19 01/03/2021 0808     RADIOLOGY: No results found.   Additional studies/ records that were reviewed today include:    LHC: 08/18/2020 Hemodynamics: Aortic pressure 91/46, LV pressure 91, LVEDP LM: distal 50% stenosis LAD: proximal 75-80% stenosis. Mid LAD subtotal stenosis with faint filling, finally TIMI-2 filling of distal LAD. - Diagonal 1 25-30% stenosis, not a large vessel - Diagonal 2 ostial 8% stenosis LCX: mid vessel with 99% stenosis with ulceration - OM1: proximal 75% stenosis - OM2: small vessel RCA: dominant vessel. Mid RCA with 99% stenosis, jump bridging collaterals which were right -right collaterals. RCA collateralized LAD  Echo 08/19/2020  1. Left ventricular ejection fraction, by estimation, is 25 to 30%. The  left ventricle has severely decreased function.   2. The left ventricle  demonstrates regional wall motion abnormalities .  All mid-to-apical septal, mid-to-apical anterolateral, mid-to-apical  anterior, apical inferior and apex appear akinetic. The rest of the LV  walls are hypokinetic.   3. There is mild concentric left ventricular hypertrophy. Left  ventricular diastolic parameters are consistent with Grade II diastolic  dysfunction (pseudonormalization).   4. Swirling of contrast seen in the left ventricle consistent with low  flow state. No LV thrombus visualized.   5. Right ventricular systolic function is normal. The right ventricular  size is normal.   6. The mitral valve is abnormal. Trivial mitral  valve regurgitation. No  evidence of mitral stenosis. Moderate mitral annular calcification.   7. The aortic valve has an indeterminant number of cusps. There is  moderate calcification of the aortic valve. There is moderate thickening  of the aortic valve. Aortic valve regurgitation is not visualized. Mild  aortic valve stenosis. Gradients low due  to low output state.   8. Wall motion concerning for multivessel coronary artery disease.    ECHO: 01/02/2021 IMPRESSIONS   1. Akinesis of the distal anterior, anteroseptal, inferior and apical  walls; overall severe LV dysfunction.   2. Left ventricular ejection fraction, by estimation, is 25 to 30%. The  left ventricle has severely decreased function. The left ventricle  demonstrates regional wall motion abnormalities (see scoring  diagram/findings for description). Left ventricular  diastolic parameters are consistent with Grade III diastolic dysfunction  (restrictive). Elevated left atrial pressure.   3. Right ventricular systolic function is normal. The right ventricular  size is mildly enlarged. There is mildly elevated pulmonary artery  systolic pressure.   4. The mitral valve is normal in structure. Mild mitral valve  regurgitation. No evidence of mitral stenosis.   5. The aortic valve is  tricuspid. Aortic valve regurgitation is trivial.  Mild to moderate aortic valve sclerosis/calcification is present, without  any evidence of aortic stenosis.   6. The inferior vena cava is normal in size with greater than 50%  respiratory variability, suggesting right atrial pressure of 3 mmHg.   ASSESSMENT:    No diagnosis found.   PLAN:  Mr. Paul [name] is a very pleasant 82 year-old gentleman who had developed progressive exertional shortness of breath leading to hypoxia requiring prompt hospital admission to [facility] in [place] in March 2022.  Initial BNP was 1255.  CTA of his chest was negative for PE but showed pulmonary edema with bilateral effusions.  Cardiac catheterization on August 18, 2020  showed severe multivessel CAD and ultimately underwent successful CABG revascularization surgery by Dr. Laneta Simmers on August 23, 2020.  His postop course was complicated by atrial fibrillation with RVR for which he required amiodarone.  He also developed bilateral effusions and ultimately required subsequent thoracentesis in May 2022.  When I saw him for his initial evaluation with me on December 12, 2020 he was feeling significantly improved.  He has not had recurrent anginal symptoms.  He has been successfully started on Entresto 24/26 mg twice a day and has tolerated the addition of Farxiga 10 mg in addition to metoprolol succinate 12.5 mg daily.  A proBNP in November 2022 remained elevated at 1302.  In November creatinine was 1.24.  At his  office visit in January 2023 blood pressure remained low and for this reason I did not further titrate Entresto.  proBNP remains elevated but slightly improved at 1299.  At that time  I rechallenge him with low-dose spironolactone 12.5 for aldosterone blockade and recommended he take this every third day.  Laboratory from his primary provider from September 01, 2021 showed a potassium of 4.5.  BUN 21 creatinine 1.18.  Over the last 6 months, he has remained stable.   His blood pressure today is excellent and actually now in the 130s systolically.  As result, I will now attempt to titrate Entresto to 49/51 mg twice a day and try to provide him with some additional samples at this dosing.  I have recommended in several weeks she undergo a comprehensive metabolic panel and proBNP.  He continues to be on Farxiga 10  mg daily, metoprolol succinate 12.5 mg as well as spironolactone 12.5 mg every third day.  He continues to be on atorvastatin 40 mg for hyperlipidemia.  He had recent laboratory in September 2023 by his primary physician which I have reviewed.  Creatinine was 1.14.  Potassium 4.3.  LFTs were normal.  LDL cholesterol was excellent at 47 with total cholesterol 120 and triglycerides 88.  Hemoglobin A1c was slightly increased at 6.2.  In 3 months I am scheduling him for follow-up echo Doppler study for reassessment of LV function on his increased guideline directed medical regimen.  I will see him in follow-up and further recommendations will be made at that time.   Medication Adjustments/Labs and Tests Ordered: Current medicines are reviewed at length with the patient today.  Concerns regarding medicines are outlined above.  Medication changes, Labs and Tests ordered today are listed in the Patient Instructions below. There are no Patient Instructions on file for this visit.   Signed, Nicki Guadalajara, MD  10/19/2022 9:15 AM    [facility] [address], [place], [place]  [address] Phone: [phone]

## 2022-10-19 NOTE — Patient Instructions (Signed)
Medication Instructions:  NO CHANGES  *If you need a refill on your cardiac medications before your next appointment, please call your pharmacy*    Follow-Up: At [facility], you and your health needs are our priority.  As part of our continuing mission to provide you with exceptional heart care, we have created designated Provider Care Teams.  These Care Teams include your primary Cardiologist (physician) and Advanced Practice Providers (APPs -  Physician Assistants and Nurse Practitioners) who all work together to provide you with the care you need, when you need it.  We recommend signing up for the patient portal called "MyChart".  Sign up information is provided on this After Visit Summary.  MyChart is used to connect with patients for Virtual Visits (Telemedicine).  Patients are able to view lab/test results, encounter notes, upcoming appointments, etc.  Non-urgent messages can be sent to your provider as well.   To learn more about what you can do with MyChart, go to [email or web address].    Your next appointment:    December 2024 with Dr. Tresa Endo

## 2022-10-21 ENCOUNTER — Encounter: Payer: Self-pay | Admitting: Cardiovascular Disease

## 2022-12-23 ENCOUNTER — Other Ambulatory Visit: Payer: Self-pay | Admitting: Cardiovascular Disease

## 2023-04-26 ENCOUNTER — Telehealth: Payer: Self-pay | Admitting: Cardiovascular Disease

## 2023-04-26 NOTE — Telephone Encounter (Signed)
Patient's daughter dropped off patient assistance forms to be filled out  In providers box

## 2023-04-29 NOTE — Telephone Encounter (Signed)
Forms have been faxed to [employer] at [phone]

## 2023-05-06 NOTE — Telephone Encounter (Signed)
Daughter called to say that the patient assistance forms states they are missing the info. They are missing proof of income, house of income and  patient signature. Daughter states that she drop all of that off at our office. Please advise

## 2023-05-15 ENCOUNTER — Other Ambulatory Visit: Payer: Self-pay | Admitting: Cardiovascular Disease

## 2023-05-25 ENCOUNTER — Other Ambulatory Visit: Payer: Self-pay | Admitting: Cardiovascular Disease

## 2023-05-25 ENCOUNTER — Encounter: Payer: Self-pay | Admitting: Cardiovascular Disease

## 2023-05-25 ENCOUNTER — Ambulatory Visit: Payer: Medicare Other | Attending: Cardiovascular Disease | Admitting: Cardiovascular Disease

## 2023-05-25 VITALS — BP 110/78 | HR 68 | Ht 71.0 in | Wt 166.2 lb

## 2023-05-25 DIAGNOSIS — I25110 Atherosclerotic heart disease of native coronary artery with unstable angina pectoris: Secondary | ICD-10-CM

## 2023-05-25 DIAGNOSIS — I2510 Atherosclerotic heart disease of native coronary artery without angina pectoris: Secondary | ICD-10-CM | POA: Insufficient documentation

## 2023-05-25 DIAGNOSIS — E785 Hyperlipidemia, unspecified: Secondary | ICD-10-CM | POA: Insufficient documentation

## 2023-05-25 DIAGNOSIS — I255 Ischemic cardiomyopathy: Secondary | ICD-10-CM | POA: Insufficient documentation

## 2023-05-25 DIAGNOSIS — I5020 Unspecified systolic (congestive) heart failure: Secondary | ICD-10-CM

## 2023-05-25 DIAGNOSIS — Z951 Presence of aortocoronary bypass graft: Secondary | ICD-10-CM | POA: Insufficient documentation

## 2023-05-25 MED ORDER — DAPAGLIFLOZIN PROPANEDIOL 10 MG PO TABS: 10.0000 mg | ORAL_TABLET | Freq: Every day | ORAL | 3 refills | Status: DC

## 2023-05-25 MED ORDER — SACUBITRIL-VALSARTAN 49-51 MG PO TABS: 1.0000 | ORAL_TABLET | Freq: Two times a day (BID) | ORAL | Status: DC

## 2023-05-25 NOTE — Progress Notes (Unsigned)
Cardiology  Office Note    Date:  05/26/2023   ID:  Paul [name], DOB [birth year], MRN [id number]  PCP:  Alinda Deem, MD  Cardiologist:  Nicki Guadalajara, MD   7 month follow-up office visit   History of Present Illness:  Paul [name] is a 82 y.o. male who is from [place].  I saw him when he was admitted to [facility] on August 18, 2020.  At that time he had a 31-month history of episodic exertionally precipitated chest discomfort.  Initially he did not seek medical attention and more recently had seen his primary care provider and was hypoxic prompting hospitalization at [facility] in [place].  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  He was heparinized and transferred to [facility].  I recommended he undergo a cardiac MRI to assess for LV viability.  A 2D echo Doppler study showed reduced LV function with EF at 30 to 35%.  He was found to have grade 3 diastolic dysfunction and significant increased pulmonary artery systolic pressure at 70 mm with moderate tricuspid regurgitation.  He ultimately underwent CABG revascularization surgery x3 by Dr. Evelene Croon and had a LIMA graft placed to the OM1, SVG to OM 2, and SVG to the posterolateral vessel on August 23, 2021.  He returned to the OR the following day for expectation of being studied for bleeding.  Postoperative course was complicated by volume overload and PAF with RVR for which she was placed on amiodarone therapy.  He also required midodrine for hypotension.  He was subsequently seen for his initial hospital visit by Azalee Course, PA on September 11, 2020.  The patient has been seen by Dr. Laneta Simmers and  had developed a left pleural effusion and required left thoracentesis on Nov 05, 2020.  575 cc of dark red fluid was removed.  Follow-up chest x-ray did not reveal any pneumothorax.  There was a small residual effusion which remained.  It was recommended he continue Lasix 40 mg daily.  I saw him for my initial evaluation with me follow his hospitalization on December 12, 2020.  At that time he was here with his daughter who lives in [place] and is a Midwife at [employer].  He was scheduled to undergo a follow-up chest x-ray today but since he was running behind he did not get this done prior to his office visit.  He denied any recurrence of chest pain or shortness of breath and has been participating in cardiac rehab in [place].  At times he had experienced lower extremity edema around his ankles.  He was unaware of palpitations.  His breathing has improved.  His ECG showed sinus rhythm with QS complex consistent with his anteroseptal myocardial infarction.  With his leg edema I suggested he continue furosemide 20 mg twice a day.  I recommended he undergo a follow-up echo Doppler study to reassess LV systolic and diastolic function as well as pulmonary pressures.  He had stage III CKD with creatinine 1.45 and follow-up laboratory was recommended.  He underwent a follow-up chest x-ray following his office visit which showed improvement in his left pleural effusion compared to his prior November 15, 2019 evaluation.  On January 02, 2021 he underwent a follow-up echo Doppler study.  EF continues to be severely reduced at 25 to 30% with grade 3 diastolic dysfunction consistent with restrictive physiology.  He had elevated left atrial pressure.  There was mild mitral regurgitation and mild to moderate aortic sclerosis without stenosis.  Estimated RV systolic pressure was 42.4 mm.  He continues to participate in cardiac rehab.  He denies any chest pain.    I saw him for an office evaluation on January 20, 2021.  At that time, he was breathing better and since his edema was significantly improved I recommended he reduce Lasix from 20 mg twice a day to 20 mg daily.  At that time I initiated Entresto at 24/26 mg twice a day and provided him with samples.  Recommended he discontinue his potassium supplementation.  With his resting pulse of 76 I added low-dose metoprolol succinate at 12.5 mg.  I recommended he reduce his aspirin to 81 mg.  He continues to be on atorvastatin 40 mg for hyperlipidemia.  I recommended that he follow-up with our pharmacist in 3 to 4 weeks after initiation of Entresto.  As result, he was subsequently evaluated on February 03, 2021.  Evaluation due to low blood pressure readings his Sherryll Burger was not increased.  He was started on SGLT2 inhibition was started on Farxiga 10 mg.  I saw him on March 18, 2021.  He participated in cardiac rehab and derived significant benefit.  He states his blood pressures at home typically run around 100-108 systolically.  He denied recurrent chest pain or edema or shortness of breath.  During that evaluation, his blood pressure remained low and he did not have significant edema.  I recommended he discontinue furosemide in addition to potassium chloride.  I suggested the addition of low-dose spironolactone 12.5 mg.  Creatinine was 1.24.  Potassium was 5.1.  I saw him on June 20, 2021.  He underwent laboratory in April 24, 2021.  NT proBNP was 06/17/2000.  He has participated in cardiac rehab.  He is here with his daughter today.  He has a record log of his blood pressures and his blood pressures at home typically run in the 100-1 05 range.  He apparently had a urinary tract infection requiring antibiotic therapy in November.  He denies any chest pain.  Presently, he is on aspirin 81 mg, Entresto 24/26 twice a day, Farxiga 10 mg daily, metoprolol 12.5 mg daily.  He does not have any significant leg swelling.   When I saw him on October 01, 2021 he remained  asymptomatic.  He specifically denied any shortness of breath or chest pain.  He was walking at least 30 minutes every day.  He continued to be on Entresto 24/26 mg twice a day, metoprolol succinate 12.5 mg daily, Farxiga 10 mg daily, and now takes spironolactone 12.5 mg every third day.  Remotely he had a history of elevated potassium.  Most recent laboratory done on September 01, 2021 by Drs. Alinda Deem showed a creatinine of 1.18.  BUN 21.  Potassium 4.5.  LFTs are normal.  Lipid studies reveal total cholesterol 124, triglycerides 111, HDL 45, and LDL 59.  Hemoglobin 15.7 hematocrit 46.0.   I saw him on May 13, 2022.  At that time he continued to feel well.  He denies any dizziness or significant shortness of breath.  His blood pressure has become controlled and not low and typically may run around 110-130s systolically with pulse in the 70s.  Previously, he continued to have elevated pro BNP and blood pressure had been too low to further titrate Entresto.  He is here with his daughter.  He is unaware of any palpitations.  He uses support stockings daily.  At times there is trivial residual edema.  He continues to be on Entresto 24/26 mg twice a day, Farxiga 10 mg daily, metoprolol succinate 12.5 mg in addition to spironolactone 12.5 mg every third day.  During that evaluation, with blood pressure stability I attempted to titrate Entresto to 49/51 mg twice a day.  I recommended in several weeks to have follow-up laboratory and proBNP.  I scheduled him for follow-up echo Doppler study.  I saw him on Oct 19, 2022.  He has continued to do exceptionally well.  He denies shortness of breath or significant leg swelling.  He is walking daily.  Blood pressures typically run in the 120 range.  His heart rate typically is in the mid 60s to 70s.  He underwent an echo Doppler study on August 14, 2022 which showed normal LV function now at 55%.  There was moderate LVH, normal RV systolic pressure.  There was mitral  annular calcification and aortic valve sclerosis without stenosis.  He is here with his daughter for follow-up evaluation.  Since I last saw him, Paul [name] continues to feel stable.  He is now seeing Alinda Deem, MD in [place].  He remains fairly active.  He continues to be on Crestor 49/51 mg twice a day, spironolactone 12.5 mg every 3 days, metoprolol succinate 12.5 mg, and Farxiga 10 mg daily.  He is on atorvastatin 40 mg for hyperlipidemia and takes baby aspirin.  He denies chest pain PND orthopnea, palpitations, presyncope or syncope.  He presents for evaluation.  He is here with his daughter.  Past Medical History:  Diagnosis Date   Glaucoma    High blood pressure    High cholesterol     Past Surgical History:  Procedure Laterality Date   CARDIAC CATHETERIZATION  08/19/2020   CHEST EXPLORATION N/A 08/23/2020   Procedure: RE- EXPLORATION POST CABG;  Surgeon: Alleen Borne, MD;  Location: [facility] OR;  Service: Thoracic;  Laterality: N/A;   CORONARY ARTERY BYPASS GRAFT N/A 08/23/2020   Procedure: CORONARY ARTERY BYPASS GRAFTING (CABG) X   THREE , LEFT INTERNAL MAMMARY HARVEST, RIGHT LEG ENDOSCOPIC SAPHENOUS VEIN HARVESTING, LEFT ENDOSCOPIC SAPHENOUS VEIN HARVEST;  Surgeon: Alleen Borne, MD;  Location: [facility] OR;  Service: Open Heart Surgery;  Laterality: N/A;   IR THORACENTESIS ASP PLEURAL SPACE W/IMG GUIDE  11/05/2020   TEE WITHOUT CARDIOVERSION N/A 08/23/2020   Procedure: TRANSESOPHAGEAL ECHOCARDIOGRAM (TEE);  Surgeon: Alleen Borne, MD;  Location: [facility] OR;  Service: Open Heart Surgery;  Laterality: N/A;   WRIST FRACTURE SURGERY Right    "years ago"    Current Medications: Outpatient Medications Prior to Visit  Medication Sig Dispense Refill   aspirin EC 81 MG tablet Take 1 tablet (81 mg total) by mouth daily. Swallow whole. 90 tablet 3   atorvastatin (LIPITOR) 40 MG tablet TAKE 1 TABLET BY MOUTH EVERY DAY 90 tablet 2   finasteride (PROSCAR) 5 MG tablet Take 1 tablet (5 mg total) by  mouth daily. 30 tablet 1   latanoprost (XALATAN) 0.005 % ophthalmic solution Place 1 drop into both eyes at bedtime.     metoprolol succinate (TOPROL-XL) 25 MG 24 hr tablet TAKE 1/2 TABLET BY MOUTH DAILY 45 tablet 3   Multiple Vitamin (MULTIVITAMIN WITH MINERALS)  TABS tablet Take 1 tablet by mouth daily.     spironolactone (ALDACTONE) 25 MG tablet TAKE 0.5 TABLETS (12.5 MG TOTAL) BY MOUTH 2 (TWO) TIMES A WEEK. TAKE EVERY 3 DAYS 13 tablet 6   FARXIGA 10 MG TABS tablet TAKE 1 TABLET BY MOUTH DAILY BEFORE BREAKFAST. 30 tablet 3   sacubitril-valsartan (ENTRESTO) 49-51 MG Take 1 tablet by mouth 2 (two) times daily. 180 tablet 3   No facility-administered medications prior to visit.     Allergies:   Patient has no known allergies.   Social History   Socioeconomic History   Marital status: Widowed    Spouse name: Not on file   Number of children: Not on file   Years of education: Not on file   Highest education level: Not on file  Occupational History   Not on file  Tobacco Use   Smoking status: Never   Smokeless tobacco: Never  Substance and Sexual Activity   Alcohol use: Not Currently   Drug use: Never   Sexual activity: Not on file  Other Topics Concern   Not on file  Social History Narrative   Not on file   Social Determinants of Health   Financial Resource Strain: Not on file  Food Insecurity: Not on file  Transportation Needs: Not on file  Physical Activity: Not on file  Stress: Not on file  Social Connections: Not on file     Family History:  The patient's family history is not on file.  Parents are deceased.  ROS General: Negative; No fevers, chills, or night sweats;  HEENT: Negative; No changes in vision or hearing, sinus congestion, difficulty swallowing Pulmonary: Negative; No cough, wheezing, shortness of breath, hemoptysis Cardiovascular: See HPI GI: Negative; No nausea, vomiting, diarrhea, or abdominal pain GU: Negative; No dysuria, hematuria, or difficulty  voiding Musculoskeletal: Negative; no myalgias, joint pain, or weakness Hematologic/Oncology: Negative; no easy bruising, bleeding Endocrine: Negative; no heat/cold intolerance; no diabetes Neuro: Negative; no changes in balance, headaches Skin: Negative; No rashes or skin lesions Psychiatric: Negative; No behavioral problems, depression Sleep: Negative; No snoring, daytime sleepiness, hypersomnolence, bruxism, restless legs, hypnogognic hallucinations, no cataplexy Other comprehensive 14 point system review is negative.   PHYSICAL EXAM:   VS:  BP 110/78   Pulse 68   Ht 5\' 11"  (1.803 m)   Wt 166 lb 3.2 oz (75.4 kg)   SpO2 95%   BMI 23.18 kg/m     Repeat blood pressure by me was 136/76  Wt Readings from Last 3 Encounters:  05/25/23 166 lb 3.2 oz (75.4 kg)  10/19/22 169 lb 6.4 oz (76.8 kg)  05/13/22 166 lb 9.6 oz (75.6 kg)    General: Alert, oriented, no distress.  Skin: normal turgor, no rashes, warm and dry HEENT: Normocephalic, atraumatic. Pupils equal round and reactive to light; sclera anicteric; extraocular muscles intact;  Nose without nasal septal hypertrophy Mouth/Parynx benign; Mallinpatti scale 2/3 Neck: No JVD, no carotid bruits; normal carotid upstroke Lungs: clear to ausculatation and percussion; no wheezing or rales Chest wall: without tenderness to palpitation Heart: PMI not displaced, RRR, s1 s2 normal, 1/6 systolic murmur, no diastolic murmur, no rubs, gallops, thrills, or heaves Abdomen: soft, nontender; no hepatosplenomehaly, BS+; abdominal aorta nontender and not dilated by palpation. Back: no CVA tenderness Pulses 2+ Musculoskeletal: full range of motion, normal strength, no joint deformities Extremities: no clubbing cyanosis or edema, Homan's sign negative  Neurologic: grossly nonfocal; Cranial nerves grossly wnl Psychologic: Normal mood and affect  Studies/Labs Reviewed:   EKG Interpretation Date/Time:  Tuesday May 25 2023 09:56:56  EST Ventricular Rate:  68 PR Interval:  202 QRS Duration:  94 QT Interval:  398 QTC Calculation: 423 R Axis:   3  Text Interpretation: Normal sinus rhythm Nonspecific T wave abnormality When compared with ECG of 01-Sep-2020 21:46, Criteria for Anterior infarct are no longer Present Non-specific change in ST segment in Anterior leads Nonspecific T wave abnormality now evident in Anterior leads Confirmed by Nicki Guadalajara ([id number]) on 05/25/2023 10:45:32 AM    Oct 19, 2022 ECG (independently read by me): NSR at 70; nonspecific T wave abnormality  May 13, 2022 ECG (independently read by me): NSR at 66, QS V1-3  October 01, 2021 ECG (independently read by me):  NSR at 71; mild T wave abnormality V2-4  June 20, 2021 ECG (independently read by me): NSR at 73; T wave inversion V4-6  March 18, 2021 ECG (independently read by me):  NSR at 63, QS V1-3, T inversion V1-5, QTc 446 msec  January 20, 2021 ECG (independently read by me): Normal sinus rhythm at 69 bpm.  Evidence for prior anteroseptal MI with QS complex V1 through V3.  Mild T wave abnormalities.  Normal intervals.  No ectopy  Recent Labs:    Latest Ref Rng & Units 05/29/2022   11:48 AM 07/11/2021   10:59 AM 04/24/2021   11:55 AM  BMP  Glucose 70 - 99 mg/dL 93  91  73   BUN 8 - 27 mg/dL 16  16  22    Creatinine 0.76 - 1.27 mg/dL 1.91  4.78  2.95   BUN/Creat Ratio 10 - 24 15  13  18    Sodium 134 - 144 mmol/L 139  147  141   Potassium 3.5 - 5.2 mmol/L 4.7  4.7  5.1   Chloride 96 - 106 mmol/L 100  100  100   CO2 20 - 29 mmol/L 24  27  26    Calcium 8.6 - 10.2 mg/dL 9.4  9.8  9.5         Latest Ref Rng & Units 05/29/2022   11:48 AM 07/11/2021   10:59 AM 01/28/2021   10:32 AM  Hepatic Function  Total Protein 6.0 - 8.5 g/dL 7.0  7.3  6.9   Albumin 3.7 - 4.7 g/dL 4.2  4.2  4.0   AST 0 - 40 IU/L 16  16  15    ALT 0 - 44 IU/L 15  14  12    Alk Phosphatase 44 - 121 IU/L 65  73  87   Total Bilirubin 0.0 - 1.2 mg/dL 0.7  0.9  0.8         Latest Ref Rng & Units 07/11/2021   10:59 AM 01/03/2021    8:08 AM 09/11/2020    3:22 PM  CBC  WBC 3.4 - 10.8 x10E3/uL 7.6  7.1  6.9   Hemoglobin 13.0 - 17.7 g/dL 62.1  30.8  65.7   Hematocrit 37.5 - 51.0 % 42.8  40.5  31.8   Platelets 150 - 450 x10E3/uL 253  238  409    Lab Results  Component Value Date   MCV 97 07/11/2021   MCV 87 01/03/2021   MCV 96 09/11/2020   Lab Results  Component Value Date   TSH 3.790 01/03/2021   Lab Results  Component Value Date   HGBA1C 6.2 (H) 08/19/2020     BNP    Component Value Date/Time   BNP 1,109.1 (  H) 08/19/2020 0358    ProBNP    Component Value Date/Time   PROBNP 651 (H) 05/29/2022 1148     Lipid Panel     Component Value Date/Time   CHOL 107 01/03/2021 0808   TRIG 98 01/03/2021 0808   HDL 42 01/03/2021 0808   CHOLHDL 2.5 01/03/2021 0808   CHOLHDL 2.6 08/19/2020 0358   VLDL 16 08/19/2020 0358   LDLCALC 46 01/03/2021 0808   LABVLDL 19 01/03/2021 0808     RADIOLOGY: No results found.   Additional studies/ records that were reviewed today include:    LHC: 08/18/2020 Hemodynamics: Aortic pressure 91/46, LV pressure 91, LVEDP LM: distal 50% stenosis LAD: proximal 75-80% stenosis. Mid LAD subtotal stenosis with faint filling, finally TIMI-2 filling of distal LAD. - Diagonal 1 25-30% stenosis, not a large vessel - Diagonal 2 ostial 8% stenosis LCX: mid vessel with 99% stenosis with ulceration - OM1: proximal 75% stenosis - OM2: small vessel RCA: dominant vessel. Mid RCA with 99% stenosis, jump bridging collaterals which were right -right collaterals. RCA collateralized LAD  Echo 08/19/2020  1. Left ventricular ejection fraction, by estimation, is 25 to 30%. The  left ventricle has severely decreased function.   2. The left ventricle demonstrates regional wall motion abnormalities .  All mid-to-apical septal, mid-to-apical anterolateral, mid-to-apical  anterior, apical inferior and apex appear akinetic.  The rest of the LV  walls are hypokinetic.   3. There is mild concentric left ventricular hypertrophy. Left  ventricular diastolic parameters are consistent with Grade II diastolic  dysfunction (pseudonormalization).   4. Swirling of contrast seen in the left ventricle consistent with low  flow state. No LV thrombus visualized.   5. Right ventricular systolic function is normal. The right ventricular  size is normal.   6. The mitral valve is abnormal. Trivial mitral valve regurgitation. No  evidence of mitral stenosis. Moderate mitral annular calcification.   7. The aortic valve has an indeterminant number of cusps. There is  moderate calcification of the aortic valve. There is moderate thickening  of the aortic valve. Aortic valve regurgitation is not visualized. Mild  aortic valve stenosis. Gradients low due  to low output state.   8. Wall motion concerning for multivessel coronary artery disease.    ECHO: 01/02/2021 IMPRESSIONS   1. Akinesis of the distal anterior, anteroseptal, inferior and apical  walls; overall severe LV dysfunction.   2. Left ventricular ejection fraction, by estimation, is 25 to 30%. The  left ventricle has severely decreased function. The left ventricle  demonstrates regional wall motion abnormalities (see scoring  diagram/findings for description). Left ventricular  diastolic parameters are consistent with Grade III diastolic dysfunction  (restrictive). Elevated left atrial pressure.   3. Right ventricular systolic function is normal. The right ventricular  size is mildly enlarged. There is mildly elevated pulmonary artery  systolic pressure.   4. The mitral valve is normal in structure. Mild mitral valve  regurgitation. No evidence of mitral stenosis.   5. The aortic valve is tricuspid. Aortic valve regurgitation is trivial.  Mild to moderate aortic valve sclerosis/calcification is present, without  any evidence of aortic stenosis.   6. The inferior  vena cava is normal in size with greater than 50%  respiratory variability, suggesting right atrial pressure of 3 mmHg.    ECHO: 08/14/2022  1. Left ventricular ejection fraction, by estimation, is 50 to 55%. The  left ventricle has low normal function. The left ventricle has no regional  wall  motion abnormalities. There is moderate left ventricular hypertrophy.  Left ventricular diastolic  parameters are indeterminate.   2. Right ventricular systolic function is normal. The right ventricular  size is normal. There is normal pulmonary artery systolic pressure. The  estimated right ventricular systolic pressure is 28.0 mmHg.   3. The mitral valve is normal in structure. Trivial mitral valve  regurgitation. No evidence of mitral stenosis. Moderate mitral annular  calcification.   4. The aortic valve is tricuspid. There is moderate calcification of the  aortic valve. There is moderate thickening of the aortic valve. Aortic  valve regurgitation is not visualized. Aortic valve sclerosis is present,  with no evidence of aortic valve  stenosis.   5. The inferior vena cava is normal in size with greater than 50%  respiratory variability, suggesting right atrial pressure of 3 mmHg.   Comparison(s): Prior images reviewed side by side. The left ventricular  function has improved.   ASSESSMENT:    1. Coronary artery disease involving native coronary artery of native heart without angina pectoris   2. S/P CABG x 3: Dr. Laneta Simmers on August 23, 2020   3. Ischemic cardiomyopathy: EF subsequently normalized   4. Hyperlipidemia with target LDL less than 70     PLAN:  Paul [name] is a very pleasant 82 year-old gentleman who had developed progressive exertional shortness of breath leading to hypoxia requiring prompt hospital admission to [facility] in [place] in March 2022.  Initial BNP was 1255.  CTA of his chest was negative for PE but showed pulmonary edema with bilateral effusions.   Cardiac catheterization on August 18, 2020  showed severe multivessel CAD and ultimately he underwent successful CABG revascularization surgery by Dr. Laneta Simmers on August 23, 2020.  His postop course was complicated by atrial fibrillation with RVR for which he required amiodarone.  He also developed bilateral effusions and ultimately required subsequent thoracentesis in May 2022.  When I saw him for his initial evaluation with me on December 12, 2020 he was feeling significantly improved.  He has not had recurrent anginal symptoms.  He has been successfully started on Entresto 24/26 mg twice a day and has tolerated the addition of Farxiga 10 mg in addition to metoprolol succinate 12.5 mg daily.  A proBNP in November 2022 remained elevated at 1302.  In November creatinine was 1.24.  At his  office visit in January 2023 blood pressure remained low and for this reason I did not further titrate Entresto.  proBNP remains elevated but slightly improved at 1299.  At that time  I rechallenge him with low-dose spironolactone 12.5 for aldosterone blockade and recommended he take this every third day.  Laboratory from his primary provider from September 01, 2021 showed a potassium of 4.5.  BUN 21 creatinine 1.18.  He he has stabilized and subsequently Entresto was titrated to 49/51 mg twice a day.  His most recent echo Doppler study in March 2024 showed normalization of LV function with EF at 50 to 55%.  He had normal pulmonary artery systolic pressure.  There was moderate mitral annular calcification and aortic sclerosis.  Presently, he continues to be asymptomatic.  He walks daily.  His ECG today is stable.  Blood pressure is stable and initially was 110/78 but on repeat by me was 136/76.  His primary MD in [place] will be checking laboratory.  Gwyndolyn Kaufman from March 2024 showed stable renal function with creatinine 1.16, normal transaminases, stable hemoglobin/hematocrit at 16/47.5.  Lipid studies were  excellent with total cholesterol 132,  triglycerides 113, HDL 53 and LDL 59.  He will continue with his current regimen.  I will see him in 6 months for reevaluation or sooner as needed.    Medication Adjustments/Labs and Tests Ordered: Current medicines are reviewed at length with the patient today.  Concerns regarding medicines are outlined above.  Medication changes, Labs and Tests ordered today are listed in the Patient Instructions below. Patient Instructions  Medication Instructions:  *If you need a refill on your cardiac medications before your next appointment, please call your pharmacy*   Lab Work: If you have labs (blood work) drawn today and your tests are completely normal, you will receive your results only by: MyChart Message (if you have MyChart) OR A paper copy in the mail If you have any lab test that is abnormal or we need to change your treatment, we will call you to review the results.  Follow-Up: At [facility], you and your health needs are our priority.  As part of our continuing mission to provide you with exceptional heart care, we have created designated Provider Care Teams.  These Care Teams include your primary Cardiologist (physician) and Advanced Practice Providers (APPs -  Physician Assistants and Nurse Practitioners) who all work together to provide you with the care you need, when you need it.  We recommend signing up for the patient portal called "MyChart".  Sign up information is provided on this After Visit Summary.  MyChart is used to connect with patients for Virtual Visits (Telemedicine).  Patients are able to view lab/test results, encounter notes, upcoming appointments, etc.  Non-urgent messages can be sent to your provider as well.   To learn more about what you can do with MyChart, go to [email or web address].    Your next appointment:   6 month(s)  Provider:   Nicki Guadalajara, MD     Other Instructions A letter will be mailed to you as a reminder to call the office for  your follow up appointment.    Signed, Nicki Guadalajara, MD  05/26/2023 8:43 AM    [facility] [address], [place], [place]  [address] Phone: [phone]

## 2023-05-25 NOTE — Patient Instructions (Signed)
Medication Instructions:  *If you need a refill on your cardiac medications before your next appointment, please call your pharmacy*   Lab Work: If you have labs (blood work) drawn today and your tests are completely normal, you will receive your results only by: MyChart Message (if you have MyChart) OR A paper copy in the mail If you have any lab test that is abnormal or we need to change your treatment, we will call you to review the results.  Follow-Up: At [facility], you and your health needs are our priority.  As part of our continuing mission to provide you with exceptional heart care, we have created designated Provider Care Teams.  These Care Teams include your primary Cardiologist (physician) and Advanced Practice Providers (APPs -  Physician Assistants and Nurse Practitioners) who all work together to provide you with the care you need, when you need it.  We recommend signing up for the patient portal called "MyChart".  Sign up information is provided on this After Visit Summary.  MyChart is used to connect with patients for Virtual Visits (Telemedicine).  Patients are able to view lab/test results, encounter notes, upcoming appointments, etc.  Non-urgent messages can be sent to your provider as well.   To learn more about what you can do with MyChart, go to [email or web address].    Your next appointment:   6 month(s)  Provider:   Nicki Guadalajara, MD     Other Instructions A letter will be mailed to you as a reminder to call the office for your follow up appointment.

## 2023-05-26 ENCOUNTER — Encounter: Payer: Self-pay | Admitting: Cardiovascular Disease

## 2023-05-28 ENCOUNTER — Telehealth: Payer: Self-pay | Admitting: Cardiovascular Disease

## 2023-05-28 NOTE — Telephone Encounter (Signed)
There is no generic for Entresto. There is an alternative to [place] that is generic. However, if patient isnt approved through the drug company- they can get a grant from [email or web address] that would cover his copay

## 2023-05-28 NOTE — Telephone Encounter (Signed)
  Pt c/o medication issue:  1. Name of Medication: sacubitril-valsartan (ENTRESTO) 49-51 MG   2. How are you currently taking this medication (dosage and times per day)? Take 1 tablet by mouth 2 (two) times daily.   3. Are you having a reaction (difficulty breathing--STAT)? No   4. What is your medication issue? Pt's daughter calling and would like to ask if there's a generic for entresto. She is asking since they enroll the pt for pt assistance and not sure if the pt will be approve and would like to know if there's an alternative for this medication

## 2023-05-28 NOTE — Telephone Encounter (Signed)
Patient identification verified by 2 forms. Marilynn Rail, RN    Called and spoke to patients daughter Roxanne Mins message below  Vernona Rieger verbalized understanding, no questions at this time

## 2023-06-24 ENCOUNTER — Telehealth: Payer: Self-pay | Admitting: Cardiovascular Disease

## 2023-06-24 NOTE — Telephone Encounter (Signed)
 Called and spoke to patient's daughter. Verified name and DOB. She is calling to clarify if patient supposed to be on Farxiga  or Jardiance.  Last prescription sent to the pharmacy that came through as a refill from [facility] for [place]. Farxiga  was discontinued at that time. According to Dr Burnard notes for 12/10 patient he will continue on Farxiga . Verified with Dr Burnard and patient is supposed to be on Farxiga . Will send in new prescription when daughter call back with pharmacy.

## 2023-06-24 NOTE — Telephone Encounter (Signed)
 Pt c/o medication issue:  1. Name of Medication:   empagliflozin (JARDIANCE) 25 MG TABS tablet  sacubitril -valsartan  (ENTRESTO ) 49-51 MG   2. How are you currently taking this medication (dosage and times per day)?   3. Are you having a reaction (difficulty breathing--STAT)?   4. What is your medication issue? Patient's daughter is requesting call back to discuss these medications further. States she is having issue getting these meds through patient assistance and would like to discuss this further.

## 2023-06-25 NOTE — Telephone Encounter (Signed)
 Patient's daughter did return call. Verified name and DOB. She called to give name of the pharmacy to send Farxiga . I was unable to find pharmacy in database. She thought [facility] was who patient was getting medication from. Informed her medication is by [facility]. I tried call [facility] to find out where to send current prescription and they were closed for a meeting. Will try back later.

## 2023-06-28 MED ORDER — DAPAGLIFLOZIN PROPANEDIOL 10 MG PO TABS: 10.0000 mg | ORAL_TABLET | Freq: Every day | ORAL | 3 refills | Status: DC

## 2023-06-28 NOTE — Telephone Encounter (Signed)
 Called [facility] to get the name of the pharmacy for Farxiga patient assistance patients. Medication was sent to [facility] is [place]. [place]. Medication sent.

## 2023-07-05 ENCOUNTER — Telehealth: Payer: Self-pay | Admitting: Cardiovascular Disease

## 2023-07-05 NOTE — Telephone Encounter (Signed)
FWD to pharmacy

## 2023-07-05 NOTE — Telephone Encounter (Signed)
Patient calling the office for samples of medication:   1.  What medication and dosage are you requesting samples for? Mickle Plumb and Entresto  2.  Are you currently out of this medication? She said when she came Friday to pick them up- there were no samples at the front desk- She would like to pick them up today around 12:30

## 2023-07-06 MED ORDER — ENTRESTO 49-51 MG PO TABS: 1.0000 | ORAL_TABLET | Freq: Two times a day (BID) | ORAL | 0 refills | Status: DC

## 2023-07-06 MED ORDER — DAPAGLIFLOZIN PROPANEDIOL 10 MG PO TABS: 10.0000 mg | ORAL_TABLET | Freq: Every day | ORAL | 0 refills | Status: DC

## 2023-07-06 NOTE — Telephone Encounter (Signed)
Daughter Vernona Rieger) wants a call back to confirm samples for patient's Paul [name] and Paul [name] medications can be picked up today.

## 2023-07-06 NOTE — Telephone Encounter (Signed)
Patient identification verified by 2 forms. Paul Rail, RN    Called and spoke to patients daughter Paul [name] states:   -applied for patient assistance for Paul [name] and Paul [name]   -Still has not received update regarding approval for either  Informed Paul [name]:  -samples left in the front for Paul [name] and Paul [name] (2 week supply)   -will send message to Dr. Tresa Endo nurse for updates on patient assistance  Paul [name] has no further questions at this time

## 2023-07-08 ENCOUNTER — Telehealth: Payer: Self-pay | Admitting: Cardiovascular Disease

## 2023-07-08 MED ORDER — DAPAGLIFLOZIN PROPANEDIOL 10 MG PO TABS: 10.0000 mg | ORAL_TABLET | Freq: Every day | ORAL | 3 refills | Status: AC

## 2023-07-08 MED ORDER — ENTRESTO 49-51 MG PO TABS: 1.0000 | ORAL_TABLET | Freq: Two times a day (BID) | ORAL | 5 refills | Status: DC

## 2023-07-08 MED ORDER — ENTRESTO 49-51 MG PO TABS: 1.0000 | ORAL_TABLET | Freq: Two times a day (BID) | ORAL | 3 refills | Status: AC

## 2023-07-08 NOTE — Telephone Encounter (Signed)
Patient identification verified by 2 forms. Marilynn Rail, RN    Called and spoke to patients daughter Darrell Jewel states:    -patient was approved for [place] patient assistance   -[employer] is requesting additional information for approval   -Entresto patient assistance was not approved   -was informed by pharmacy he will pay $600 for first prescription for Sherryll Burger will then decrease to $47/month  -patient would like to know if there are any grants for Sherryll Burger  Informed Vernona Rieger:   -Likely $600 cost for entresto is due to deductible   Vernona Rieger verbalized understanding, no questions at this time

## 2023-07-08 NOTE — Telephone Encounter (Signed)
Spoke with patients daughter. Patient enrolled in [facility] Cardiomyopathy fund for [place] Rx sent to [facility]    CARD NO.?[id number]   CARD STATUS?Active   MVH?[id number]   PCN?[id number]   PC [id number] Called info into [facility] Pt daughter made aware.

## 2023-07-08 NOTE — Telephone Encounter (Signed)
Patient is calling to talk with a nurse regarding patient assistance program

## 2023-07-12 NOTE — Telephone Encounter (Signed)
Daughter called and said patient got a shipment of Farxiga from [place] [employer] [place]. Pt would like to stick with grant. Advised she or he can call and asked to be disenrolled in pt ass program.

## 2023-07-29 NOTE — Telephone Encounter (Signed)
Acknowledged.

## 2023-07-29 NOTE — Telephone Encounter (Signed)
Per message from Malena Peer D, RPH-CPP (copy and paste) Daughter called, said patient got a shipment of Farxiga from [facility] [employer]. Pt would like to stick with grant. Advised she or he can call and asked to be disenrolled in pt ass program.

## 2023-10-16 ENCOUNTER — Other Ambulatory Visit: Payer: Self-pay | Admitting: Cardiovascular Disease

## 2023-11-01 ENCOUNTER — Other Ambulatory Visit: Payer: Self-pay | Admitting: Cardiovascular Disease

## 2023-11-22 ENCOUNTER — Ambulatory Visit: Payer: Medicare Other | Attending: Cardiovascular Disease | Admitting: Cardiovascular Disease

## 2023-11-22 ENCOUNTER — Other Ambulatory Visit: Payer: Self-pay | Admitting: *Deleted

## 2023-11-22 ENCOUNTER — Encounter: Payer: Self-pay | Admitting: Cardiovascular Disease

## 2023-11-22 VITALS — BP 126/76 | HR 69 | Ht 70.0 in | Wt 167.0 lb

## 2023-11-22 DIAGNOSIS — I5020 Unspecified systolic (congestive) heart failure: Secondary | ICD-10-CM

## 2023-11-22 DIAGNOSIS — Z951 Presence of aortocoronary bypass graft: Secondary | ICD-10-CM | POA: Insufficient documentation

## 2023-11-22 DIAGNOSIS — I2510 Atherosclerotic heart disease of native coronary artery without angina pectoris: Secondary | ICD-10-CM | POA: Insufficient documentation

## 2023-11-22 DIAGNOSIS — E785 Hyperlipidemia, unspecified: Secondary | ICD-10-CM

## 2023-11-22 DIAGNOSIS — I255 Ischemic cardiomyopathy: Secondary | ICD-10-CM | POA: Insufficient documentation

## 2023-11-22 NOTE — Progress Notes (Signed)
 Cardiology  Office Note    Date:  11/22/2023   ID:  Roper Tolson, DOB May 02, 1941, MRN [id number]  PCP:  Linford Ribas, MD  Cardiologist:  Magnus Schuller, MD   6 month follow-up office visit  History of Present Illness:  Paul [name] is a 83 y.o. male who is from [place] .  I saw him when he was admitted to [facility] on August 18, 2020.  At that time he had a 30-month history of episodic exertionally precipitated chest discomfort.  Initially he did not seek medical attention and more recently had seen his primary care provider and was hypoxic prompting hospitalization at [facility] in [place] .  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  He was heparinized and transferred to [facility].  I recommended he undergo a cardiac MRI to assess for LV viability.  A 2D echo Doppler study showed reduced LV function with EF at 30 to 35%.  He was found to have grade 3 diastolic dysfunction and significant increased pulmonary artery systolic pressure at 70 mm with moderate tricuspid regurgitation.  He ultimately underwent CABG revascularization surgery x3 by Dr. Linder Revere and had a LIMA graft placed to the OM1, SVG to OM 2, and SVG to the posterolateral vessel on August 23, 2021.  He returned to the OR the following day for expectation of being studied for bleeding.  Postoperative course was complicated by volume overload and PAF with RVR for which she was placed on amiodarone  therapy.  He also required midodrine  for hypotension.  He was subsequently seen for his initial hospital visit by Ervin Heath, PA on September 11, 2020.  The patient has been seen by Dr. Sherene Dilling and had  developed a left pleural effusion and required left thoracentesis on Nov 05, 2020.  575 cc of dark red fluid was removed.  Follow-up chest x-ray did not reveal any pneumothorax.  There was a small residual effusion which remained.  It was recommended he continue Lasix  40 mg daily.  I saw him for my initial evaluation with me following his hospitalization on December 12, 2020.  At that time he was here with his daughter who lives in [place] and is a Midwife at [employer].  He was scheduled to undergo a follow-up chest x-ray today but since he was running behind he did not get this done prior to his office visit.  He denied any recurrence of chest pain or shortness of breath and has been participating in cardiac rehab in [place] .  At times he had experienced lower extremity edema around his ankles.  He was unaware of palpitations.  His breathing has improved.  His ECG showed sinus rhythm with QS complex consistent with his anteroseptal myocardial infarction.  With his leg edema I suggested he continue furosemide  20 mg twice a day.  I recommended he undergo a follow-up echo Doppler study to reassess LV systolic and diastolic function as well as pulmonary pressures.  He had stage III CKD with creatinine 1.45 and follow-up laboratory was recommended.  He underwent a follow-up chest x-ray following his office visit which showed improvement in his left pleural effusion compared to his prior November 15, 2019 evaluation.  On January 02, 2021 he underwent a follow-up echo Doppler study.  EF continues to be severely reduced at 25 to 30% with grade 3 diastolic dysfunction consistent with restrictive physiology.  He had elevated left atrial pressure.  There was mild mitral regurgitation and mild to moderate aortic sclerosis without stenosis.  Estimated RV systolic pressure was 42.4 mm.  He continues to participate in cardiac rehab.  He denies any chest pain.    I saw him for an office evaluation on January 20, 2021.  At that time, he was breathing better and since his edema was significantly improved I recommended he reduce Lasix  from 20 mg twice a day to 20 mg daily.  At that time I initiated Entresto  at 24/26 mg twice a day and provided him with samples.  Recommended he discontinue his potassium supplementation.  With his resting pulse of 76 I added low-dose metoprolol  succinate at 12.5 mg.  I recommended he reduce his aspirin  to 81 mg.  He continues to be on atorvastatin  40 mg for hyperlipidemia.  I recommended that he follow-up with our pharmacist in 3 to 4 weeks after initiation of Entresto .  As result, he was subsequently evaluated on February 03, 2021.  Evaluation due to low blood pressure readings his Entresto  was not increased.  He was started on SGLT2 inhibition was started on Farxiga  10 mg.  I saw him on March 18, 2021.  He participated in cardiac rehab and derived significant benefit.  He states his blood pressures at home typically run around 100-108 systolically.  He denied recurrent chest pain or edema or shortness of breath.  During that evaluation, his blood pressure remained low and he did not have significant edema.  I recommended he discontinue furosemide  in addition to potassium chloride .  I suggested the addition of low-dose spironolactone  12.5 mg.  Creatinine was 1.24.  Potassium was 5.1.  I saw him on June 20, 2021.  He underwent laboratory in April 24, 2021.  NT proBNP was 06/17/2000.  He has participated in cardiac rehab.  He is here with his daughter today.  He has a record log of his blood pressures and his blood pressures at home typically run in the 100-1 05 range.  He apparently had a urinary tract infection requiring antibiotic therapy in November.  He denies any chest pain.  Presently, he is on aspirin  81 mg, Entresto  24/26 twice a day, Farxiga  10 mg daily, metoprolol  12.5 mg daily.  He does not have any significant leg swelling.   When I saw him on October 01, 2021 he remained  asymptomatic.  He specifically denied any shortness of breath or chest pain.  He was walking at least 30 minutes every day.  He continued to be on Entresto  24/26 mg twice a day, metoprolol  succinate 12.5 mg daily, Farxiga  10 mg daily, and now takes spironolactone  12.5 mg every third day.  Remotely he had a history of elevated potassium.  Most recent laboratory done on September 01, 2021 by Drs. Linford Ribas showed a creatinine of 1.18.  BUN 21.  Potassium 4.5.  LFTs are normal.  Lipid studies reveal total cholesterol 124, triglycerides 111, HDL 45, and LDL 59.  Hemoglobin 15.7 hematocrit 46.0.   I saw him on May 13, 2022.  At that time he continued to feel well.  He denies any dizziness or significant shortness of breath.  His blood pressure has become controlled and not low and typically may run around 110-130s systolically with pulse in the 70s.  Previously, he continued to have elevated pro BNP and blood pressure had been too low to further titrate Entresto .  He is here with his daughter.  He is unaware of any palpitations.  He uses support stockings daily.  At times there is trivial residual edema.  He continues to be on Entresto  24/26 mg twice a day, Farxiga  10 mg daily, metoprolol  succinate 12.5 mg in addition to spironolactone  12.5 mg every third day.  During that evaluation, with blood pressure stability I attempted to titrate Entresto  to 49/51 mg twice a day.  I recommended in several weeks to have follow-up laboratory and proBNP.  I scheduled him for follow-up echo Doppler study.  I saw him on Oct 19, 2022.  He has continued to do exceptionally well.  He denies shortness of breath or significant leg swelling.  He is walking daily.  Blood pressures typically run in the 120 range.  His heart rate typically is in the mid 60s to 70s.  He underwent an echo Doppler study on August 14, 2022 which showed normal LV function now at 55%.  There was moderate LVH, normal RV systolic pressure.  There was mitral  annular calcification and aortic valve sclerosis without stenosis.  He is here with his daughter for follow-up evaluation.  I last saw him on May 25, 2023 at which time he continued to feel well.  He was now seeing Linford Ribas, MD in [place].  He remains fairly active.  He continues to be on Entresto  49/51 mg twice a day, spironolactone  12.5 mg every 3 days, metoprolol  succinate 12.5 mg, and Farxiga  10 mg daily.  He is on atorvastatin  40 mg for hyperlipidemia and takes baby aspirin .  He denies chest pain PND orthopnea, palpitations, presyncope or syncope.  He presents for evaluation.  He is here with his daughter.  Since I last saw him he has remained stable.  He walks for minimum of 30 minutes each day and remains active.  He continues to be on guideline directed medical therapy with Farxiga  10 mg, Entresto  49/51 mg twice a day, metoprolol  succinate 12.5 mg and spironolactone  which she currently takes 12.5 mg 2 times per week.  He is on atorvastatin  40 mg daily for lipid management and takes aspirin  81 mg.  He is here with his daughter.  Past Medical History:  Diagnosis Date   Glaucoma    High blood pressure    High cholesterol     Past Surgical History:  Procedure Laterality Date   CARDIAC CATHETERIZATION  08/19/2020   CHEST EXPLORATION N/A 08/23/2020   Procedure: RE- EXPLORATION POST CABG;  Surgeon: Bartley Lightning, MD;  Location: [facility] OR;  Service: Thoracic;  Laterality: N/A;   CORONARY ARTERY BYPASS GRAFT N/A 08/23/2020   Procedure: CORONARY ARTERY BYPASS GRAFTING (CABG) X   THREE , LEFT INTERNAL MAMMARY HARVEST, RIGHT LEG ENDOSCOPIC SAPHENOUS VEIN HARVESTING, LEFT ENDOSCOPIC SAPHENOUS VEIN HARVEST;  Surgeon: Bartley Lightning, MD;  Location: [facility] OR;  Service: Open Heart Surgery;  Laterality: N/A;   IR THORACENTESIS ASP PLEURAL SPACE W/IMG GUIDE  11/05/2020   TEE WITHOUT CARDIOVERSION N/A 08/23/2020   Procedure: TRANSESOPHAGEAL ECHOCARDIOGRAM (TEE);  Surgeon: Bartley Lightning, MD;   Location: [facility] OR;  Service: Open Heart Surgery;  Laterality: N/A;   WRIST FRACTURE SURGERY Right    "years ago"    Current Medications: Outpatient Medications Prior to Visit  Medication Sig Dispense Refill   aspirin  EC 81 MG tablet Take 1 tablet (81 mg total) by  mouth daily. Swallow whole. 90 tablet 3   atorvastatin  (LIPITOR) 40 MG tablet TAKE 1 TABLET BY MOUTH EVERY DAY 90 tablet 2   dapagliflozin  propanediol (FARXIGA ) 10 MG TABS tablet Take 1 tablet (10 mg total) by mouth daily before breakfast. 90 tablet 3   finasteride  (PROSCAR ) 5 MG tablet Take 1 tablet (5 mg total) by mouth daily. 30 tablet 1   latanoprost  (XALATAN ) 0.005 % ophthalmic solution Place 1 drop into both eyes at bedtime.     metoprolol  succinate (TOPROL -XL) 25 MG 24 hr tablet TAKE 1/2 TABLET BY MOUTH DAILY 45 tablet 2   Multiple Vitamin (MULTIVITAMIN WITH MINERALS) TABS tablet Take 1 tablet by mouth daily.     sacubitril -valsartan  (ENTRESTO ) 49-51 MG Take 1 tablet by mouth 2 (two) times daily. 180 tablet 3   spironolactone  (ALDACTONE ) 25 MG tablet TAKE 0.5 TABLET (12.5 MG TOTAL) BY MOUTH 2 (TWO) TIMES A WEEK. TAKE EVERY 3 DAYS 39 tablet 1   No facility-administered medications prior to visit.     Allergies:   Patient has no known allergies.   Social History   Socioeconomic History   Marital status: Widowed    Spouse name: Not on file   Number of children: Not on file   Years of education: Not on file   Highest education level: Not on file  Occupational History   Not on file  Tobacco Use   Smoking status: Never   Smokeless tobacco: Never  Substance and Sexual Activity   Alcohol use: Not Currently   Drug use: Never   Sexual activity: Not on file  Other Topics Concern   Not on file  Social History Narrative   Not on file   Social Drivers of Health   Financial Resource Strain: Not on file  Food Insecurity: Not on file  Transportation Needs: Not on file  Physical Activity: Not on file  Stress: Not on  file  Social Connections: Not on file     Family History:  The patient's family history is not on file.  Parents are deceased.  ROS General: Negative; No fevers, chills, or night sweats;  HEENT: Negative; No changes in vision or hearing, sinus congestion, difficulty swallowing Pulmonary: Negative; No cough, wheezing, shortness of breath, hemoptysis Cardiovascular: See HPI GI: Negative; No nausea, vomiting, diarrhea, or abdominal pain GU: Negative; No dysuria, hematuria, or difficulty voiding Musculoskeletal: Negative; no myalgias, joint pain, or weakness Hematologic/Oncology: Negative; no easy bruising, bleeding Endocrine: Negative; no heat/cold intolerance; no diabetes Neuro: Negative; no changes in balance, headaches Skin: Negative; No rashes or skin lesions Psychiatric: Negative; No behavioral problems, depression Sleep: Negative; No snoring, daytime sleepiness, hypersomnolence, bruxism, restless legs, hypnogognic hallucinations, no cataplexy Other comprehensive 14 point system review is negative.   PHYSICAL EXAM:   VS:  BP 126/76   Pulse 69   Ht 5\' 10"  (1.778 m)   Wt 167 lb (75.8 kg)   SpO2 96%   BMI 23.96 kg/m     Repeat blood pressure by me was 128/78  Wt Readings from Last 3 Encounters:  11/22/23 167 lb (75.8 kg)  05/25/23 166 lb 3.2 oz (75.4 kg)  10/19/22 169 lb 6.4 oz (76.8 kg)   General: Alert, oriented, no distress.  Skin: normal turgor, no rashes, warm and dry HEENT: Normocephalic, atraumatic. Pupils equal round and reactive to light; sclera anicteric; extraocular muscles intact;  Nose without nasal septal hypertrophy Mouth/Parynx benign; Mallinpatti scale 2/3 Neck: No JVD, no carotid bruits; normal carotid upstroke Lungs: clear  to ausculatation and percussion; no wheezing or rales Chest wall: without tenderness to palpitation Heart: PMI not displaced, RRR, s1 s2 normal, 1-2/6 systolic murmur in aortic area, no diastolic murmur, no rubs, gallops, thrills, or  heaves Abdomen: soft, nontender; no hepatosplenomehaly, BS+; abdominal aorta nontender and not dilated by palpation. Back: no CVA tenderness Pulses 2+ Musculoskeletal: full range of motion, normal strength, no joint deformities Extremities: no clubbing cyanosis or edema, Homan's sign negative  Neurologic: grossly nonfocal; Cranial nerves grossly wnl Psychologic: Normal mood and affect   Studies/Labs Reviewed:   EKG Interpretation Date/Time:  Monday November 22 2023 08:11:35 EDT Ventricular Rate:  69 PR Interval:  206 QRS Duration:  86 QT Interval:  372 QTC Calculation: 398 R Axis:   -12  Text Interpretation: Normal sinus rhythm Low voltage QRS Nonspecific T wave abnormality When compared with ECG of 25-May-2023 09:56, No significant change was found Confirmed by Magnus Schuller ([id number]) on 11/22/2023 8:47:03 AM    May 25, 2023 ECG (independently read by me): NSR at 68, nonspecific T wave abnormality  Oct 19, 2022 ECG (independently read by me): NSR at 70; nonspecific T wave abnormality  May 13, 2022 ECG (independently read by me): NSR at 66, QS V1-3  October 01, 2021 ECG (independently read by me):  NSR at 71; mild T wave abnormality V2-4  June 20, 2021 ECG (independently read by me): NSR at 73; T wave inversion V4-6  March 18, 2021 ECG (independently read by me):  NSR at 63, QS V1-3, T inversion V1-5, QTc 446 msec  January 20, 2021 ECG (independently read by me): Normal sinus rhythm at 69 bpm.  Evidence for prior anteroseptal MI with QS complex V1 through V3.  Mild T wave abnormalities.  Normal intervals.  No ectopy  Recent Labs:    Latest Ref Rng & Units 05/29/2022   11:48 AM 07/11/2021   10:59 AM 04/24/2021   11:55 AM  BMP  Glucose 70 - 99 mg/dL 93  91  73   BUN 8 - 27 mg/dL 16  16  22    Creatinine 0.76 - 1.27 mg/dL 6.04  5.40  9.81   BUN/Creat Ratio 10 - 24 15  13  18    Sodium 134 - 144 mmol/L 139  147  141   Potassium 3.5 - 5.2 mmol/L 4.7  4.7  5.1   Chloride 96 -  106 mmol/L 100  100  100   CO2 20 - 29 mmol/L 24  27  26    Calcium  8.6 - 10.2 mg/dL 9.4  9.8  9.5         Latest Ref Rng & Units 05/29/2022   11:48 AM 07/11/2021   10:59 AM 01/28/2021   10:32 AM  Hepatic Function  Total Protein 6.0 - 8.5 g/dL 7.0  7.3  6.9   Albumin  3.7 - 4.7 g/dL 4.2  4.2  4.0   AST 0 - 40 IU/L 16  16  15    ALT 0 - 44 IU/L 15  14  12    Alk Phosphatase 44 - 121 IU/L 65  73  87   Total Bilirubin 0.0 - 1.2 mg/dL 0.7  0.9  0.8        Latest Ref Rng & Units 07/11/2021   10:59 AM 01/03/2021    8:08 AM 09/11/2020    3:22 PM  CBC  WBC 3.4 - 10.8 x10E3/uL 7.6  7.1  6.9   Hemoglobin 13.0 - 17.7 g/dL 19.1  47.8  29.5  Hematocrit 37.5 - 51.0 % 42.8  40.5  31.8   Platelets 150 - 450 x10E3/uL 253  238  409    Lab Results  Component Value Date   MCV 97 07/11/2021   MCV 87 01/03/2021   MCV 96 09/11/2020   Lab Results  Component Value Date   TSH 3.790 01/03/2021   Lab Results  Component Value Date   HGBA1C 6.2 (H) 08/19/2020     BNP    Component Value Date/Time   BNP 1,109.1 (H) 08/19/2020 0358    ProBNP    Component Value Date/Time   PROBNP 651 (H) 05/29/2022 1148     Lipid Panel     Component Value Date/Time   CHOL 107 01/03/2021 0808   TRIG 98 01/03/2021 0808   HDL 42 01/03/2021 0808   CHOLHDL 2.5 01/03/2021 0808   CHOLHDL 2.6 08/19/2020 0358   VLDL 16 08/19/2020 0358   LDLCALC 46 01/03/2021 0808   LABVLDL 19 01/03/2021 0808     RADIOLOGY: No results found.   Additional studies/ records that were reviewed today include:    LHC: 08/18/2020 Hemodynamics: Aortic pressure 91/46, LV pressure 91, LVEDP LM: distal 50% stenosis LAD: proximal 75-80% stenosis. Mid LAD subtotal stenosis with faint filling, finally TIMI-2 filling of distal LAD. - Diagonal 1 25-30% stenosis, not a large vessel - Diagonal 2 ostial 8% stenosis LCX: mid vessel with 99% stenosis with ulceration - OM1: proximal 75% stenosis - OM2: small vessel RCA: dominant  vessel. Mid RCA with 99% stenosis, jump bridging collaterals which were right -right collaterals. RCA collateralized LAD  Echo 08/19/2020  1. Left ventricular ejection fraction, by estimation, is 25 to 30%. The  left ventricle has severely decreased function.   2. The left ventricle demonstrates regional wall motion abnormalities .  All mid-to-apical septal, mid-to-apical anterolateral, mid-to-apical  anterior, apical inferior and apex appear akinetic. The rest of the LV  walls are hypokinetic.   3. There is mild concentric left ventricular hypertrophy. Left  ventricular diastolic parameters are consistent with Grade II diastolic  dysfunction (pseudonormalization).   4. Swirling of contrast seen in the left ventricle consistent with low  flow state. No LV thrombus visualized.   5. Right ventricular systolic function is normal. The right ventricular  size is normal.   6. The mitral valve is abnormal. Trivial mitral valve regurgitation. No  evidence of mitral stenosis. Moderate mitral annular calcification.   7. The aortic valve has an indeterminant number of cusps. There is  moderate calcification of the aortic valve. There is moderate thickening  of the aortic valve. Aortic valve regurgitation is not visualized. Mild  aortic valve stenosis. Gradients low due  to low output state.   8. Wall motion concerning for multivessel coronary artery disease.    ECHO: 01/02/2021 IMPRESSIONS   1. Akinesis of the distal anterior, anteroseptal, inferior and apical  walls; overall severe LV dysfunction.   2. Left ventricular ejection fraction, by estimation, is 25 to 30%. The  left ventricle has severely decreased function. The left ventricle  demonstrates regional wall motion abnormalities (see scoring  diagram/findings for description). Left ventricular  diastolic parameters are consistent with Grade III diastolic dysfunction  (restrictive). Elevated left atrial pressure.   3. Right ventricular  systolic function is normal. The right ventricular  size is mildly enlarged. There is mildly elevated pulmonary artery  systolic pressure.   4. The mitral valve is normal in structure. Mild mitral valve  regurgitation. No evidence of mitral  stenosis.   5. The aortic valve is tricuspid. Aortic valve regurgitation is trivial.  Mild to moderate aortic valve sclerosis/calcification is present, without  any evidence of aortic stenosis.   6. The inferior vena cava is normal in size with greater than 50%  respiratory variability, suggesting right atrial pressure of 3 mmHg.    ECHO: 08/14/2022  1. Left ventricular ejection fraction, by estimation, is 50 to 55%. The  left ventricle has low normal function. The left ventricle has no regional  wall motion abnormalities. There is moderate left ventricular hypertrophy.  Left ventricular diastolic  parameters are indeterminate.   2. Right ventricular systolic function is normal. The right ventricular  size is normal. There is normal pulmonary artery systolic pressure. The  estimated right ventricular systolic pressure is 28.0 mmHg.   3. The mitral valve is normal in structure. Trivial mitral valve  regurgitation. No evidence of mitral stenosis. Moderate mitral annular  calcification.   4. The aortic valve is tricuspid. There is moderate calcification of the  aortic valve. There is moderate thickening of the aortic valve. Aortic  valve regurgitation is not visualized. Aortic valve sclerosis is present,  with no evidence of aortic valve  stenosis.   5. The inferior vena cava is normal in size with greater than 50%  respiratory variability, suggesting right atrial pressure of 3 mmHg.   Comparison(s): Prior images reviewed side by side. The left ventricular  function has improved.   ASSESSMENT:    1. Coronary artery disease involving native coronary artery of native heart without angina pectoris   2. Hyperlipidemia, unspecified hyperlipidemia type    3. S/P CABG x 3: Dr. Sherene Dilling on August 23, 2020   4. Ischemic cardiomyopathy: EF subsequently normalized   5. Hyperlipidemia with target LDL less than 70     PLAN:  Mr. Paul [name] is a very pleasant 83 year-old gentleman who had developed progressive exertional shortness of breath leading to hypoxia requiring prompt hospital admission to [facility] in [place]  in March 2022.  Initial BNP was 1255.  CTA of his chest was negative for PE but showed pulmonary edema with bilateral effusions.  Cardiac catheterization on August 18, 2020  showed severe multivessel CAD and ultimately he underwent successful CABG revascularization surgery by Dr. Sherene Dilling on August 23, 2020.  His postop course was complicated by atrial fibrillation with RVR for which he required amiodarone .  He also developed bilateral effusions and ultimately required subsequent thoracentesis in May 2022.  When I saw him for his initial evaluation with me on December 12, 2020 he was feeling significantly improved.  He has not had recurrent anginal symptoms.  He has been successfully started on Entresto  24/26 mg twice a day and has tolerated the addition of Farxiga  10 mg in addition to metoprolol  succinate 12.5 mg daily.  A proBNP in November 2022 remained elevated at 1302.  In November creatinine was 1.24.  At his  office visit in January 2023 blood pressure remained low and for this reason I did not further titrate Entresto .  ProBNP remains elevated but slightly improved at 1299.  At that time  I rechallenge him with low-dose spironolactone  12.5 for aldosterone blockade and recommended he take this every third day.  Laboratory from his primary provider from September 01, 2021 showed a potassium of 4.5.  BUN 21 creatinine 1.18.  proBNP had improved to 651 on May 29, 2022.  He he had stabilized and subsequently Entresto  was titrated to 49/51 mg twice a  day.  His most recent echo Doppler study in March 2024 showed normalization of LV function with EF  at 50 to 55%.  He had normal pulmonary artery systolic pressure.  There was moderate mitral annular calcification and aortic sclerosis.  Lipid studies in March 2024 showed total cholesterol 132, triglycerides 113, HDL 53 and LDL 59.  Over the last several visits he has continued to do exceptionally well.  Presently, he is euvolemic on exam.  He denies any chest pain or shortness of breath.  He is walking at least 30 minutes every day and still does some yard activity.  He continues to be on guideline directed medical therapy with Farxiga  10 mg, Entresto  49/51 mg twice a day, Toprol  succinate 12.5 mg in addition to spironolactone  12.5 mg 2 times per week.  He is on atorvastatin  40 mg.  He has eaten today.  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  In January 2026 I have recommended he undergo a 2-year follow-up echo Doppler study for reassessment of LV function.  He and his daughter are aware of my imminent retirement.  I will transition him to the care of Dr. Alvis Ba and we will plan for his evaluation in February 2026 or sooner as needed.   Medication Adjustments/Labs and Tests Ordered: Current medicines are reviewed at length with the patient today.  Concerns regarding medicines are outlined above.  Medication changes, Labs and Tests ordered today are listed in the Patient Instructions below. Patient Instructions  Medication Instructions:  Your physician recommends that you continue on your current medications as directed. Please refer to the Current Medication list given to you today.  *If you need a refill on your cardiac medications before your next appointment, please call your pharmacy*  Lab Work: GO TO [facility] IN [place], FASTING, FOR:  LIPID, CMET, CBC, LPa, PRO BNP, & TSH  If you have labs (blood work) drawn today and your  tests are completely normal, you will receive your results only by: MyChart Message (if you have MyChart) OR A paper copy in the mail If you have any lab test that is abnormal or we need to change your treatment, we will call you to review the results.  Testing/Procedures: Your physician has requested that you have an echocardiogram IN JANUARY 2026.  Echocardiography is a painless test that uses sound waves to create images of your heart. It provides your doctor with information about the size and shape of your heart and how well your heart's chambers and valves are working. This procedure takes approximately one hour. There are no restrictions for this procedure. Please do NOT wear cologne, perfume, aftershave, or lotions (deodorant is allowed). Please arrive 15 minutes prior to your appointment time.  Please note: We ask at that you not bring children with you during ultrasound (echo/ vascular) testing. Due to room size and safety concerns, children are not allowed in the ultrasound rooms during exams. Our front office staff cannot provide observation of children in our lobby area while testing is being conducted. An adult accompanying a patient to their appointment will only be allowed in the ultrasound room at the discretion of the ultrasound technician under special circumstances. We apologize for any inconvenience.   Follow-Up: At [facility], you and your health needs are our priority.  As part of our continuing mission  to provide you with exceptional heart care, our providers are all part of one team.  This team includes your primary Cardiologist (physician) and Advanced Practice Providers or APPs (Physician Assistants and Nurse Practitioners) who all work together to provide you with the care you need, when you need it.  Your next appointment:   February 2026  Provider:   Luana Rumple, MD    We recommend signing up for the patient portal called "MyChart".  Sign up  information is provided on this After Visit Summary.  MyChart is used to connect with patients for Virtual Visits (Telemedicine).  Patients are able to view lab/test results, encounter notes, upcoming appointments, etc.  Non-urgent messages can be sent to your provider as well.   To learn more about what you can do with MyChart, go to [email or web address].   Other Instructions        Signed, Magnus Schuller, MD  11/22/2023 5:08 PM    [facility] [address], [address], [place], [place]  [address] Phone: [phone]

## 2023-11-22 NOTE — Patient Instructions (Signed)
 Medication Instructions:  Your physician recommends that you continue on your current medications as directed. Please refer to the Current Medication list given to you today.  *If you need a refill on your cardiac medications before your next appointment, please call your pharmacy*  Lab Work: GO TO [facility] IN [place], FASTING, FOR:  LIPID, CMET, CBC, LPa, PRO BNP, & TSH  If you have labs (blood work) drawn today and your tests are completely normal, you will receive your results only by: MyChart Message (if you have MyChart) OR A paper copy in the mail If you have any lab test that is abnormal or we need to change your treatment, we will call you to review the results.  Testing/Procedures: Your physician has requested that you have an echocardiogram IN JANUARY 2026.  Echocardiography is a painless test that uses sound waves to create images of your heart. It provides your doctor with information about the size and shape of your heart and how well your heart's chambers and valves are working. This procedure takes approximately one hour. There are no restrictions for this procedure. Please do NOT wear cologne, perfume, aftershave, or lotions (deodorant is allowed). Please arrive 15 minutes prior to your appointment time.  Please note: We ask at that you not bring children with you during ultrasound (echo/ vascular) testing. Due to room size and safety concerns, children are not allowed in the ultrasound rooms during exams. Our front office staff cannot provide observation of children in our lobby area while testing is being conducted. An adult accompanying a patient to their appointment will only be allowed in the ultrasound room at the discretion of the ultrasound technician under special circumstances. We apologize for any inconvenience.   Follow-Up: At [facility], you and your health needs are our priority.  As part of our continuing mission to provide you with exceptional heart  care, our providers are all part of one team.  This team includes your primary Cardiologist (physician) and Advanced Practice Providers or APPs (Physician Assistants and Nurse Practitioners) who all work together to provide you with the care you need, when you need it.  Your next appointment:   February 2026  Provider:   Luana Rumple, MD    We recommend signing up for the patient portal called "MyChart".  Sign up information is provided on this After Visit Summary.  MyChart is used to connect with patients for Virtual Visits (Telemedicine).  Patients are able to view lab/test results, encounter notes, upcoming appointments, etc.  Non-urgent messages can be sent to your provider as well.   To learn more about what you can do with MyChart, go to [email or web address].   Other Instructions

## 2023-11-23 LAB — LIPID PANEL

## 2023-11-24 LAB — CBC
Hematocrit: 47.7 % (ref 37.5–51.0)
Hemoglobin: 15.7 g/dL (ref 13.0–17.7)
MCH: 34 pg — ABNORMAL HIGH (ref 26.6–33.0)
MCHC: 32.9 g/dL (ref 31.5–35.7)
MCV: 103 fL — ABNORMAL HIGH (ref 79–97)
Platelets: 266 10*3/uL (ref 150–450)
RBC: 4.62 x10E6/uL (ref 4.14–5.80)
RDW: 12.4 % (ref 11.6–15.4)
WBC: 8.5 10*3/uL (ref 3.4–10.8)

## 2023-11-24 LAB — TSH: TSH: 2.4 u[IU]/mL (ref 0.450–4.500)

## 2023-11-24 LAB — COMPREHENSIVE METABOLIC PANEL WITH GFR
ALT: 13 IU/L (ref 0–44)
AST: 14 IU/L (ref 0–40)
Albumin: 4.1 g/dL (ref 3.7–4.7)
Alkaline Phosphatase: 69 IU/L (ref 44–121)
BUN/Creatinine Ratio: 15 (ref 10–24)
BUN: 19 mg/dL (ref 8–27)
Bilirubin Total: 0.8 mg/dL (ref 0.0–1.2)
CO2: 22 mmol/L (ref 20–29)
Calcium: 9.2 mg/dL (ref 8.6–10.2)
Chloride: 103 mmol/L (ref 96–106)
Creatinine, Ser: 1.24 mg/dL (ref 0.76–1.27)
Globulin, Total: 2.5 g/dL (ref 1.5–4.5)
Glucose: 111 mg/dL — ABNORMAL HIGH (ref 70–99)
Potassium: 4.5 mmol/L (ref 3.5–5.2)
Sodium: 143 mmol/L (ref 134–144)
Total Protein: 6.6 g/dL (ref 6.0–8.5)
eGFR: 58 mL/min/{1.73_m2} — ABNORMAL LOW (ref >59)

## 2023-11-24 LAB — LIPID PANEL
Cholesterol, Total: 122 mg/dL (ref 100–199)
HDL: 50 mg/dL (ref >39)
LDL CALC COMMENT:: 2.4 ratio (ref 0.0–5.0)
LDL Chol Calc (NIH): 52 mg/dL (ref 0–99)
Triglycerides: 108 mg/dL (ref 0–149)
VLDL Cholesterol Cal: 20 mg/dL (ref 5–40)

## 2023-11-24 LAB — PRO B NATRIURETIC PEPTIDE: NT-Pro BNP: 429 pg/mL (ref 0–486)

## 2023-11-24 LAB — LIPOPROTEIN A (LPA): Lipoprotein (a): 195.9 nmol/L — ABNORMAL HIGH (ref <75.0)

## 2023-11-29 ENCOUNTER — Ambulatory Visit: Payer: Self-pay | Admitting: Cardiovascular Disease

## 2024-01-31 ENCOUNTER — Other Ambulatory Visit: Payer: Self-pay

## 2024-02-01 MED ORDER — ATORVASTATIN CALCIUM 40 MG PO TABS: 40.0000 mg | ORAL_TABLET | Freq: Every day | ORAL | 3 refills | Status: AC

## 2024-03-30 ENCOUNTER — Other Ambulatory Visit: Payer: Self-pay

## 2024-03-31 MED ORDER — SPIRONOLACTONE 25 MG PO TABS: 25.0000 mg | ORAL_TABLET | ORAL | 0 refills | Status: DC

## 2024-06-15 ENCOUNTER — Other Ambulatory Visit: Payer: Self-pay | Admitting: Physician Assistant

## 2024-06-23 ENCOUNTER — Ambulatory Visit (HOSPITAL_COMMUNITY)
Admission: RE | Admit: 2024-06-23 | Discharge: 2024-06-23 | Disposition: A | Source: Ambulatory Visit | Attending: Cardiovascular Disease | Admitting: Cardiovascular Disease

## 2024-06-23 DIAGNOSIS — I2510 Atherosclerotic heart disease of native coronary artery without angina pectoris: Secondary | ICD-10-CM | POA: Diagnosis not present

## 2024-06-23 DIAGNOSIS — E785 Hyperlipidemia, unspecified: Secondary | ICD-10-CM | POA: Insufficient documentation

## 2024-06-23 LAB — ECHOCARDIOGRAM COMPLETE: S' Lateral: 2.88 cm

## 2024-06-23 MED ORDER — PERFLUTREN LIPID MICROSPHERE
1.0000 mL | INTRAVENOUS | Status: AC | PRN
  Administered 2024-06-23: 2 mL via INTRAVENOUS

## 2024-06-27 ENCOUNTER — Telehealth: Payer: Self-pay | Admitting: Pharmacy Technician

## 2024-06-27 ENCOUNTER — Telehealth: Payer: Self-pay | Admitting: Cardiovascular Disease

## 2024-06-27 NOTE — Telephone Encounter (Signed)
 Patient Advocate Encounter   The patient was approved for a [employer] grant that will help cover the cost of Entresto  & Farxiga  Total amount awarded, 7500.00.  Effective: 06/07/24 - 06/06/25   APW:[id number] ERW:[id number] Hmnle:[id number] PI:[id number]  [employer] ID: [id number]   Pharmacy provided with approval and processing information. Patient informed via call

## 2024-06-27 NOTE — Telephone Encounter (Signed)
 Pt c/o medication issue:  1. Name of Medication:   dapagliflozin  propanediol (FARXIGA ) 10 MG TABS tablet  sacubitril -valsartan  (ENTRESTO ) 49-51 MG   2. How are you currently taking this medication (dosage and times per day)?   As prescribed  3. Are you having a reaction (difficulty breathing--STAT)?   4. What is your medication issue?   Daughter Francie) wants a call back to reapply for grants to get these medications.

## 2024-06-28 NOTE — Telephone Encounter (Signed)
 Pts daughter states she went to the pharmacy and the say they still haven't received the grant. Please advise.  Daughter is requesting the callback

## 2024-06-29 NOTE — Telephone Encounter (Signed)
 Pt daughter calling again to f/u on this issue. Pharmacy still does not have grant information. Please advise.

## 2024-06-30 ENCOUNTER — Other Ambulatory Visit (HOSPITAL_COMMUNITY): Payer: Self-pay

## 2024-07-20 ENCOUNTER — Encounter: Payer: Self-pay | Admitting: Cardiovascular Disease

## 2024-07-20 ENCOUNTER — Ambulatory Visit: Admitting: Cardiovascular Disease

## 2024-07-20 VITALS — BP 130/70 | HR 70 | Ht 71.0 in | Wt 165.3 lb

## 2024-07-20 DIAGNOSIS — E7841 Elevated Lipoprotein(a): Secondary | ICD-10-CM | POA: Insufficient documentation

## 2024-07-20 DIAGNOSIS — I2510 Atherosclerotic heart disease of native coronary artery without angina pectoris: Secondary | ICD-10-CM

## 2024-07-20 DIAGNOSIS — I10 Essential (primary) hypertension: Secondary | ICD-10-CM | POA: Insufficient documentation

## 2024-07-20 DIAGNOSIS — I5022 Chronic systolic (congestive) heart failure: Secondary | ICD-10-CM | POA: Insufficient documentation

## 2024-07-20 DIAGNOSIS — E7800 Pure hypercholesterolemia, unspecified: Secondary | ICD-10-CM | POA: Insufficient documentation

## 2024-07-20 NOTE — Patient Instructions (Signed)

## 2024-07-20 NOTE — Progress Notes (Signed)
 " Cardiology Office Note   Date:  07/20/2024  ID:  Case Paul [name], DOB 1940/07/02, MRN [id number] PCP: Paul Senior, MD  [facility] Providers Cardiologist:  Jerel Balding, MD     History of Present Illness Paul [name] is a 84 y.o. male with a history of coronary artery disease (s/p CABG 2022 , [place], LIMA-OM1, SVG-OM 2, SVG-PLV, chronic occlusion of LAD with greater than 50% transmural scar of the mid-- apical anterior wall and anteroseptum and apex on MRI), chronic heart failure with mildly reduced ejection fraction (EF recovered from 28% before CABG to 45-50% by echo January 2026), treated hypercholesterolemia.  He had postoperative atrial fibrillation but has not had recurrent arrhythmia since then.  He has chronic stage III kidney disease.  He does not have diabetes mellitus.  He lives independently in [place] and takes care of his own lawn work.  He was able to shovel some snow even the heavy ice that occurred a couple of weeks ago and stopped due to fatigue rather than shortness of breath or chest pain.  He is not troubled by palpitations, dizziness or syncope.  He has not had lower extremity edema, orthopnea or PND.  He has not had any chest pain and his initial presentation with heart disease was with pulmonary edema rather than angina.  In March 2024 he his echocardiogram was interpreted as showing EF of 50-55%, which was probably an overestimation.  He just had another echocardiogram in January 2026 which I think more realistically estimates his EF at 45-50% with a residual apical wall motion abnormality.  He does not take loop diuretics and is on comprehensive guideline directed medical therapy including metoprolol  succinate 12.5 mg daily, sacubitril -valsartan  49-51 mg twice daily, spironolactone  12.5 mg daily, dapagliflozin  10 mg daily.  He takes aspirin  and atorvastatin  and his most recent lipid profile was excellent with an HDL of 50, LDL 52 and normal triglycerides.  A  remote hemoglobin A1c was 6.3%, but has not been checked more recently.  Studies Reviewed EKG Interpretation Date/Time:  Thursday July 20 2024 10:00:07 EST Ventricular Rate:  67 PR Interval:  206 QRS Duration:  92 QT Interval:  402 QTC Calculation: 424 R Axis:   -6  Text Interpretation: Normal sinus rhythm Septal infarct , age undetermined Inferior infarct , age undetermined When compared with ECG of 22-Nov-2023 08:11, No significant change was found Confirmed by Tyrek Lawhorn [phone]) on 07/20/2024 10:12:05 AM    Echocardiogram 06/23/2024  1. Severe hypokinesis of septum/apex inferior hypokinesis . Left  ventricular ejection fraction, by estimation, is 45 to 50%. The left  ventricle has mildly decreased function. The left ventricle demonstrates  regional wall motion abnormalities (see  scoring diagram/findings for description). The left ventricular internal  cavity size was moderately dilated. Left ventricular diastolic parameters  are indeterminate.   2. Right ventricular systolic function is normal. The right ventricular  size is normal.   3. Left atrial size was moderately dilated.   4. The mitral valve is degenerative. Trivial mitral valve regurgitation.  No evidence of mitral stenosis. Moderate mitral annular calcification.   5. The aortic valve is normal in structure. There is moderate  calcification of the aortic valve. There is moderate thickening of the  aortic valve. Aortic valve regurgitation is not visualized. Aortic valve  sclerosis is present, with no evidence of  aortic valve stenosis.   6. Aortic dilatation noted. There is moderate dilatation of the ascending  aorta, measuring 42 mm.   7. The inferior  vena cava is normal in size with greater than 50%  respiratory variability, suggesting right atrial pressure of 3 mmHg.   11/23/2023 Cholesterol 122, HDL 50, LDL 52, triglycerides 108, LP(a) 195.9 Creatinine 1.24, potassium 4.5, ALT 13, TSH 2.400  Risk  Assessment/Calculations           Physical Exam VS:  BP 130/70 (BP Location: Left Arm, Patient Position: Sitting, Cuff Size: Normal)   Pulse 70   Ht 5' 11 (1.803 m)   Wt 165 lb 4.8 oz (75 kg)   SpO2 97%   BMI 23.05 kg/m        Wt Readings from Last 3 Encounters:  07/20/24 165 lb 4.8 oz (75 kg)  11/22/23 167 lb (75.8 kg)  05/25/23 166 lb 3.2 oz (75.4 kg)    GEN: Well nourished, well developed in no acute distress NECK: No JVD; No carotid bruits CARDIAC: RRR, no murmurs, rubs, gallops RESPIRATORY:  Clear to auscultation without rales, wheezing or rhonchi  ABDOMEN: Soft, non-tender, non-distended EXTREMITIES:  No edema; No deformity   ASSESSMENT AND PLAN CAD s/p CABG: Has a scar in the distribution of mid-- distal LAD artery, nonviable, with chronic total occlusion of the LAD.  Has bypasses to the OM1 (LIMA) and OM 2 and R-PLV (SVGs).  He does not have angina pectoris despite being physically active.  Current medications include aspirin , lipid-lowering agent and beta-blocker. HFmrEF: Despite the LAD distribution scar has had excellent improvement of LV function following revascularization with an ejection fraction that is now approximately 45-50%.  On appropriate guideline directed medical therapy for heart failure that includes sacubitril -valsartan , metoprolol  succinate, spironolactone  and Farxiga .  He does not require loop diuretics.  Clinically euvolemic, NYHA functional class I.  Had a normal NT proBNP in June 2025 at 429.  Continue to avoid sodium rich foods and monitor weights daily.  We reviewed the potential side effects of the heart failure medications including acute kidney injury if taken while hypovolemic, fungal infections, Fournier's gangrene. HTN: On heart failure medications his blood pressure is in optimal range. HLP: All lipid parameters are in target range on current dose of atorvastatin .  Continue.  Has elevated LP(a), but at this point it does not appear to be a  reason to switch to a different lipid-lowering method, just shoot for LDL cholesterol less than 55.       Dispo: Follow-up in 1 year.  Signed, Jerel Balding, MD   "
